# Patient Record
Sex: Female | Born: 1945 | Race: Black or African American | Hispanic: No | Smoking: Never smoker
Health system: Southern US, Community
[De-identification: ages and names within clinical notes are randomized; demographics above are authoritative.]

## PROBLEM LIST (undated history)

## (undated) DIAGNOSIS — G4733 Obstructive sleep apnea (adult) (pediatric): Secondary | ICD-10-CM

## (undated) DIAGNOSIS — T7840XA Allergy, unspecified, initial encounter: Secondary | ICD-10-CM

## (undated) DIAGNOSIS — C8290 Follicular lymphoma, unspecified, unspecified site: Secondary | ICD-10-CM

## (undated) DIAGNOSIS — I10 Essential (primary) hypertension: Secondary | ICD-10-CM

## (undated) DIAGNOSIS — G4730 Sleep apnea, unspecified: Secondary | ICD-10-CM

## (undated) DIAGNOSIS — D869 Sarcoidosis, unspecified: Secondary | ICD-10-CM

## (undated) DIAGNOSIS — H269 Unspecified cataract: Secondary | ICD-10-CM

## (undated) HISTORY — DX: Allergy, unspecified, initial encounter: T78.40XA

## (undated) HISTORY — DX: Unspecified cataract: H26.9

## (undated) HISTORY — DX: Sleep apnea, unspecified: G47.30

## (undated) HISTORY — DX: Obstructive sleep apnea (adult) (pediatric): G47.33

## (undated) HISTORY — DX: Follicular lymphoma, unspecified, unspecified site: C82.90

## (undated) HISTORY — DX: Sarcoidosis, unspecified: D86.9

## (undated) HISTORY — PX: ABDOMINAL HYSTERECTOMY: SHX81

## (undated) HISTORY — PX: LYMPH NODE BIOPSY: SHX201

## (undated) HISTORY — DX: Essential (primary) hypertension: I10

## (undated) HISTORY — PX: CARPAL TUNNEL RELEASE: SHX101

---

## 1976-06-22 HISTORY — PX: TUBAL LIGATION: SHX77

## 1981-06-22 HISTORY — PX: ABDOMINAL HYSTERECTOMY: SUR658

## 2000-11-24 ENCOUNTER — Encounter: Admission: RE | Admit: 2000-11-24 | Discharge: 2000-11-24 | Payer: Self-pay | Admitting: Family Medicine

## 2000-11-24 ENCOUNTER — Encounter: Payer: Self-pay | Admitting: Family Medicine

## 2001-02-08 ENCOUNTER — Other Ambulatory Visit: Admission: RE | Admit: 2001-02-08 | Discharge: 2001-02-08 | Payer: Self-pay | Admitting: Family Medicine

## 2001-02-23 ENCOUNTER — Other Ambulatory Visit: Admission: RE | Admit: 2001-02-23 | Discharge: 2001-02-23 | Payer: Self-pay | Admitting: *Deleted

## 2001-03-23 ENCOUNTER — Encounter (INDEPENDENT_AMBULATORY_CARE_PROVIDER_SITE_OTHER): Payer: Self-pay

## 2001-03-23 ENCOUNTER — Other Ambulatory Visit: Admission: RE | Admit: 2001-03-23 | Discharge: 2001-03-23 | Payer: Self-pay | Admitting: Gastroenterology

## 2002-01-12 ENCOUNTER — Encounter: Payer: Self-pay | Admitting: Family Medicine

## 2002-01-12 ENCOUNTER — Encounter: Admission: RE | Admit: 2002-01-12 | Discharge: 2002-01-12 | Payer: Self-pay | Admitting: Family Medicine

## 2002-08-14 ENCOUNTER — Encounter: Admission: RE | Admit: 2002-08-14 | Discharge: 2002-08-14 | Payer: Self-pay | Admitting: Family Medicine

## 2002-08-14 ENCOUNTER — Encounter: Payer: Self-pay | Admitting: Family Medicine

## 2002-12-28 ENCOUNTER — Encounter: Admission: RE | Admit: 2002-12-28 | Discharge: 2002-12-28 | Payer: Self-pay | Admitting: Family Medicine

## 2002-12-28 ENCOUNTER — Encounter: Payer: Self-pay | Admitting: Family Medicine

## 2003-06-06 ENCOUNTER — Encounter: Admission: RE | Admit: 2003-06-06 | Discharge: 2003-06-06 | Payer: Self-pay | Admitting: Family Medicine

## 2003-06-06 ENCOUNTER — Ambulatory Visit (HOSPITAL_BASED_OUTPATIENT_CLINIC_OR_DEPARTMENT_OTHER): Admission: RE | Admit: 2003-06-06 | Discharge: 2003-06-06 | Payer: Self-pay | Admitting: Family Medicine

## 2003-06-23 HISTORY — PX: OTHER SURGICAL HISTORY: SHX169

## 2003-12-20 ENCOUNTER — Encounter: Admission: RE | Admit: 2003-12-20 | Discharge: 2003-12-20 | Payer: Self-pay | Admitting: Family Medicine

## 2003-12-22 ENCOUNTER — Encounter: Admission: RE | Admit: 2003-12-22 | Discharge: 2003-12-22 | Payer: Self-pay | Admitting: Family Medicine

## 2004-01-04 ENCOUNTER — Encounter: Admission: RE | Admit: 2004-01-04 | Discharge: 2004-01-04 | Payer: Self-pay | Admitting: Family Medicine

## 2004-01-11 ENCOUNTER — Encounter: Admission: RE | Admit: 2004-01-11 | Discharge: 2004-01-11 | Payer: Self-pay | Admitting: Family Medicine

## 2004-01-22 ENCOUNTER — Ambulatory Visit (HOSPITAL_COMMUNITY): Admission: RE | Admit: 2004-01-22 | Discharge: 2004-01-22 | Payer: Self-pay | Admitting: General Surgery

## 2004-01-22 ENCOUNTER — Encounter (INDEPENDENT_AMBULATORY_CARE_PROVIDER_SITE_OTHER): Payer: Self-pay | Admitting: Specialist

## 2004-01-22 ENCOUNTER — Other Ambulatory Visit: Admission: RE | Admit: 2004-01-22 | Discharge: 2004-01-22 | Payer: Self-pay | Admitting: Hematology and Oncology

## 2004-01-31 ENCOUNTER — Ambulatory Visit (HOSPITAL_COMMUNITY): Admission: RE | Admit: 2004-01-31 | Discharge: 2004-01-31 | Payer: Self-pay | Admitting: Hematology and Oncology

## 2004-02-15 ENCOUNTER — Encounter: Admission: RE | Admit: 2004-02-15 | Discharge: 2004-02-15 | Payer: Self-pay | Admitting: Hematology and Oncology

## 2004-02-29 ENCOUNTER — Encounter (INDEPENDENT_AMBULATORY_CARE_PROVIDER_SITE_OTHER): Payer: Self-pay | Admitting: General Surgery

## 2004-02-29 ENCOUNTER — Encounter (INDEPENDENT_AMBULATORY_CARE_PROVIDER_SITE_OTHER): Payer: Self-pay | Admitting: *Deleted

## 2004-02-29 ENCOUNTER — Ambulatory Visit (HOSPITAL_COMMUNITY): Admission: RE | Admit: 2004-02-29 | Discharge: 2004-02-29 | Payer: Self-pay | Admitting: General Surgery

## 2004-02-29 ENCOUNTER — Ambulatory Visit (HOSPITAL_BASED_OUTPATIENT_CLINIC_OR_DEPARTMENT_OTHER): Admission: RE | Admit: 2004-02-29 | Discharge: 2004-02-29 | Payer: Self-pay | Admitting: General Surgery

## 2004-03-06 ENCOUNTER — Ambulatory Visit (HOSPITAL_COMMUNITY): Admission: RE | Admit: 2004-03-06 | Discharge: 2004-03-06 | Payer: Self-pay | Admitting: Hematology and Oncology

## 2004-03-07 ENCOUNTER — Other Ambulatory Visit: Admission: RE | Admit: 2004-03-07 | Discharge: 2004-03-07 | Payer: Self-pay | Admitting: Hematology and Oncology

## 2004-03-07 ENCOUNTER — Encounter (INDEPENDENT_AMBULATORY_CARE_PROVIDER_SITE_OTHER): Payer: Self-pay | Admitting: *Deleted

## 2004-03-10 ENCOUNTER — Ambulatory Visit (HOSPITAL_COMMUNITY): Admission: RE | Admit: 2004-03-10 | Discharge: 2004-03-10 | Payer: Self-pay | Admitting: Oncology

## 2004-04-25 ENCOUNTER — Ambulatory Visit: Payer: Self-pay | Admitting: Hematology and Oncology

## 2004-05-05 ENCOUNTER — Emergency Department (HOSPITAL_COMMUNITY): Admission: EM | Admit: 2004-05-05 | Discharge: 2004-05-05 | Payer: Self-pay | Admitting: Emergency Medicine

## 2004-06-10 ENCOUNTER — Ambulatory Visit: Payer: Self-pay | Admitting: Hematology and Oncology

## 2004-06-22 HISTORY — PX: PORT-A-CATH REMOVAL: SHX5289

## 2004-07-22 ENCOUNTER — Ambulatory Visit (HOSPITAL_COMMUNITY): Admission: RE | Admit: 2004-07-22 | Discharge: 2004-07-22 | Payer: Self-pay | Admitting: Hematology and Oncology

## 2004-08-08 ENCOUNTER — Ambulatory Visit: Payer: Self-pay | Admitting: Family Medicine

## 2004-08-20 ENCOUNTER — Ambulatory Visit: Payer: Self-pay | Admitting: Family Medicine

## 2004-08-20 ENCOUNTER — Ambulatory Visit: Payer: Self-pay | Admitting: Gastroenterology

## 2004-08-20 ENCOUNTER — Other Ambulatory Visit: Admission: RE | Admit: 2004-08-20 | Discharge: 2004-08-20 | Payer: Self-pay | Admitting: Family Medicine

## 2004-08-21 ENCOUNTER — Ambulatory Visit: Payer: Self-pay | Admitting: Hematology and Oncology

## 2004-09-02 ENCOUNTER — Ambulatory Visit: Payer: Self-pay | Admitting: Gastroenterology

## 2004-10-14 ENCOUNTER — Ambulatory Visit: Payer: Self-pay | Admitting: Hematology and Oncology

## 2004-10-17 ENCOUNTER — Ambulatory Visit (HOSPITAL_COMMUNITY): Admission: RE | Admit: 2004-10-17 | Discharge: 2004-10-17 | Payer: Self-pay | Admitting: Hematology and Oncology

## 2004-10-31 ENCOUNTER — Ambulatory Visit: Payer: Self-pay | Admitting: Psychology

## 2004-12-08 ENCOUNTER — Ambulatory Visit: Payer: Self-pay | Admitting: Family Medicine

## 2004-12-25 ENCOUNTER — Ambulatory Visit: Payer: Self-pay | Admitting: Hematology and Oncology

## 2005-02-16 ENCOUNTER — Encounter: Admission: RE | Admit: 2005-02-16 | Discharge: 2005-02-16 | Payer: Self-pay | Admitting: Hematology and Oncology

## 2005-02-25 ENCOUNTER — Ambulatory Visit (HOSPITAL_COMMUNITY): Admission: RE | Admit: 2005-02-25 | Discharge: 2005-02-25 | Payer: Self-pay | Admitting: Hematology and Oncology

## 2005-02-27 ENCOUNTER — Ambulatory Visit: Payer: Self-pay | Admitting: Hematology and Oncology

## 2005-03-04 ENCOUNTER — Ambulatory Visit (HOSPITAL_COMMUNITY): Admission: RE | Admit: 2005-03-04 | Discharge: 2005-03-04 | Payer: Self-pay | Admitting: Oncology

## 2005-03-13 ENCOUNTER — Encounter: Admission: RE | Admit: 2005-03-13 | Discharge: 2005-03-13 | Payer: Self-pay | Admitting: Hematology and Oncology

## 2005-04-02 ENCOUNTER — Encounter (INDEPENDENT_AMBULATORY_CARE_PROVIDER_SITE_OTHER): Payer: Self-pay | Admitting: Specialist

## 2005-04-02 ENCOUNTER — Ambulatory Visit (HOSPITAL_COMMUNITY): Admission: RE | Admit: 2005-04-02 | Discharge: 2005-04-02 | Payer: Self-pay | Admitting: Thoracic Surgery

## 2005-04-13 ENCOUNTER — Ambulatory Visit: Payer: Self-pay | Admitting: Family Medicine

## 2005-04-22 ENCOUNTER — Ambulatory Visit: Payer: Self-pay | Admitting: Hematology and Oncology

## 2005-05-07 ENCOUNTER — Ambulatory Visit: Payer: Self-pay | Admitting: Critical Care Medicine

## 2005-05-18 ENCOUNTER — Ambulatory Visit: Payer: Self-pay | Admitting: Critical Care Medicine

## 2005-06-25 ENCOUNTER — Ambulatory Visit: Payer: Self-pay | Admitting: Hematology and Oncology

## 2005-06-26 ENCOUNTER — Ambulatory Visit: Payer: Self-pay | Admitting: Critical Care Medicine

## 2005-07-13 ENCOUNTER — Ambulatory Visit (HOSPITAL_COMMUNITY): Admission: RE | Admit: 2005-07-13 | Discharge: 2005-07-13 | Payer: Self-pay | Admitting: Hematology and Oncology

## 2005-08-17 ENCOUNTER — Ambulatory Visit (HOSPITAL_COMMUNITY): Admission: RE | Admit: 2005-08-17 | Discharge: 2005-08-17 | Payer: Self-pay | Admitting: Hematology and Oncology

## 2005-10-23 ENCOUNTER — Ambulatory Visit: Payer: Self-pay | Admitting: Critical Care Medicine

## 2005-10-27 ENCOUNTER — Ambulatory Visit: Payer: Self-pay | Admitting: Family Medicine

## 2005-11-04 ENCOUNTER — Ambulatory Visit: Payer: Self-pay | Admitting: Hematology and Oncology

## 2005-11-09 ENCOUNTER — Ambulatory Visit (HOSPITAL_COMMUNITY): Admission: RE | Admit: 2005-11-09 | Discharge: 2005-11-09 | Payer: Self-pay | Admitting: Hematology and Oncology

## 2005-11-18 ENCOUNTER — Ambulatory Visit: Payer: Self-pay | Admitting: Family Medicine

## 2006-03-01 ENCOUNTER — Ambulatory Visit: Payer: Self-pay | Admitting: Hematology and Oncology

## 2006-03-03 LAB — CBC WITH DIFFERENTIAL/PLATELET
BASO%: 1.2 % (ref 0.0–2.0)
Eosinophils Absolute: 0.1 10*3/uL (ref 0.0–0.5)
MONO#: 0.4 10*3/uL (ref 0.1–0.9)
MONO%: 7.7 % (ref 0.0–13.0)
NEUT#: 2.4 10*3/uL (ref 1.5–6.5)
RBC: 4.48 10*6/uL (ref 3.70–5.32)
RDW: 14.2 % (ref 11.3–14.5)
WBC: 5.7 10*3/uL (ref 3.9–10.0)

## 2006-03-03 LAB — COMPREHENSIVE METABOLIC PANEL
AST: 17 U/L (ref 0–37)
Albumin: 4.4 g/dL (ref 3.5–5.2)
Alkaline Phosphatase: 73 U/L (ref 39–117)
BUN: 10 mg/dL (ref 6–23)
Potassium: 4.1 mEq/L (ref 3.5–5.3)
Sodium: 141 mEq/L (ref 135–145)
Total Bilirubin: 0.6 mg/dL (ref 0.3–1.2)

## 2006-03-05 ENCOUNTER — Ambulatory Visit (HOSPITAL_COMMUNITY): Admission: RE | Admit: 2006-03-05 | Discharge: 2006-03-05 | Payer: Self-pay | Admitting: Hematology and Oncology

## 2006-03-29 ENCOUNTER — Ambulatory Visit: Payer: Self-pay | Admitting: Critical Care Medicine

## 2006-04-15 ENCOUNTER — Ambulatory Visit: Payer: Self-pay | Admitting: Family Medicine

## 2006-05-04 ENCOUNTER — Encounter: Admission: RE | Admit: 2006-05-04 | Discharge: 2006-05-04 | Payer: Self-pay | Admitting: Hematology and Oncology

## 2006-08-13 ENCOUNTER — Encounter: Admission: RE | Admit: 2006-08-13 | Discharge: 2006-08-13 | Payer: Self-pay | Admitting: Rheumatology

## 2006-08-31 ENCOUNTER — Ambulatory Visit: Payer: Self-pay | Admitting: Hematology and Oncology

## 2006-09-03 LAB — CBC WITH DIFFERENTIAL/PLATELET
Basophils Absolute: 0.1 10*3/uL (ref 0.0–0.1)
Eosinophils Absolute: 0.1 10*3/uL (ref 0.0–0.5)
HCT: 36.2 % (ref 34.8–46.6)
HGB: 12 g/dL (ref 11.6–15.9)
LYMPH%: 45.5 % (ref 14.0–48.0)
MCV: 79.4 fL — ABNORMAL LOW (ref 81.0–101.0)
MONO%: 7.5 % (ref 0.0–13.0)
NEUT#: 2.9 10*3/uL (ref 1.5–6.5)
NEUT%: 43.9 % (ref 39.6–76.8)
Platelets: 408 10*3/uL — ABNORMAL HIGH (ref 145–400)
RBC: 4.56 10*6/uL (ref 3.70–5.32)

## 2006-09-03 LAB — COMPREHENSIVE METABOLIC PANEL
Alkaline Phosphatase: 75 U/L (ref 39–117)
BUN: 8 mg/dL (ref 6–23)
CO2: 26 mEq/L (ref 19–32)
Creatinine, Ser: 0.81 mg/dL (ref 0.40–1.20)
Glucose, Bld: 117 mg/dL — ABNORMAL HIGH (ref 70–99)
Total Bilirubin: 0.4 mg/dL (ref 0.3–1.2)

## 2006-09-03 LAB — LACTATE DEHYDROGENASE: LDH: 190 U/L (ref 94–250)

## 2006-09-07 ENCOUNTER — Ambulatory Visit (HOSPITAL_COMMUNITY): Admission: RE | Admit: 2006-09-07 | Discharge: 2006-09-07 | Payer: Self-pay | Admitting: Hematology and Oncology

## 2006-09-28 ENCOUNTER — Ambulatory Visit (HOSPITAL_COMMUNITY): Admission: RE | Admit: 2006-09-28 | Discharge: 2006-09-29 | Payer: Self-pay | Admitting: Orthopaedic Surgery

## 2006-11-18 ENCOUNTER — Ambulatory Visit: Payer: Self-pay | Admitting: Family Medicine

## 2006-11-19 DIAGNOSIS — I10 Essential (primary) hypertension: Secondary | ICD-10-CM | POA: Insufficient documentation

## 2006-11-19 DIAGNOSIS — G4733 Obstructive sleep apnea (adult) (pediatric): Secondary | ICD-10-CM

## 2006-11-30 ENCOUNTER — Telehealth: Payer: Self-pay | Admitting: Family Medicine

## 2007-02-25 ENCOUNTER — Ambulatory Visit: Payer: Self-pay | Admitting: Hematology and Oncology

## 2007-03-01 LAB — CBC WITH DIFFERENTIAL/PLATELET
BASO%: 0.7 % (ref 0.0–2.0)
Basophils Absolute: 0 10*3/uL (ref 0.0–0.1)
EOS%: 2 % (ref 0.0–7.0)
Eosinophils Absolute: 0.1 10*3/uL (ref 0.0–0.5)
HCT: 34.5 % — ABNORMAL LOW (ref 34.8–46.6)
HGB: 11.6 g/dL (ref 11.6–15.9)
LYMPH%: 47.4 % (ref 14.0–48.0)
MCH: 27.2 pg (ref 26.0–34.0)
MCHC: 33.8 g/dL (ref 32.0–36.0)
MCV: 80.5 fL — ABNORMAL LOW (ref 81.0–101.0)
MONO#: 0.5 10*3/uL (ref 0.1–0.9)
MONO%: 7.6 % (ref 0.0–13.0)
NEUT#: 2.5 10*3/uL (ref 1.5–6.5)
NEUT%: 42.3 % (ref 39.6–76.8)
Platelets: 353 10*3/uL (ref 145–400)
RBC: 4.28 10*6/uL (ref 3.70–5.32)
RDW: 13.8 % (ref 11.3–14.5)
WBC: 6 10*3/uL (ref 3.9–10.0)
lymph#: 2.8 10*3/uL (ref 0.9–3.3)

## 2007-03-01 LAB — COMPREHENSIVE METABOLIC PANEL
AST: 20 U/L (ref 0–37)
BUN: 11 mg/dL (ref 6–23)
Calcium: 8.9 mg/dL (ref 8.4–10.5)
Chloride: 105 mEq/L (ref 96–112)
Creatinine, Ser: 0.77 mg/dL (ref 0.40–1.20)
Total Bilirubin: 0.5 mg/dL (ref 0.3–1.2)

## 2007-03-01 LAB — LACTATE DEHYDROGENASE: LDH: 199 U/L (ref 94–250)

## 2007-03-04 ENCOUNTER — Ambulatory Visit (HOSPITAL_COMMUNITY): Admission: RE | Admit: 2007-03-04 | Discharge: 2007-03-04 | Payer: Self-pay | Admitting: Hematology and Oncology

## 2007-03-09 ENCOUNTER — Encounter: Payer: Self-pay | Admitting: Family Medicine

## 2007-05-06 ENCOUNTER — Encounter: Payer: Self-pay | Admitting: Family Medicine

## 2007-05-06 ENCOUNTER — Encounter: Admission: RE | Admit: 2007-05-06 | Discharge: 2007-05-06 | Payer: Self-pay | Admitting: Hematology and Oncology

## 2007-05-18 ENCOUNTER — Encounter (INDEPENDENT_AMBULATORY_CARE_PROVIDER_SITE_OTHER): Payer: Self-pay | Admitting: *Deleted

## 2007-06-01 DIAGNOSIS — D869 Sarcoidosis, unspecified: Secondary | ICD-10-CM

## 2007-06-01 DIAGNOSIS — C8590 Non-Hodgkin lymphoma, unspecified, unspecified site: Secondary | ICD-10-CM | POA: Insufficient documentation

## 2007-06-01 DIAGNOSIS — Z87898 Personal history of other specified conditions: Secondary | ICD-10-CM

## 2007-09-02 ENCOUNTER — Ambulatory Visit: Payer: Self-pay | Admitting: Hematology and Oncology

## 2007-09-02 ENCOUNTER — Ambulatory Visit (HOSPITAL_COMMUNITY): Admission: RE | Admit: 2007-09-02 | Discharge: 2007-09-02 | Payer: Self-pay | Admitting: Hematology and Oncology

## 2007-09-02 LAB — LACTATE DEHYDROGENASE: LDH: 182 U/L (ref 94–250)

## 2007-09-02 LAB — CBC WITH DIFFERENTIAL/PLATELET
Basophils Absolute: 0 10*3/uL (ref 0.0–0.1)
Eosinophils Absolute: 0 10*3/uL (ref 0.0–0.5)
HGB: 13.3 g/dL (ref 11.6–15.9)
MCV: 79.6 fL — ABNORMAL LOW (ref 81.0–101.0)
MONO%: 0.7 % (ref 0.0–13.0)
NEUT#: 6 10*3/uL (ref 1.5–6.5)
RDW: 14.7 % — ABNORMAL HIGH (ref 11.3–14.5)
lymph#: 1.6 10*3/uL (ref 0.9–3.3)

## 2007-09-02 LAB — COMPREHENSIVE METABOLIC PANEL
Albumin: 4.2 g/dL (ref 3.5–5.2)
BUN: 12 mg/dL (ref 6–23)
CO2: 29 mEq/L (ref 19–32)
Calcium: 9.7 mg/dL (ref 8.4–10.5)
Chloride: 103 mEq/L (ref 96–112)
Glucose, Bld: 131 mg/dL — ABNORMAL HIGH (ref 70–99)
Potassium: 4.1 mEq/L (ref 3.5–5.3)

## 2007-09-06 ENCOUNTER — Encounter: Payer: Self-pay | Admitting: Critical Care Medicine

## 2007-09-06 ENCOUNTER — Encounter: Payer: Self-pay | Admitting: Family Medicine

## 2007-09-16 ENCOUNTER — Ambulatory Visit: Payer: Self-pay | Admitting: Family Medicine

## 2007-09-16 ENCOUNTER — Other Ambulatory Visit: Admission: RE | Admit: 2007-09-16 | Discharge: 2007-09-16 | Payer: Self-pay | Admitting: Family Medicine

## 2007-09-16 ENCOUNTER — Encounter: Payer: Self-pay | Admitting: Family Medicine

## 2007-09-16 DIAGNOSIS — Z9889 Other specified postprocedural states: Secondary | ICD-10-CM

## 2007-09-16 DIAGNOSIS — Z8719 Personal history of other diseases of the digestive system: Secondary | ICD-10-CM

## 2007-09-16 DIAGNOSIS — N959 Unspecified menopausal and perimenopausal disorder: Secondary | ICD-10-CM | POA: Insufficient documentation

## 2007-09-16 LAB — CONVERTED CEMR LAB
Nitrite: NEGATIVE
Specific Gravity, Urine: 1.025
WBC Urine, dipstick: NEGATIVE

## 2007-09-21 ENCOUNTER — Encounter (INDEPENDENT_AMBULATORY_CARE_PROVIDER_SITE_OTHER): Payer: Self-pay | Admitting: *Deleted

## 2007-09-22 ENCOUNTER — Telehealth (INDEPENDENT_AMBULATORY_CARE_PROVIDER_SITE_OTHER): Payer: Self-pay | Admitting: *Deleted

## 2007-09-27 LAB — CONVERTED CEMR LAB
ALT: 26 units/L (ref 0–35)
Albumin: 4.1 g/dL (ref 3.5–5.2)
Alkaline Phosphatase: 76 units/L (ref 39–117)
BUN: 6 mg/dL (ref 6–23)
Calcium: 9.6 mg/dL (ref 8.4–10.5)
Eosinophils Relative: 1.5 % (ref 0.0–5.0)
GFR calc Af Amer: 94 mL/min
Glucose, Bld: 93 mg/dL (ref 70–99)
HCT: 37.6 % (ref 36.0–46.0)
Hemoglobin: 12.4 g/dL (ref 12.0–15.0)
Monocytes Absolute: 0.5 10*3/uL (ref 0.1–1.0)
Monocytes Relative: 6.3 % (ref 3.0–12.0)
Neutro Abs: 3.2 10*3/uL (ref 1.4–7.7)
Potassium: 3.8 meq/L (ref 3.5–5.1)
Total CHOL/HDL Ratio: 3.5
Total Protein: 7.5 g/dL (ref 6.0–8.3)
WBC: 7.4 10*3/uL (ref 4.5–10.5)

## 2007-09-28 ENCOUNTER — Encounter (INDEPENDENT_AMBULATORY_CARE_PROVIDER_SITE_OTHER): Payer: Self-pay | Admitting: *Deleted

## 2007-10-04 ENCOUNTER — Encounter: Payer: Self-pay | Admitting: Family Medicine

## 2007-10-04 ENCOUNTER — Encounter: Admission: RE | Admit: 2007-10-04 | Discharge: 2007-10-04 | Payer: Self-pay | Admitting: Family Medicine

## 2007-10-04 ENCOUNTER — Telehealth (INDEPENDENT_AMBULATORY_CARE_PROVIDER_SITE_OTHER): Payer: Self-pay | Admitting: *Deleted

## 2007-10-05 ENCOUNTER — Encounter (INDEPENDENT_AMBULATORY_CARE_PROVIDER_SITE_OTHER): Payer: Self-pay | Admitting: *Deleted

## 2007-10-14 ENCOUNTER — Ambulatory Visit: Payer: Self-pay | Admitting: Gastroenterology

## 2007-10-17 ENCOUNTER — Telehealth: Payer: Self-pay | Admitting: Gastroenterology

## 2008-03-05 ENCOUNTER — Ambulatory Visit: Payer: Self-pay | Admitting: Hematology and Oncology

## 2008-03-07 ENCOUNTER — Ambulatory Visit (HOSPITAL_COMMUNITY): Admission: RE | Admit: 2008-03-07 | Discharge: 2008-03-07 | Payer: Self-pay | Admitting: Hematology and Oncology

## 2008-03-07 LAB — COMPREHENSIVE METABOLIC PANEL
ALT: 34 U/L (ref 0–35)
AST: 31 U/L (ref 0–37)
Albumin: 4.2 g/dL (ref 3.5–5.2)
BUN: 10 mg/dL (ref 6–23)
CO2: 28 mEq/L (ref 19–32)
Calcium: 9.8 mg/dL (ref 8.4–10.5)
Chloride: 103 mEq/L (ref 96–112)
Potassium: 4 mEq/L (ref 3.5–5.3)

## 2008-03-07 LAB — CBC WITH DIFFERENTIAL/PLATELET
BASO%: 0.1 % (ref 0.0–2.0)
Basophils Absolute: 0 10*3/uL (ref 0.0–0.1)
EOS%: 0.1 % (ref 0.0–7.0)
HCT: 39.8 % (ref 34.8–46.6)
HGB: 13 g/dL (ref 11.6–15.9)
MCH: 26.8 pg (ref 26.0–34.0)
MONO#: 0.1 10*3/uL (ref 0.1–0.9)
NEUT%: 80.6 % — ABNORMAL HIGH (ref 39.6–76.8)
RDW: 15.1 % — ABNORMAL HIGH (ref 11.3–14.5)
WBC: 8.8 10*3/uL (ref 3.9–10.0)
lymph#: 1.6 10*3/uL (ref 0.9–3.3)

## 2008-03-14 ENCOUNTER — Encounter: Payer: Self-pay | Admitting: Critical Care Medicine

## 2008-04-06 ENCOUNTER — Ambulatory Visit: Payer: Self-pay | Admitting: Family Medicine

## 2008-04-06 DIAGNOSIS — J069 Acute upper respiratory infection, unspecified: Secondary | ICD-10-CM | POA: Insufficient documentation

## 2008-05-11 ENCOUNTER — Telehealth (INDEPENDENT_AMBULATORY_CARE_PROVIDER_SITE_OTHER): Payer: Self-pay | Admitting: *Deleted

## 2008-06-19 ENCOUNTER — Encounter: Admission: RE | Admit: 2008-06-19 | Discharge: 2008-06-19 | Payer: Self-pay | Admitting: Family Medicine

## 2008-06-20 ENCOUNTER — Encounter (INDEPENDENT_AMBULATORY_CARE_PROVIDER_SITE_OTHER): Payer: Self-pay | Admitting: *Deleted

## 2008-08-01 ENCOUNTER — Telehealth (INDEPENDENT_AMBULATORY_CARE_PROVIDER_SITE_OTHER): Payer: Self-pay | Admitting: *Deleted

## 2008-09-04 ENCOUNTER — Ambulatory Visit: Payer: Self-pay | Admitting: Hematology and Oncology

## 2008-09-06 ENCOUNTER — Ambulatory Visit (HOSPITAL_COMMUNITY): Admission: RE | Admit: 2008-09-06 | Discharge: 2008-09-06 | Payer: Self-pay | Admitting: Hematology and Oncology

## 2008-09-06 LAB — COMPREHENSIVE METABOLIC PANEL
ALT: 41 U/L — ABNORMAL HIGH (ref 0–35)
BUN: 8 mg/dL (ref 6–23)
CO2: 27 mEq/L (ref 19–32)
Calcium: 9.8 mg/dL (ref 8.4–10.5)
Chloride: 102 mEq/L (ref 96–112)
Creatinine, Ser: 0.76 mg/dL (ref 0.40–1.20)
Glucose, Bld: 178 mg/dL — ABNORMAL HIGH (ref 70–99)
Total Bilirubin: 0.7 mg/dL (ref 0.3–1.2)

## 2008-09-06 LAB — CBC WITH DIFFERENTIAL/PLATELET
Basophils Absolute: 0 10*3/uL (ref 0.0–0.1)
Eosinophils Absolute: 0 10*3/uL (ref 0.0–0.5)
HCT: 40.9 % (ref 34.8–46.6)
HGB: 13.3 g/dL (ref 11.6–15.9)
LYMPH%: 20.5 % (ref 14.0–49.7)
MCHC: 32.4 g/dL (ref 31.5–36.0)
MONO#: 0 10*3/uL — ABNORMAL LOW (ref 0.1–0.9)
NEUT#: 6.2 10*3/uL (ref 1.5–6.5)
NEUT%: 79.1 % — ABNORMAL HIGH (ref 38.4–76.8)
Platelets: 346 10*3/uL (ref 145–400)
WBC: 7.9 10*3/uL (ref 3.9–10.3)
lymph#: 1.6 10*3/uL (ref 0.9–3.3)

## 2008-09-06 LAB — LACTATE DEHYDROGENASE: LDH: 192 U/L (ref 94–250)

## 2008-09-13 ENCOUNTER — Encounter: Payer: Self-pay | Admitting: Critical Care Medicine

## 2008-11-21 ENCOUNTER — Ambulatory Visit: Payer: Self-pay | Admitting: Family Medicine

## 2008-11-21 DIAGNOSIS — L259 Unspecified contact dermatitis, unspecified cause: Secondary | ICD-10-CM

## 2009-02-18 ENCOUNTER — Telehealth (INDEPENDENT_AMBULATORY_CARE_PROVIDER_SITE_OTHER): Payer: Self-pay | Admitting: *Deleted

## 2009-03-26 ENCOUNTER — Ambulatory Visit: Payer: Self-pay | Admitting: Family Medicine

## 2009-03-26 ENCOUNTER — Encounter: Payer: Self-pay | Admitting: Family Medicine

## 2009-03-26 ENCOUNTER — Other Ambulatory Visit: Admission: RE | Admit: 2009-03-26 | Discharge: 2009-03-26 | Payer: Self-pay | Admitting: Family Medicine

## 2009-03-26 DIAGNOSIS — R5383 Other fatigue: Secondary | ICD-10-CM

## 2009-03-26 DIAGNOSIS — R5381 Other malaise: Secondary | ICD-10-CM | POA: Insufficient documentation

## 2009-04-01 ENCOUNTER — Telehealth (INDEPENDENT_AMBULATORY_CARE_PROVIDER_SITE_OTHER): Payer: Self-pay | Admitting: *Deleted

## 2009-04-01 LAB — CONVERTED CEMR LAB
ALT: 41 units/L — ABNORMAL HIGH (ref 0–35)
AST: 35 units/L (ref 0–37)
Albumin: 4 g/dL (ref 3.5–5.2)
Alkaline Phosphatase: 74 units/L (ref 39–117)
Basophils Relative: 3.9 % — ABNORMAL HIGH (ref 0.0–3.0)
Eosinophils Relative: 2.6 % (ref 0.0–5.0)
Folate: 10.1 ng/mL
GFR calc non Af Amer: 81.7 mL/min (ref >60)
Glucose, Bld: 93 mg/dL (ref 70–99)
HCT: 36.7 % (ref 36.0–46.0)
Hemoglobin: 12.3 g/dL (ref 12.0–15.0)
LDL Cholesterol: 105 mg/dL — ABNORMAL HIGH (ref 0–99)
Lymphocytes Relative: 49.8 % — ABNORMAL HIGH (ref 12.0–46.0)
Lymphs Abs: 2.4 10*3/uL (ref 0.7–4.0)
Monocytes Relative: 6.5 % (ref 3.0–12.0)
Neutro Abs: 1.8 10*3/uL (ref 1.4–7.7)
Potassium: 4.1 meq/L (ref 3.5–5.1)
RBC: 4.59 M/uL (ref 3.87–5.11)
Sodium: 140 meq/L (ref 135–145)
TSH: 1.24 microintl units/mL (ref 0.35–5.50)
Total CHOL/HDL Ratio: 3
VLDL: 17.2 mg/dL (ref 0.0–40.0)
WBC: 4.8 10*3/uL (ref 4.5–10.5)

## 2009-04-05 ENCOUNTER — Telehealth: Payer: Self-pay | Admitting: Family Medicine

## 2009-04-12 ENCOUNTER — Telehealth (INDEPENDENT_AMBULATORY_CARE_PROVIDER_SITE_OTHER): Payer: Self-pay | Admitting: *Deleted

## 2009-04-12 ENCOUNTER — Ambulatory Visit: Payer: Self-pay | Admitting: Family Medicine

## 2009-04-19 ENCOUNTER — Ambulatory Visit: Payer: Self-pay | Admitting: Family Medicine

## 2009-04-19 DIAGNOSIS — E538 Deficiency of other specified B group vitamins: Secondary | ICD-10-CM

## 2009-04-26 ENCOUNTER — Ambulatory Visit: Payer: Self-pay | Admitting: Family Medicine

## 2009-05-03 ENCOUNTER — Ambulatory Visit: Payer: Self-pay | Admitting: Family Medicine

## 2009-05-27 ENCOUNTER — Telehealth (INDEPENDENT_AMBULATORY_CARE_PROVIDER_SITE_OTHER): Payer: Self-pay | Admitting: *Deleted

## 2009-05-28 ENCOUNTER — Ambulatory Visit: Payer: Self-pay | Admitting: Hematology and Oncology

## 2009-05-30 ENCOUNTER — Ambulatory Visit (HOSPITAL_COMMUNITY): Admission: RE | Admit: 2009-05-30 | Discharge: 2009-05-30 | Payer: Self-pay | Admitting: Hematology and Oncology

## 2009-05-30 LAB — COMPREHENSIVE METABOLIC PANEL
ALT: 32 U/L (ref 0–35)
AST: 29 U/L (ref 0–37)
Alkaline Phosphatase: 82 U/L (ref 39–117)
BUN: 13 mg/dL (ref 6–23)
Calcium: 9.7 mg/dL (ref 8.4–10.5)
Creatinine, Ser: 0.91 mg/dL (ref 0.40–1.20)
Total Bilirubin: 0.4 mg/dL (ref 0.3–1.2)

## 2009-05-30 LAB — CBC WITH DIFFERENTIAL/PLATELET
BASO%: 0.9 % (ref 0.0–2.0)
Basophils Absolute: 0.1 10*3/uL (ref 0.0–0.1)
EOS%: 0.1 % (ref 0.0–7.0)
HCT: 41.3 % (ref 34.8–46.6)
HGB: 12.9 g/dL (ref 11.6–15.9)
LYMPH%: 19.5 % (ref 14.0–49.7)
MCH: 26.2 pg (ref 25.1–34.0)
MCHC: 31.3 g/dL — ABNORMAL LOW (ref 31.5–36.0)
MCV: 83.5 fL (ref 79.5–101.0)
MONO%: 1.4 % (ref 0.0–14.0)
NEUT%: 78.1 % — ABNORMAL HIGH (ref 38.4–76.8)

## 2009-05-30 LAB — LACTATE DEHYDROGENASE: LDH: 189 U/L (ref 94–250)

## 2009-06-04 ENCOUNTER — Ambulatory Visit: Payer: Self-pay | Admitting: Family Medicine

## 2009-06-05 ENCOUNTER — Telehealth (INDEPENDENT_AMBULATORY_CARE_PROVIDER_SITE_OTHER): Payer: Self-pay | Admitting: *Deleted

## 2009-06-05 LAB — CONVERTED CEMR LAB: Vitamin B-12: 522 pg/mL

## 2009-06-06 ENCOUNTER — Encounter: Payer: Self-pay | Admitting: Critical Care Medicine

## 2009-06-19 ENCOUNTER — Telehealth (INDEPENDENT_AMBULATORY_CARE_PROVIDER_SITE_OTHER): Payer: Self-pay | Admitting: *Deleted

## 2009-06-27 ENCOUNTER — Telehealth (INDEPENDENT_AMBULATORY_CARE_PROVIDER_SITE_OTHER): Payer: Self-pay | Admitting: *Deleted

## 2009-07-09 ENCOUNTER — Ambulatory Visit: Payer: Self-pay | Admitting: Family Medicine

## 2009-08-12 ENCOUNTER — Encounter: Admission: RE | Admit: 2009-08-12 | Discharge: 2009-08-12 | Payer: Self-pay | Admitting: Family Medicine

## 2009-08-19 ENCOUNTER — Ambulatory Visit: Payer: Self-pay | Admitting: Family Medicine

## 2009-09-06 ENCOUNTER — Ambulatory Visit: Payer: Self-pay | Admitting: Family Medicine

## 2009-10-07 ENCOUNTER — Ambulatory Visit: Payer: Self-pay | Admitting: Family Medicine

## 2009-11-06 ENCOUNTER — Ambulatory Visit: Payer: Self-pay | Admitting: Family Medicine

## 2009-11-06 LAB — CONVERTED CEMR LAB
ALT: 36 units/L — ABNORMAL HIGH (ref 0–35)
AST: 31 units/L (ref 0–37)
Albumin: 3.9 g/dL (ref 3.5–5.2)
BUN: 11 mg/dL (ref 6–23)
Chloride: 108 meq/L (ref 96–112)
Cholesterol: 170 mg/dL (ref 0–200)
Creatinine, Ser: 0.7 mg/dL (ref 0.4–1.2)
Folate: 10.6 ng/mL
GFR calc non Af Amer: 112.67 mL/min (ref >60)
Glucose, Bld: 100 mg/dL — ABNORMAL HIGH (ref 70–99)
LDL Cholesterol: 98 mg/dL (ref 0–99)
Potassium: 3.9 meq/L (ref 3.5–5.1)
Total Bilirubin: 0.3 mg/dL (ref 0.3–1.2)
Triglycerides: 80 mg/dL (ref 0.0–149.0)
VLDL: 16 mg/dL (ref 0.0–40.0)

## 2010-02-10 ENCOUNTER — Ambulatory Visit: Payer: Self-pay | Admitting: Family Medicine

## 2010-02-10 DIAGNOSIS — M25559 Pain in unspecified hip: Secondary | ICD-10-CM

## 2010-02-10 DIAGNOSIS — M545 Low back pain: Secondary | ICD-10-CM

## 2010-02-11 ENCOUNTER — Telehealth (INDEPENDENT_AMBULATORY_CARE_PROVIDER_SITE_OTHER): Payer: Self-pay | Admitting: *Deleted

## 2010-02-13 ENCOUNTER — Telehealth: Payer: Self-pay | Admitting: Family Medicine

## 2010-03-03 ENCOUNTER — Ambulatory Visit: Payer: Self-pay | Admitting: Hematology and Oncology

## 2010-03-05 LAB — COMPREHENSIVE METABOLIC PANEL
ALT: 43 U/L — ABNORMAL HIGH (ref 0–35)
Alkaline Phosphatase: 64 U/L (ref 39–117)
Sodium: 138 mEq/L (ref 135–145)
Total Bilirubin: 0.7 mg/dL (ref 0.3–1.2)
Total Protein: 7.7 g/dL (ref 6.0–8.3)

## 2010-03-05 LAB — LACTATE DEHYDROGENASE: LDH: 189 U/L (ref 94–250)

## 2010-03-05 LAB — CBC WITH DIFFERENTIAL/PLATELET
EOS%: 3.9 % (ref 0.0–7.0)
MCH: 26 pg (ref 25.1–34.0)
MCHC: 32.1 g/dL (ref 31.5–36.0)
MCV: 81.2 fL (ref 79.5–101.0)
MONO%: 7.3 % (ref 0.0–14.0)
RBC: 4.7 10*6/uL (ref 3.70–5.45)
RDW: 15.1 % — ABNORMAL HIGH (ref 11.2–14.5)

## 2010-03-11 ENCOUNTER — Encounter: Payer: Self-pay | Admitting: Critical Care Medicine

## 2010-06-30 ENCOUNTER — Telehealth (INDEPENDENT_AMBULATORY_CARE_PROVIDER_SITE_OTHER): Payer: Self-pay | Admitting: *Deleted

## 2010-07-12 ENCOUNTER — Other Ambulatory Visit: Payer: Self-pay | Admitting: Hematology and Oncology

## 2010-07-12 DIAGNOSIS — C8590 Non-Hodgkin lymphoma, unspecified, unspecified site: Secondary | ICD-10-CM

## 2010-07-22 NOTE — Progress Notes (Signed)
Summary: DOES SHE NEED FASTING LABS IN MARCH OR APRIL?  Phone Note Call from Patient Call back at [facility] Phone [phone]   Caller: Patient Summary of Call: PATIENT CALLED TO SET UP MONTHLY APPOINTMENTS FOR HER B-12 SHOTS---SHE SAID DR LOWNE MENTIONED THAT SHE MAY NEED TO COME IN EVERY SIX MONTHS TO DO LAB WORK SINCE SHE HAS BLOOD PRESSURE ISSUES  SHE WAS HERE FOR CPX ON 03/26/2009--DO I NEED TO ADD LAB ORDERS TO HER MARCH OR APRIL APPOINTMENT FOR HER B-12 SHOT?  SINCE SHE WORKS IN [place], I GAVE HER EARLY MORNING APPOINTMENTS FOR HER SHOT SO SHE IS ALREADY SET UP IF SHE NEEDS FASTING LABS Initial call taken by: Jerolyn Shin,  June 27, 2009 2:27 PM  Follow-up for Phone Call        dr lowne pls advise on any additional labs needed..............Marland KitchenFelecia Deloach CMA  June 27, 2009 2:43 PM   due for vitamin d 3 month from 06-05-09 268.9  Additional Follow-up for Phone Call Additional follow up Details #1::        ok to do everything in april  Additional Follow-up by: Loreen Freud DO,  June 27, 2009 3:27 PM    Additional Follow-up for Phone Call Additional follow up Details #2::    left message to call  office..........................Marland KitchenFelecia Deloach CMA  June 27, 2009 3:41 PM   Additional Follow-up for Phone Call Additional follow up Details #3:: Details for Additional Follow-up Action Taken: pt aware.....................Marland KitchenFelecia Deloach CMA  June 27, 2009 4:26 PM

## 2010-07-22 NOTE — Assessment & Plan Note (Signed)
Summary: 6 month roa//lch   Vital Signs:  Patient profile:   65 year old female Height:      63 inches Weight:      206 pounds BMI:     36.62 Pulse rate:   86 / minute Pulse rhythm:   regular BP sitting:   118 / 78  (left arm) Cuff size:   large  Vitals Entered By: Army Fossa CMA (Nov 06, 2009 8:18 AM) CC: Pt here for 6 month follow up and labs    History of Present Illness:  Hypertension follow-up      This is a 65 year old woman who presents for Hypertension follow-up.  The patient denies lightheadedness, urinary frequency, headaches, edema, impotence, rash, and fatigue.  The patient denies the following associated symptoms: chest pain, chest pressure, exercise intolerance, dyspnea, palpitations, syncope, leg edema, and pedal edema.  Compliance with medications (by patient report) has been near 100%.  The patient reports that dietary compliance has been fair.  The patient reports no exercise.  Adjunctive measures currently used by the patient include salt restriction.    Current Medications (verified): 1)  Amlodipine Besylate 5 Mg Tabs (Amlodipine Besylate) .... Take One Tablet Daily 2)  Labetalol Hcl 100 Mg Tabs (Labetalol Hcl) .Marland Kitchen.. 1 By Mouth Two Times A Day 3)  Caltrate 600 1500 Mg Tabs (Calcium Carbonate) .Marland Kitchen.. 1 By Mouth Two Times A Day 4)  Vitamin D3 2000 Unit Caps (Cholecalciferol) .Marland Kitchen.. 1 By Mouth Daily.  Allergies (verified): 1)  ! * Ivp Dye  Past History:  Past medical, surgical, family and social histories (including risk factors) reviewed for relevance to current acute and chronic problems.  Past Medical History: Reviewed history from 10/14/2007 and no changes required. Hypertension Follicular Lymphoma OSA Sarcoidosis Sleep apnea  Past Surgical History: Reviewed history from 03/26/2009 and no changes required. Carpal tunnel release Hysterectomy Tubal ligation PORTA CATH PLACED AND REMOVED LYMPH NODE BX--SUMMER 05x2 , 06 Arthroscopic R knee---  09/2006  Family History: Reviewed history from 09/16/2007 and no changes required. Family History MI --m Family History of CAD Female 1st degree relative AT 75-- F Family History Lung cancer--- fATHER Family History Diabetes 1st degree relative  Social History: Reviewed history from 09/16/2007 and no changes required. Alcohol use-no Occupation:[employer] Divorced Never Smoked Drug use-no Regular exercise-no  Review of Systems      See HPI  Physical Exam  General:  Well-developed,well-nourished,in no acute distress; alert,appropriate and cooperative throughout examination Lungs:  Normal respiratory effort, chest expands symmetrically. Lungs are clear to auscultation, no crackles or wheezes. Heart:  normal rate and no murmur.   Extremities:  No clubbing, cyanosis, edema, or deformity noted with normal full range of motion of all joints.   Psych:  Oriented X3 and normally interactive.     Impression & Recommendations:  Problem # 1:  HYPERTENSION (ICD-401.9)  Her updated medication list for this problem includes:    Amlodipine Besylate 5 Mg Tabs (Amlodipine besylate) .Marland Kitchen... Take one tablet daily    Labetalol Hcl 100 Mg Tabs (Labetalol hcl) .Marland Kitchen... 1 by mouth two times a day  Orders: Venipuncture (16109) TLB-B12 + Folate Pnl (60454_09811-B14/NWG) TLB-Lipid Panel (80061-LIPID) TLB-BMP (Basic Metabolic Panel-BMET) (80048-METABOL) TLB-Hepatic/Liver Function Pnl (80076-HEPATIC)  BP today: 118/78 Prior BP: 120/82 (03/26/2009)  Labs Reviewed: K+: 4.1 (03/26/2009) Creat: : 0.9 (03/26/2009)   Chol: 173 (03/26/2009)   HDL: 50.90 (03/26/2009)   LDL: 105 (03/26/2009)   TG: 86.0 (03/26/2009)  Problem # 2:  B12 DEFICIENCY (  ICD-266.2)  Orders: Venipuncture (04540) TLB-B12 + Folate Pnl (98119_14782-N56/OZH) TLB-Lipid Panel (80061-LIPID) TLB-BMP (Basic Metabolic Panel-BMET) (80048-METABOL) TLB-Hepatic/Liver Function Pnl (80076-HEPATIC)  Complete Medication List: 1)  Amlodipine  Besylate 5 Mg Tabs (Amlodipine besylate) .... Take one tablet daily 2)  Labetalol Hcl 100 Mg Tabs (Labetalol hcl) .Marland Kitchen.. 1 by mouth two times a day 3)  Caltrate 600 1500 Mg Tabs (Calcium carbonate) .Marland Kitchen.. 1 by mouth two times a day 4)  Vitamin D3 2000 Unit Caps (Cholecalciferol) .Marland Kitchen.. 1 by mouth daily.

## 2010-07-22 NOTE — Assessment & Plan Note (Signed)
Summary: B-12 SHOT----WORKS IN W/S///[facility]   Nurse Visit   Allergies: 1)  ! * Ivp Dye  Medication Administration  Injection # 1:    Medication: Vit B12 1000 mcg    Diagnosis: B12 DEFICIENCY (ICD-266.2)    Route: IM    Site: L deltoid    Exp Date: 03/2011    Lot #: [id number]    Mfr: American Regent    Patient tolerated injection without complications    Given by: Floydene Flock (September 06, 2009 10:16 AM)  Orders Added: 1)  Admin of Therapeutic Inj  intramuscular or subcutaneous [96372] 2)  Vit B12 1000 mcg [J3420]   Medication Administration  Injection # 1:    Medication: Vit B12 1000 mcg    Diagnosis: B12 DEFICIENCY (ICD-266.2)    Route: IM    Site: L deltoid    Exp Date: 03/2011    Lot #: [id number]    Mfr: American Regent    Patient tolerated injection without complications    Given by: Floydene Flock (September 06, 2009 10:16 AM)  Orders Added: 1)  Admin of Therapeutic Inj  intramuscular or subcutaneous [96372] 2)  Vit B12 1000 mcg [J3420]

## 2010-07-22 NOTE — Assessment & Plan Note (Signed)
Summary: B-12//PH   Nurse Visit   Allergies: 1)  ! * Ivp Dye  Medication Administration  Injection # 1:    Medication: Vit B12 1000 mcg    Diagnosis: B12 DEFICIENCY (ICD-266.2)    Route: IM    Site: R deltoid    Exp Date: 07/2011    Lot #: [id number]    Mfr: American Regent    Patient tolerated injection without complications    Given by: Army Fossa CMA (October 07, 2009 8:13 AM)  Orders Added: 1)  Admin of Therapeutic Inj  intramuscular or subcutaneous [96372] 2)  Vit B12 1000 mcg [J3420] Prescriptions: LABETALOL HCL 100 MG TABS (LABETALOL HCL) 1 by mouth two times a day  #60 x 0   Entered by:   Army Fossa CMA   Authorized by:   Loreen Freud DO   Signed by:   Army Fossa CMA on 10/07/2009   Method used:   [facility]  [address] [id number]       [address]       [place], [place]  [address]       Ph: [phone] or [phone]       Fax: [phone]   RxID:   [id number]

## 2010-07-22 NOTE — Progress Notes (Signed)
Summary: poss reaction to Shingles Vaccine  Phone Note Call from Patient   Caller: Patient Details for Reason: poss infection at injection site Summary of Call: rcvd mssg from pt stated she rcv'd shingles vacicne and the site is red, swollen and pusey. c/b [phone]      Almeta Monas CMA [facility])  February 13, 2010 3:25 PM   Lmtc on VM for a return call.     Almeta Monas CMA [name])  February 13, 2010 3:26 PM  spk with pt and she says she has a quarter sized are that is sore, and pusey and swollen with redness. pt wants to come by in the morning to have looked at on her way to work. No fever, this was her first shingles vaccine.     Almeta Monas CMA [name])  February 13, 2010 3:53 PM    Follow-up for Phone Call        ok to have one of cma' s look at it Follow-up by: Loreen Freud DO,  February 13, 2010 4:03 PM  Additional Follow-up for Phone Call Additional follow up Details #1::        Pt made aware       Almeta Monas CMA [name])  February 13, 2010 4:38 PM

## 2010-07-22 NOTE — Assessment & Plan Note (Signed)
Summary: pain in right hip and lower back//lch   Vital Signs:  Patient profile:   65 year old female Weight:      206 pounds Temp:     98.1 degrees F oral Pulse rate:   80 / minute BP sitting:   122 / 84  (right arm)  Vitals Entered By: Almeta Monas CMA [name]) (February 10, 2010 1:59 PM) CC: R hip pain,low back pain   History of Present Illness:  Injury      This is a 65 year old woman who presents with An injury.  The symptoms began 4 weeks ago.  Pt here with 1 month hx R hip pain radiatingto back and r thigh.  No known injury.  It came on gradually and has progressively gotten worse.  The patient reports injury to the right hip, but denies injury to the head, face, neck, left arm, right arm, left elbow, right elbow, left forearm, right forearm, chest, back, abdomen, left hip, left thigh, right thigh, left knee, right knee, left leg, right leg, left ankle, right ankle, left foot, and right foot.  The patient denies swelling, redness, tenderness, increased warmth deformity, blood loss, numbness, weakness, loss of sensation, coolness of extremity, and loss of consciousness.  Screening for risk of abuse was negative.  She tried aleve with no relief.   Current Medications (verified): 1)  Amlodipine Besylate 5 Mg Tabs (Amlodipine Besylate) .... Take One Tablet Daily 2)  Labetalol Hcl 100 Mg Tabs (Labetalol Hcl) .Marland Kitchen.. 1 By Mouth Two Times A Day 3)  Caltrate 600 1500 Mg Tabs (Calcium Carbonate) .Marland Kitchen.. 1 By Mouth Two Times A Day 4)  Vitamin D3 2000 Unit Caps (Cholecalciferol) .Marland Kitchen.. 1 By Mouth Daily. 5)  Flexeril 10 Mg Tabs (Cyclobenzaprine Hcl) .Marland Kitchen.. 1 By Mouth Three Times A Day As Needed 6)  Ultram 50 Mg Tabs (Tramadol Hcl) .Marland Kitchen.. 1 By Mouth Every 6 Hours As Needed  Allergies (verified): 1)  ! * Ivp Dye  Past History:  Past medical, surgical, family and social histories (including risk factors) reviewed for relevance to current acute and chronic problems.  Past Medical History: Reviewed  history from 10/14/2007 and no changes required. Hypertension Follicular Lymphoma OSA Sarcoidosis Sleep apnea  Past Surgical History: Reviewed history from 03/26/2009 and no changes required. Carpal tunnel release Hysterectomy Tubal ligation PORTA CATH PLACED AND REMOVED LYMPH NODE BX--SUMMER 05x2 , 06 Arthroscopic R knee--- 09/2006  Family History: Reviewed history from 09/16/2007 and no changes required. Family History MI --m Family History of CAD Female 1st degree relative AT 16-- F Family History Lung cancer--- fATHER Family History Diabetes 1st degree relative  Social History: Reviewed history from 09/16/2007 and no changes required. Alcohol use-no Occupation:[employer] Divorced Never Smoked Drug use-no Regular exercise-no  Review of Systems      See HPI  Physical Exam  General:  Well-developed,well-nourished,in no acute distress; alert,appropriate and cooperative throughout examination Msk:  normal ROM.   no joint tenderness with palpation of R hip --only with movement and weight bearing Extremities:  No clubbing, cyanosis, edema, or deformity noted with normal full range of motion of all joints.   Neurologic:  alert & oriented X3, strength normal in all extremities, and DTRs symmetrical and normal.   Skin:  Intact without suspicious lesions or rashes Psych:  Oriented X3 and normally interactive.     Impression & Recommendations:  Problem # 1:  HIP PAIN, RIGHT (ICD-719.45)  Her updated medication list for this problem includes:  Flexeril 10 Mg Tabs (Cyclobenzaprine hcl) .Marland Kitchen... 1 by mouth three times a day as needed    Ultram 50 Mg Tabs (Tramadol hcl) .Marland Kitchen... 1 by mouth every 6 hours as needed  Orders: T-Hip Comp Right Min 2 views (73510TC) T-Lumbar Spine 2 Views (72100TC)  Discussed use of medications, application of heat or cold, and exercises.   Problem # 2:  BACK PAIN, LUMBAR (ICD-724.2)  Her updated medication list for this problem includes:     Flexeril 10 Mg Tabs (Cyclobenzaprine hcl) .Marland Kitchen... 1 by mouth three times a day as needed    Ultram 50 Mg Tabs (Tramadol hcl) .Marland Kitchen... 1 by mouth every 6 hours as needed  Orders: T-Hip Comp Right Min 2 views (73510TC) T-Lumbar Spine 2 Views (72100TC)  Discussed use of moist heat or ice, modified activities, medications, and stretching/strengthening exercises. Back care instructions given. To be seen in 2 weeks if no improvement; sooner if worsening of symptoms.   Complete Medication List: 1)  Amlodipine Besylate 5 Mg Tabs (Amlodipine besylate) .... Take one tablet daily 2)  Labetalol Hcl 100 Mg Tabs (Labetalol hcl) .Marland Kitchen.. 1 by mouth two times a day 3)  Caltrate 600 1500 Mg Tabs (Calcium carbonate) .Marland Kitchen.. 1 by mouth two times a day 4)  Vitamin D3 2000 Unit Caps (Cholecalciferol) .Marland Kitchen.. 1 by mouth daily. 5)  Flexeril 10 Mg Tabs (Cyclobenzaprine hcl) .Marland Kitchen.. 1 by mouth three times a day as needed 6)  Ultram 50 Mg Tabs (Tramadol hcl) .Marland Kitchen.. 1 by mouth every 6 hours as needed Prescriptions: ULTRAM 50 MG TABS (TRAMADOL HCL) 1 by mouth every 6 hours as needed  #30 x 0   Entered and Authorized by:   Loreen Freud DO   Signed by:   Loreen Freud DO on 02/10/2010   Method used:   [facility]  [address] [id number]       [address]       [place], [place]  [address]       Ph: [phone] or [phone]       Fax: [phone]   RxID:   [id number] FLEXERIL 10 MG TABS (CYCLOBENZAPRINE HCL) 1 by mouth three times a day as needed  #30 x 0   Entered and Authorized by:   Loreen Freud DO   Signed by:   Loreen Freud DO on 02/10/2010   Method used:   [facility]        [facility]  [address] [id number]       [address]       [place], [place]  [address]       Ph: [phone] or [phone]       Fax: [phone]   RxID:   [id number]   Appended Document: Orders Update     Clinical Lists Changes  Orders: Added new Service  order of Zoster (Shingles) Vaccine Live [phone]) - Signed Added new Service order of Admin 1st Vaccine (70350) - Signed Observations: Added new observation of ZOSTAVAX VIS: 04/03/05 given February 10, 2010. (02/10/2010 15:20) Added new observation of ZOSTAVAX LOT: [id number] (02/10/2010 15:20) Added new observation of ZOSTAVAX EXP: 02/06/2011 (02/10/2010 15:20) Added new observation of ZOSTAVAX BY: Almeta Monas CMA (AAMA) (02/10/2010 15:20) Added new observation of ZOSTAVAX RTE: [place] (02/10/2010 15:20) Added new observation of ZOSTAVAXDOSE: 0.5 ml (02/10/2010 15:20) Added new observation of ZOSTAVAX MFR: Merck (02/10/2010 15:20)  Added new observation of ZOSTAVAXSITE: right deltoid (02/10/2010 15:20) Added new observation of ZOSTAVAX: Zostavax (02/10/2010 15:20)       Immunizations Administered:  Zostavax # 1:    Vaccine Type: Zostavax    Site: right deltoid    Mfr: Merck    Dose: 0.5 ml    Route: [place]    Given by: Almeta Monas CMA (AAMA)    Exp. Date: 02/06/2011    Lot #: [id number]    VIS given: 04/03/05 given February 10, 2010.

## 2010-07-22 NOTE — Assessment & Plan Note (Signed)
Summary: B-12 SHOT---WORKS IN W/S///SPH   Nurse Visit   Allergies: 1)  ! * Ivp Dye  Medication Administration  Injection # 1:    Medication: Vit B12 1000 mcg    Diagnosis: B12 DEFICIENCY (ICD-266.2)    Route: IM    Site: L deltoid    Exp Date: 04/2011    Lot #: [id number]    Mfr: American Regent    Patient tolerated injection without complications    Given by: Floydene Flock (August 19, 2009 8:46 AM)  Orders Added: 1)  Admin of Therapeutic Inj  intramuscular or subcutaneous [96372] 2)  Vit B12 1000 mcg [J3420]   Medication Administration  Injection # 1:    Medication: Vit B12 1000 mcg    Diagnosis: B12 DEFICIENCY (ICD-266.2)    Route: IM    Site: L deltoid    Exp Date: 04/2011    Lot #: [id number]    Mfr: American Regent    Patient tolerated injection without complications    Given by: Floydene Flock (August 19, 2009 8:46 AM)  Orders Added: 1)  Admin of Therapeutic Inj  intramuscular or subcutaneous [96372] 2)  Vit B12 1000 mcg [J3420]

## 2010-07-22 NOTE — Letter (Signed)
Summary: [facility]  [facility]   Imported By: Lanelle Bal 03/31/2010 14:19:27  _____________________________________________________________________  External Attachment:    Type:   Image     Comment:   External Document

## 2010-07-22 NOTE — Progress Notes (Signed)
Summary: x-ray   Phone Note Outgoing Call   Call placed by: [facility] CMA,  February 11, 2010 12:35 PM Details for Reason: HIP - COMPLETE 2+ VIEW -some arthritic changes only LUMBAR SPINE - 2-3 VIEW--normal if symptoms do not improve----consider PT vs MRI Summary of Call: left message to call office............Marland KitchenFelecia Deloach CMA  February 11, 2010 12:37 PM   lmtcb.Harold Barban  February 12, 2010 11:44 AM  Patient retuned call, lmtcb.Marland KitchenMarland KitchenHarold Barban  February 12, 2010 1:30 PM  Follow-up for Phone Call        DISCUSS WITH PATIENT...........Marland KitchenFelecia Deloach CMA  February 12, 2010 3:38 PM

## 2010-07-22 NOTE — Letter (Signed)
Summary: [facility]  [facility]   Imported By: Sherian Rein 06/28/2009 09:14:15  _____________________________________________________________________  External Attachment:    Type:   Image     Comment:   External Document

## 2010-07-22 NOTE — Assessment & Plan Note (Signed)
Summary: B-12 INJ----WORKS IN W/S///SPH   Nurse Visit   Allergies: 1)  ! * Ivp Dye  Medication Administration  Injection # 1:    Medication: Vit B12 1000 mcg    Diagnosis: B12 DEFICIENCY (ICD-266.2)    Route: IM    Site: L deltoid    Exp Date: 03/23/2011    Lot #: [id number]    Mfr: American Regent    Patient tolerated injection without complications    Given by: Jeremy Johann CMA (July 09, 2009 8:50 AM)  Orders Added: 1)  Admin of Therapeutic Inj  intramuscular or subcutaneous [96372] 2)  Vit B12 1000 mcg [J3420]

## 2010-07-24 NOTE — Progress Notes (Signed)
Summary: refill  Phone Note Refill Request   Refills Requested: Medication #1:  LABETALOL HCL 100 MG TABS 1 by mouth two times a day   Supply Requested: 3 months [facility]...Marland KitchenMarland KitchenFelecia Deloach CMA  June 30, 2010 11:21 AM      Prescriptions: LABETALOL HCL 100 MG TABS (LABETALOL HCL) 1 by mouth two times a day  #180 x 0   Entered by:   Jeremy Johann CMA   Authorized by:   Loreen Freud DO   Signed by:   Jeremy Johann CMA on 06/30/2010   Method used:   [facility] ...       [facility]  [address] [phone][facility] ([facility]ail)       [address]       [place], [place]  [id number]       Ph: [phone] or [phone]       Fax: [phone]   RxID:   [id number]

## 2010-07-31 ENCOUNTER — Other Ambulatory Visit: Payer: Self-pay | Admitting: Family Medicine

## 2010-07-31 DIAGNOSIS — Z1239 Encounter for other screening for malignant neoplasm of breast: Secondary | ICD-10-CM

## 2010-08-14 ENCOUNTER — Ambulatory Visit
Admission: RE | Admit: 2010-08-14 | Discharge: 2010-08-14 | Disposition: A | Discharge status: Home / Self Care | Payer: BC Managed Care – PPO | Source: Ambulatory Visit | Attending: Family Medicine | Admitting: Family Medicine

## 2010-08-14 DIAGNOSIS — Z1239 Encounter for other screening for malignant neoplasm of breast: Secondary | ICD-10-CM

## 2010-09-24 ENCOUNTER — Other Ambulatory Visit: Payer: Self-pay | Admitting: Family Medicine

## 2010-09-25 ENCOUNTER — Telehealth: Payer: Self-pay | Admitting: Family Medicine

## 2010-09-25 ENCOUNTER — Other Ambulatory Visit: Payer: Self-pay | Admitting: Family Medicine

## 2010-09-25 MED ORDER — AMLODIPINE BESYLATE 5 MG PO TABS: 5.0000 mg | ORAL_TABLET | Freq: Every day | ORAL | Status: DC

## 2010-09-25 NOTE — Telephone Encounter (Signed)
Patient needs refill amlodipine - patient wants 90 day supply [facility]  [address]

## 2010-11-07 NOTE — Op Note (Signed)
Candice Bennett, Candice Bennett NO.:  [id number]   MEDICAL RECORD NO.:  [id number]          PATIENT TYPE:  OIB   LOCATION:  5033                         FACILITY:  [facility]   PHYSICIAN:  Lubertha Basque. Dalldorf, M.D.DATE OF BIRTH:  12-04-1945   DATE OF PROCEDURE:  09/28/2006  DATE OF DISCHARGE:                               OPERATIVE REPORT   PREOP DIAGNOSIS:  1. Right knee torn medial meniscus.  2. Right knee degenerative joint disease.   POSTOPERATIVE DIAGNOSES:  1. Right knee torn medial meniscus.  2. Right knee degenerative joint disease.   PROCEDURE:  1. Right knee partial meniscectomy.  2. Right knee abrasion chondroplasty.   ANESTHESIA:  General.   ATTENDING SURGEON:  Lubertha Basque. Jerl Santos, M.D.   ASSISTANT:  Lindwood Qua, P.A.   INDICATIONS FOR PROCEDURE:  The patient is a 65 year old woman with long  history of right knee pain and swelling.  This has persisted despite  oral anti-inflammatories and injections.  She has undergone an MRI scan  which shows a medial meniscus tear, and some early degeneration.  She  has pain which some inability to walk and rest and she is offered an  arthroscopy.  Informed operative consent was attained after discussion  of the possible complications of reaction to anesthesia and infection.   SUMMARY OF FINDINGS AND PROCEDURE:  Under general anesthesia an  arthroscopy of the right knee was performed.  The suprapatellar pouch  was benign while the patellofemoral joint exhibited some grade 3  chondromalacia mostly on the medial aspect.  A thorough chondroplasty  was done.  The medial compartment exhibited some chondromalacia of the  medial femoral condyle and about a quarter-size area with flaps of  articular cartilage.  A thorough chondroplasty was done with abrasion to  bleeding bone centrally at a small grade 4 area.  There was a small  medial meniscus tear addressed with a 5% partial meniscectomy using the  baskets and shaver.   ACL appeared intact.  Lateral compartment exhibited  no evidence of meniscal or articular cartilage injury.   DESCRIPTION OF PROCEDURE:  The patient was taken to the operating suite  where general anesthetic was applied without difficulty.  She was  positioned supine and then prepped and draped in a normal sterile  fashion.  After the administration of IV Kefzol an arthroscopy of the  right knee was performed through a total of two portals.  The findings  were as noted above; and the procedure consisted of the 5% partial  meniscectomy, followed by chondroplasty in two compartments with  abrasion to bleeding bone in one small area on the medial femoral  condyle.  The knee was thoroughly irrigated at the end of the case  followed by placement of Marcaine with epinephrine and morphine.  We did  inject 80 mg of Depo as well.  Adaptic was applied to the portals  followed by the dry gauze and a loose Ace wrap.  Estimated blood loss  and intraoperative fluids can be obtained from the anesthesia records.   DISPOSITION:  The patient was extubated in the operating room and taken  to recovery room in stable addition.  She was to be admitted at least  for overnight observation due to her sleep apnea; and will likely be  discharged home in the morning.      Lubertha Basque Jerl Santos, M.D.  Electronically Signed     PGD/MEDQ  D:  09/28/2006  T:  09/28/2006  Job:  [id number]

## 2010-11-07 NOTE — Op Note (Signed)
NAMEJAEDYN, MARRUFO                       ACCOUNT NO.:  [id number]   MEDICAL RECORD NO.:  [id number]                   PATIENT TYPE:  SPE   LOCATION:  DFTL                                 FACILITY:  [facility]   PHYSICIAN:  Sharlet Salina T. Hoxworth, M.D.          DATE OF BIRTH:  [birth year]   DATE OF PROCEDURE:  01/22/2004  DATE OF DISCHARGE:                                 OPERATIVE REPORT   PREOPERATIVE DIAGNOSIS:  Cervical lymphadenopathy.   POSTOPERATIVE DIAGNOSES:  Cervical lymphadenopathy.   OPERATION:  Left cervical lymph node biopsy.   SURGEON:  Lorne Skeens. Hoxworth, M.D.   ANESTHESIA:  General.   INDICATIONS FOR PROCEDURE:  The patient is a 65 year old black female who on  an MRI of the brain for unrelated problem, was noted to have cervical  adenopathy.  A follow-up CT scan has described extensive adenopathy up 2.0  cm in diameter in the anterior and posterior left neck and supraclavicular  area as well.  On careful examination in the office, I was able to feel a  small mass up near the angle of the jaw, but really not extensive  adenopathy; however, with this finding on CT scan, I have discussed with the  patient that we felt it is prudent to proceed with a cervical lymph node  biopsy.  The risks of bleeding and infection were discussed and understood.  The patient is brought to the operating room for this procedure.   DESCRIPTION OF PROCEDURE:  The patient was brought to the operating room and  placed in the supine position on the operating room table.  Laryngeal mask  general anesthesia was induced.  The left neck was widely sterilely prepped  and draped.  The area where I felt there was a likely palpable abnormality,  was fairly high in the left neck and an approximately 2.0 to 3.0 cm incision  was made here in a skin crease transversely and dissection was carried down  through the subcutaneous and platysma with cautery.  The dissection was  carried down onto the deep  cervical fascia which was incised, and I  dissected directly down onto the area I was able to palpate; however, on  inspection this appeared to be a minor salivary gland rather than lymph  node.  A tiny superficial biopsy of this was taken to confirm this, which  indeed showed salivary gland.  I did not feel any adenopathy in that area  locally.  I therefore decided to explore the neck along the carotid sheath  where adenopathy was described.  Through the same incision I was able to  dissect down to the sternocleidomastoid muscle and then identify the carotid  sheath which was opened over about a 3.0 cm to 4.0 cm length, down more  inferiorly into the neck.  The jugular vein, vagus nerve and carotid artery  were identified and carefully protected over that distance.  Dissection  within the sheath did reveal  one somewhat prominent node about 1.5 cm in the  greatest dimension which was completely sharply excised and sent in saline  for a lymph node workup.  I carefully palpated through this incision all  along the carotid sheath submandibular area and back along the posterior  neck and really did not feel any other palpable adenopathy whatsoever.  At  this point I felt that we had obtained the best sampling possible.  I did  then infiltrate the subcutaneous and soft tissue with Marcaine.  The  platysma was closed with interrupted #4-0 Monocryl.  The skin  was closed with subcuticular #4-0 Monocryl and Steri-Strips.  The sponge and  needle counts were correct.  A dry dressing was applied.  The patient was taken to the recovery room in good condition.                                               Lorne Skeens. Hoxworth, M.D.    Tory Emerald  D:  01/22/2004  T:  01/22/2004  Job:  [id number]

## 2010-11-07 NOTE — Discharge Summary (Signed)
NAMEWILLMA, OBANDO NO.:  [id number]   MEDICAL RECORD NO.:  [id number]          PATIENT TYPE:  OIB   LOCATION:  5033                         FACILITY:  [facility]   PHYSICIAN:  Lubertha Basque. Dalldorf, M.D.DATE OF BIRTH:  1945-10-23   DATE OF ADMISSION:  09/28/2006  DATE OF DISCHARGE:  09/29/2006                               DISCHARGE SUMMARY   ADMITTING DIAGNOSES:  1. Right knee torn medial meniscus.  2. Hypertension.   DISCHARGE DIAGNOSES:  1. Right knee torn medial meniscus  2. Hypertension.   OPERATION:  Right knee arthroscopy.   BRIEF HISTORY:  Ms. Trower is a 65 year old black female patient known to  our practice who has had discomfort in her right knee, and we treated  her conservatively with corticosteroid injection anti-inflammatory  medication, and she was continued discomfort.  An MRI scan has shown the  suggestion of a torn medial meniscus, and we discussed treatment  options, that being a knee arthroscopy.  Admission was required due to  history of sleep apnea.   PERTINENT LABORATORY AND X-RAY FINDINGS:  WBCs 7.1, hemoglobin 12.1,  platelets 341.  Other indices normal.  EKG - normal sinus rhythm.  Chest  x-ray - no active disease.   COURSE IN HOSPITAL:  She was admitted postoperatively, placed on a  variety of p.o. and IM analgesics for pain, IV Ancef 1 gram q.8h. x3  doses, and due to the fact that she had a history of sleep apnea, she  was kept in for monitoring overnight with pulse oximetry and also the  use of a CPAP machine.  The first day postoperatively, her vital signs  were stable.  Her lungs were clear.  She slept well that night.  The  wound was benign.  No sign of infection or irritation.  Dressing was  changed, and she was discharged home.   CONDITION ON DISCHARGE:  Improved.   FOLLOW UP:  She is given a prescription for Tylox #40, 1 or 2 q.4-6h.  p.r.n. pain.  She will continue on her labetalol 100 mg one p.o. b.i.d.,  ice and  elevation to her knee.  May walk and weight bear as tolerated.  May change her dressing daily.  A low-sodium diet.  Return to our office  in 7 days for postoperative follow-up.      Lindwood Qua, P.A.      Lubertha Basque Jerl Santos, M.D.  Electronically Signed    MC/MEDQ  D:  09/29/2006  T:  09/29/2006  Job:  [id number]

## 2010-11-07 NOTE — Op Note (Signed)
Candice Bennett, Candice Bennett                       ACCOUNT NO.:  [id number]   MEDICAL RECORD NO.:  [id number]                   PATIENT TYPE:  AMB   LOCATION:  DSC                                  FACILITY:  [facility]   PHYSICIAN:  Sharlet Salina T. Hoxworth, M.D.          DATE OF BIRTH:  12/16/1945   DATE OF PROCEDURE:  02/29/2004  DATE OF DISCHARGE:                                 OPERATIVE REPORT   PREOPERATIVE DIAGNOSIS:  Lymphadenopathy.   POSTOPERATIVE DIAGNOSIS:  Lymphadenopathy.   OPERATION PERFORMED:  Left posterior cervical lymph node biopsy.   SURGEON:  Lorne Skeens. Hoxworth, M.D.   ANESTHESIA:  LMA general.   INDICATIONS FOR PROCEDURE:  Candice Bennett is a 65 year old female with  findings of cervical and abdominal adenopathy on imaging studies including  positive PET scan.  She has approximately a 1.5 to 2 cm firm palpable node  in the inferior left posterior neck.  Excisional biopsy has been recommended  and accepted for diagnosis.  The risks of bleeding and infection were  discussed and understood.  She is brought to the operating room for this  procedure.   DESCRIPTION OF PROCEDURE:  The patient was brought to the operating room and  placed in supine position on the operating table and laryngeal mask general  anesthesia was induced.  The left neck was sterilely prepped and draped.  Transverse incision in the skin crease was made and dissection carried down  through the subcu and platysma.  Dissection was carried directly down on the  node which was elevated with a traction suture and it was sharply dissected  along its capsule and excised completely.  It was friable and firm.  It was  sent for frozen section to confirm pathologic material and although a  diagnosis could not be established, it was felt to be abnormal pathologic  tissue with atypia.  Hemostasis was assured.  Subcutaneous and platysma were  closed with interrupted 3-0 Vicryl and skin with running subcuticular  4-0  Monocryl and Steri-Strips.  Sponge, needle and instrument counts were  correct.  Dry sterile dressings were applied.  The patient was then  transferred to the recovery room in good condition.                                               Lorne Skeens. Hoxworth, M.D.    Tory Emerald  D:  02/29/2004  T:  02/29/2004  Job:  [id number]

## 2010-11-07 NOTE — Op Note (Signed)
Candice Bennett, Candice Bennett             ACCOUNT NO.:  [id number]   MEDICAL RECORD NO.:  [id number]          PATIENT TYPE:  AMB   LOCATION:  SDS                          FACILITY:  [facility]   PHYSICIAN:  Ines Bloomer, M.D. DATE OF BIRTH:  02-22-1946   DATE OF PROCEDURE:  04/02/2005  DATE OF DISCHARGE:                                 OPERATIVE REPORT   PREOPERATIVE DIAGNOSIS:  Non-Hodgkin's lymphoma questionable with  mediastinal adenopathy.   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   OPERATION PERFORMED:  Mediastinoscopy.   SURGEON:  Ines Bloomer, M.D.   INDICATIONS FOR PROCEDURE:  This 65 year old patient had a previous  treatment with CHOP therapy for non-Hodgkin's lymphoma and now has some  mediastinal adenopathy.  He was brought here to rule out a recurrence.   DESCRIPTION OF PROCEDURE:  After general anesthesia, the anterior neck was  prepped and draped in the usual sterile manner.  A transverse incision was  made.  This was carried down with electrocautery through subcutaneous  tissue.  The pretracheal fascia was entered and biopsies of the 2R and 4R  nodes were done.  Strap muscles were closed with 2-0 Vicry, subcutaneous  tissue with 3-0 Vicryl and Dermabond for the skin.  The patient was then  transferred to the recovery room in stable condition.           ______________________________  Ines Bloomer, M.D.     DPB/MEDQ  D:  04/02/2005  T:  04/02/2005  Job:  [id number]   cc:   Vicente Serene I. Odogwu, M.D.  Fax: [phone]

## 2010-11-07 NOTE — Assessment & Plan Note (Signed)
[facility]                               PULMONARY OFFICE NOTE   NAME:POOLEFrancee, Setzer                    MRN:          [id number]  DATE:03/29/2006                            DOB:          10/28/1945    Mrs. Sachse is a 65 year old African American female with history of  mediastinal adenopathy with positive mediastinoscopy for sarcoidosis. She  has severe obstructive sleep apnea, followed by Dr. Shelle Iron for same.  She  has had no active respiratory complaints and no chest congestion is noted on  exam.   PHYSICAL EXAMINATION:  VITAL SIGNS:  Temperature 99, blood pressure 110/74,  pulse 83, saturation 99% on room air.  CHEST:  Clear without evidence of wheeze or rhonchi.  CARDIAC:  Regular rate and rhythm without S3.  Normal S1 and S2.  ABDOMEN:  Soft, nontender.  EXTREMITIES:  No edema or clubbing.   CT scan from September 2007 was reviewed and showed no recurrent mediastinal  adenopathy.   IMPRESSION:  Stable sarcoidosis with no evidence of active disease.   PLAN:  Maintain off systemic steroids at this time. Will turn the patient  back to this clinic in followup as needed.       Charlcie Cradle Delford Field, MD, FCCP      PEW/MedQ  DD:  03/29/2006  DT:  03/31/2006  Job #:  [id number]

## 2010-11-10 ENCOUNTER — Other Ambulatory Visit: Payer: Self-pay

## 2010-11-10 MED ORDER — AMLODIPINE BESYLATE 5 MG PO TABS: 5.0000 mg | ORAL_TABLET | Freq: Every day | ORAL | Status: DC

## 2010-11-10 NOTE — Telephone Encounter (Signed)
Rx Refaxed--Not rcv'd by Pharmacy      KP

## 2010-11-18 ENCOUNTER — Ambulatory Visit: Payer: BC Managed Care – PPO | Admitting: Gastroenterology

## 2010-11-18 ENCOUNTER — Encounter: Payer: Self-pay | Admitting: Gastroenterology

## 2010-11-25 ENCOUNTER — Other Ambulatory Visit: Payer: Self-pay | Admitting: Family Medicine

## 2010-11-25 MED ORDER — LABETALOL HCL 100 MG PO TABS: 100.0000 mg | ORAL_TABLET | Freq: Two times a day (BID) | ORAL | Status: DC

## 2010-11-25 NOTE — Telephone Encounter (Signed)
Rx faxed.    KP 

## 2010-12-30 ENCOUNTER — Ambulatory Visit (INDEPENDENT_AMBULATORY_CARE_PROVIDER_SITE_OTHER): Payer: BC Managed Care – PPO | Admitting: Gastroenterology

## 2010-12-30 ENCOUNTER — Encounter: Payer: Self-pay | Admitting: Gastroenterology

## 2010-12-30 VITALS — BP 106/70 | HR 80 | Ht 62.0 in | Wt 204.2 lb

## 2010-12-30 DIAGNOSIS — R159 Full incontinence of feces: Secondary | ICD-10-CM

## 2010-12-30 NOTE — Patient Instructions (Addendum)
We will contact you about scheduling a colonoscopy after we receive the note from Dr Dalene Carrow and speak with Dr Lindell Spar have been given a [employer] handout You may take a Doculax suppository  As needed to help facilitate passing your stools

## 2010-12-30 NOTE — Progress Notes (Signed)
12/30/2010 Candice Bennett [id number] 23-Sep-1945   HISTORY OF PRESENT ILLNESS: Candice Bennett is a 65 year old black female has been followed by Dr. Arlyce Dice for history of adenomatous colon polyps and diverticulosis. She was last seen at the time of her surveillance colonoscopy March 2006. Of note the colonoscopy was normal except for left-sided diverticulosis. 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 Patient tells me that  Candice Bennett wanted her to have a colonoscopy prior to her annual follow up in September 2012. Patient had not made a follow up with [place] but decided to now because of development of fecal seepage. Generally speaking, the patient has not been constipated. She averages a bowel movement every 2-3 days. Lately seeing liquid stool in underwear. She sometimes gets sensation of incomplete evacuation. She denies rectal pain or abdominal pain.   Past Medical History  Diagnosis Date  . Unspecified essential hypertension   . Sleep apnea   . Follicular lymphoma   . OSA (obstructive sleep apnea)   . Sarcoidosis    Past Surgical History  Procedure Date  . Carpal tunnel release   . Tubal ligation   . Lymph node biopsy   . Abdominal hysterectomy 1984    reports that she has never smoked. She has never used smokeless tobacco. She reports that she does not drink alcohol or use illicit drugs. family history includes Diabetes in her brother and Lung cancer in her father. Allergies  Allergen Reactions  . Iohexol       Outpatient Encounter Prescriptions as of 12/30/2010  Medication Sig Dispense Refill  . amLODipine (NORVASC) 5 MG tablet Take 1 tablet (5 mg total) by mouth daily.  90 tablet  1  . labetalol (NORMODYNE) 100 MG tablet Take 1 tablet (100 mg total) by mouth 2 (two) times daily.  180 tablet  0  . DISCONTD: amLODipine (NORVASC) 5 MG tablet TAKE 1 TABLET EVERY DAY  30 tablet  0     REVIEW OF  SYSTEMS  : Reports sleeping problems. All other systems reviewed and negative except where noted in the History of Present Illness.   PHYSICAL EXAM: General: Well developed female in no acute distress Head: Normocephalic and atraumatic Eyes:  sclerae anicteric,conjunctive pink. Ears: Normal auditory acuity Mouth: No deformity or lesions Neck: Supple, no masses.  Lungs: Clear throughout to auscultation Heart: Regular rate and rhythm; no murmurs heard Abdomen: Soft, non distended, nontender. No masses or hepatomegaly noted. Normal Bowel sounds Rectal: Solid stool in vault, brown. Adequate sphincter tone. Musculoskeletal: Symmetrical with no gross deformities  Skin: No lesions on visible extremities Extremities: No edema or deformities noted Neurological: Alert oriented x 4, grossly nonfocal Cervical Nodes:  No significant cervical adenopathy Psychological:  Alert and cooperative. Normal mood and affect  ASSESSMENT AND PLAN;

## 2010-12-31 DIAGNOSIS — R159 Full incontinence of feces: Secondary | ICD-10-CM | POA: Insufficient documentation

## 2010-12-31 NOTE — Assessment & Plan Note (Addendum)
Etiologies include local rectal disease such as hemorrhoids, abnormal sphincter function, and structural abnormalites of the colon. Overflow diarrhea  Plan 1) colonoscopy

## 2011-01-02 ENCOUNTER — Telehealth: Payer: Self-pay | Admitting: *Deleted

## 2011-01-02 NOTE — Telephone Encounter (Signed)
Pt stated she would rather wait and schedule colonoscopy when her recall is due which is 07/2011, explained to her she can call back in Dec 2012 to schedule for Feb. Told her to contact the office if she has any problems or questions   Candice Bennett

## 2011-01-05 NOTE — Telephone Encounter (Signed)
Paula aware.

## 2011-02-12 ENCOUNTER — Telehealth: Payer: Self-pay | Admitting: *Deleted

## 2011-02-12 NOTE — Telephone Encounter (Signed)
Patient aware letter ready for pickup--said she would swing by and pick it up     KP

## 2011-02-12 NOTE — Telephone Encounter (Signed)
Pt states that she is currently taking a swim class and they require a note stating that it is ok to participate since she has hypertension. Please advise

## 2011-02-12 NOTE — Telephone Encounter (Signed)
Ok to give note that its ok to swim

## 2011-02-18 ENCOUNTER — Other Ambulatory Visit: Payer: Self-pay | Admitting: Family Medicine

## 2011-02-18 MED ORDER — AMLODIPINE BESYLATE 5 MG PO TABS: 5.0000 mg | ORAL_TABLET | Freq: Every day | ORAL | Status: DC

## 2011-02-18 NOTE — Telephone Encounter (Signed)
appt scheduled    KP

## 2011-03-10 ENCOUNTER — Encounter (HOSPITAL_BASED_OUTPATIENT_CLINIC_OR_DEPARTMENT_OTHER): Payer: BC Managed Care – PPO | Admitting: Hematology and Oncology

## 2011-03-10 ENCOUNTER — Other Ambulatory Visit: Payer: Self-pay | Admitting: Hematology and Oncology

## 2011-03-10 ENCOUNTER — Ambulatory Visit (HOSPITAL_COMMUNITY)
Admission: RE | Admit: 2011-03-10 | Discharge: 2011-03-10 | Disposition: A | Discharge status: Home / Self Care | Payer: BC Managed Care – PPO | Source: Ambulatory Visit | Attending: Hematology and Oncology | Admitting: Hematology and Oncology

## 2011-03-10 ENCOUNTER — Other Ambulatory Visit (HOSPITAL_COMMUNITY): Payer: Self-pay

## 2011-03-10 DIAGNOSIS — C8589 Other specified types of non-Hodgkin lymphoma, extranodal and solid organ sites: Secondary | ICD-10-CM | POA: Insufficient documentation

## 2011-03-10 DIAGNOSIS — C8299 Follicular lymphoma, unspecified, extranodal and solid organ sites: Secondary | ICD-10-CM

## 2011-03-10 DIAGNOSIS — C8590 Non-Hodgkin lymphoma, unspecified, unspecified site: Secondary | ICD-10-CM

## 2011-03-10 DIAGNOSIS — K7689 Other specified diseases of liver: Secondary | ICD-10-CM | POA: Insufficient documentation

## 2011-03-10 LAB — CMP (CANCER CENTER ONLY)
Albumin: 4 g/dL (ref 3.3–5.5)
BUN, Bld: 12 mg/dL (ref 7–22)
CO2: 27 mEq/L (ref 18–33)
Calcium: 9.5 mg/dL (ref 8.0–10.3)
Chloride: 103 mEq/L (ref 98–108)
Glucose, Bld: 128 mg/dL — ABNORMAL HIGH (ref 73–118)
Potassium: 4.3 mEq/L (ref 3.3–4.7)

## 2011-03-10 LAB — CBC WITH DIFFERENTIAL/PLATELET
Basophils Absolute: 0 10*3/uL (ref 0.0–0.1)
Eosinophils Absolute: 0 10*3/uL (ref 0.0–0.5)
HGB: 12.7 g/dL (ref 11.6–15.9)
NEUT#: 5.6 10*3/uL (ref 1.5–6.5)
RDW: 14.6 % — ABNORMAL HIGH (ref 11.2–14.5)
lymph#: 1.9 10*3/uL (ref 0.9–3.3)

## 2011-03-10 MED ORDER — IOHEXOL 300 MG/ML  SOLN
100.0000 mL | Freq: Once | INTRAMUSCULAR | Status: AC | PRN
  Administered 2011-03-10: 100 mL via INTRAVENOUS

## 2011-03-13 ENCOUNTER — Encounter (HOSPITAL_BASED_OUTPATIENT_CLINIC_OR_DEPARTMENT_OTHER): Payer: BC Managed Care – PPO | Admitting: Hematology and Oncology

## 2011-03-13 DIAGNOSIS — C8299 Follicular lymphoma, unspecified, extranodal and solid organ sites: Secondary | ICD-10-CM

## 2011-03-17 LAB — PROTEIN ELECTROPHORESIS, SERUM, WITH REFLEX
Beta 2: 6.2 % (ref 3.2–6.5)
Gamma Globulin: 17.4 % (ref 11.1–18.8)

## 2011-04-02 ENCOUNTER — Other Ambulatory Visit: Payer: Self-pay | Admitting: Family Medicine

## 2011-04-07 ENCOUNTER — Other Ambulatory Visit (HOSPITAL_COMMUNITY)
Admission: RE | Admit: 2011-04-07 | Discharge: 2011-04-07 | Disposition: A | Discharge status: Home / Self Care | Payer: BC Managed Care – PPO | Source: Ambulatory Visit | Attending: Family Medicine | Admitting: Family Medicine

## 2011-04-07 ENCOUNTER — Ambulatory Visit (INDEPENDENT_AMBULATORY_CARE_PROVIDER_SITE_OTHER): Payer: BC Managed Care – PPO | Admitting: Family Medicine

## 2011-04-07 ENCOUNTER — Encounter: Payer: Self-pay | Admitting: Family Medicine

## 2011-04-07 VITALS — BP 116/81 | HR 68 | Temp 97.9°F | Ht 63.0 in | Wt 200.4 lb

## 2011-04-07 DIAGNOSIS — Z01419 Encounter for gynecological examination (general) (routine) without abnormal findings: Secondary | ICD-10-CM | POA: Insufficient documentation

## 2011-04-07 DIAGNOSIS — G4730 Sleep apnea, unspecified: Secondary | ICD-10-CM

## 2011-04-07 DIAGNOSIS — I10 Essential (primary) hypertension: Secondary | ICD-10-CM

## 2011-04-07 DIAGNOSIS — R319 Hematuria, unspecified: Secondary | ICD-10-CM

## 2011-04-07 DIAGNOSIS — M810 Age-related osteoporosis without current pathological fracture: Secondary | ICD-10-CM

## 2011-04-07 DIAGNOSIS — Z23 Encounter for immunization: Secondary | ICD-10-CM

## 2011-04-07 DIAGNOSIS — Z0000 Encounter for general adult medical examination without abnormal findings: Secondary | ICD-10-CM

## 2011-04-07 LAB — POCT URINALYSIS DIPSTICK
Bilirubin, UA: NEGATIVE
Leukocytes, UA: NEGATIVE
Nitrite, UA: NEGATIVE
Protein, UA: NEGATIVE
Urobilinogen, UA: 0.2
pH, UA: 6.5

## 2011-04-07 LAB — CBC WITH DIFFERENTIAL/PLATELET
Basophils Relative: 2 % (ref 0.0–3.0)
Eosinophils Absolute: 0.1 10*3/uL (ref 0.0–0.7)
Eosinophils Relative: 2.6 % (ref 0.0–5.0)
Lymphocytes Relative: 48.4 % — ABNORMAL HIGH (ref 12.0–46.0)
Neutrophils Relative %: 40.5 % — ABNORMAL LOW (ref 43.0–77.0)
Platelets: 328 10*3/uL (ref 150.0–400.0)
RBC: 4.55 Mil/uL (ref 3.87–5.11)
WBC: 5.6 10*3/uL (ref 4.5–10.5)

## 2011-04-07 LAB — HEPATIC FUNCTION PANEL
Alkaline Phosphatase: 67 U/L (ref 39–117)
Bilirubin, Direct: 0 mg/dL (ref 0.0–0.3)
Total Bilirubin: 0.8 mg/dL (ref 0.3–1.2)
Total Protein: 7.8 g/dL (ref 6.0–8.3)

## 2011-04-07 LAB — LIPID PANEL
HDL: 64.1 mg/dL (ref >39.00)
LDL Cholesterol: 109 mg/dL — ABNORMAL HIGH (ref 0–99)
Total CHOL/HDL Ratio: 3

## 2011-04-07 LAB — BASIC METABOLIC PANEL
Calcium: 9.1 mg/dL (ref 8.4–10.5)
Creatinine, Ser: 0.7 mg/dL (ref 0.4–1.2)

## 2011-04-07 MED ORDER — AMLODIPINE BESYLATE 5 MG PO TABS: 5.0000 mg | ORAL_TABLET | Freq: Every day | ORAL | Status: DC

## 2011-04-07 MED ORDER — LABETALOL HCL 100 MG PO TABS: ORAL_TABLET | ORAL | Status: DC

## 2011-04-07 NOTE — Patient Instructions (Signed)

## 2011-04-07 NOTE — Progress Notes (Signed)
Subjective:     Candice Bennett is a 65 y.o. female and is here for a comprehensive physical exam. The patient reports no problems.  History   Social History  . Marital Status: Divorced    Spouse Name: N/A    Number of Children: 2  . Years of Education: N/A   Occupational History  . [employer]     Retired  .  [employer]   Social History Main Topics  . Smoking status: Never Smoker   . Smokeless tobacco: Never Used  . Alcohol Use: No  . Drug Use: No  . Sexually Active: Not on file   Other Topics Concern  . Not on file   Social History Narrative   No regularly exercise   Health Maintenance  Topic Date Due  . Pap Smear  08/15/1965  . Tetanus/tdap  08/10/2010  . Influenza Vaccine  03/22/2012  . Mammogram  08/14/2012  . Colonoscopy  09/03/2014  . Zostavax  Completed    The following portions of the patient's history were reviewed and updated as appropriate: allergies, current medications, past family history, past medical history, past social history, past surgical history and problem list.  Review of Systems Review of Systems  Constitutional: Negative for activity change, appetite change and fatigue.  HENT: Negative for hearing loss, congestion, tinnitus and ear discharge.  dentist q56m Eyes: Negative for visual disturbance (see optho q3y -- vision corrected to 20/20 with glasses).  Respiratory: Negative for cough, chest tightness and shortness of breath.   Cardiovascular: Negative for chest pain, palpitations and leg swelling.  Gastrointestinal: Negative for abdominal pain, diarrhea, constipation and abdominal distention.  Genitourinary: Negative for urgency, frequency, decreased urine volume and difficulty urinating.  Musculoskeletal: Negative for back pain, arthralgias and gait problem.  Skin: Negative for color change, pallor and rash.  Neurological: Negative for dizziness, light-headedness, numbness and headaches.  Hematological: Negative for adenopathy. Does  not bruise/bleed easily.  Psychiatric/Behavioral: Negative for suicidal ideas, confusion, sleep disturbance, self-injury, dysphoric mood, decreased concentration and agitation.       Objective:    BP 116/81  Pulse 68  Temp(Src) 97.9 F (36.6 C) (Oral)  Ht 5\' 3"  (1.6 m)  Wt 200 lb 6.4 oz (90.901 kg)  BMI 35.50 kg/m2  SpO2 97%  General Appearance:    Alert, cooperative, no distress, appears stated age  Head:    Normocephalic, without obvious abnormality, atraumatic  Eyes:    PERRL, conjunctiva/corneas clear, EOM's intact, fundi    benign, both eyes  Ears:    Normal TM's and external ear canals, both ears  Nose:   Nares normal, septum midline, mucosa normal, no drainage    or sinus tenderness  Throat:   Lips, mucosa, and tongue normal; teeth and gums normal  Neck:   Supple, symmetrical, trachea midline, no adenopathy;    thyroid:  no enlargement/tenderness/nodules; no carotid   bruit or JVD  Back:     Symmetric, no curvature, ROM normal, no CVA tenderness  Lungs:     Clear to auscultation bilaterally, respirations unlabored  Chest Wall:    No tenderness or deformity   Heart:    Regular rate and rhythm, S1 and S2 normal, no murmur, rub   or gallop  Breast Exam:    No tenderness, masses, or nipple abnormality  Abdomen:     Soft, non-tender, bowel sounds active all four quadrants,    no masses, no organomegaly  Genitalia:    Normal female without lesion, discharge  or tenderness,  Vaginal smear done,   S/p TAH  Rectal:    Normal tone,, no masses or tenderness;   guaiac negative stool  Extremities:   Extremities normal, atraumatic, no cyanosis or edema  Pulses:   2+ and symmetric all extremities  Skin:   Skin color, texture, turgor normal, no rashes or lesions  Lymph nodes:   Cervical, supraclavicular, and axillary nodes normal  Neurologic:   CNII-XII intact, normal strength, sensation and reflexes    throughout      Assessment:    Healthy female exam.     non hodgkins  lymphoma--per oncology htn--stable con't meds Plan:    ghm utd Check fasting labs See After Visit Summary for Counseling Recommendations

## 2011-04-09 LAB — URINE CULTURE: Organism ID, Bacteria: NO GROWTH

## 2011-04-21 ENCOUNTER — Ambulatory Visit
Admission: RE | Admit: 2011-04-21 | Discharge: 2011-04-21 | Disposition: A | Discharge status: Home / Self Care | Payer: BC Managed Care – PPO | Source: Ambulatory Visit | Attending: Family Medicine | Admitting: Family Medicine

## 2011-04-21 DIAGNOSIS — M8580 Other specified disorders of bone density and structure, unspecified site: Secondary | ICD-10-CM

## 2011-05-04 ENCOUNTER — Institutional Professional Consult (permissible substitution): Payer: BC Managed Care – PPO | Admitting: Pulmonary Disease

## 2011-05-22 ENCOUNTER — Institutional Professional Consult (permissible substitution): Payer: BC Managed Care – PPO | Admitting: Pulmonary Disease

## 2011-05-22 ENCOUNTER — Other Ambulatory Visit: Payer: Self-pay | Admitting: Family Medicine

## 2011-05-28 ENCOUNTER — Institutional Professional Consult (permissible substitution): Payer: BC Managed Care – PPO | Admitting: Pulmonary Disease

## 2011-06-15 ENCOUNTER — Encounter: Payer: Self-pay | Admitting: Family Medicine

## 2011-06-26 ENCOUNTER — Encounter: Payer: Self-pay | Admitting: Pulmonary Disease

## 2011-06-26 ENCOUNTER — Ambulatory Visit (INDEPENDENT_AMBULATORY_CARE_PROVIDER_SITE_OTHER): Payer: BC Managed Care – PPO | Admitting: Pulmonary Disease

## 2011-06-26 VITALS — BP 128/82 | HR 66 | Temp 98.2°F | Ht 63.0 in | Wt 200.8 lb

## 2011-06-26 DIAGNOSIS — G4733 Obstructive sleep apnea (adult) (pediatric): Secondary | ICD-10-CM

## 2011-06-26 NOTE — Assessment & Plan Note (Signed)
The patient has a history of moderate obstructive sleep apnea, and was unable to tolerate CPAP on a consistent basis.  She now comes in today with worsening symptoms, and wished to review her different treatment options at this time.  She really does not have enough anatomical abnormalities to justify upper airway surgery.  That would leave trying CPAP again, or possibly a dental appliance.  She has removable bridge work, and is unclear whether dental appliance as an option for her.  I have given her patient education handouts on a dental appliance, and also the websites for the different CPAP companies for her to look at some of the newer mask types.  She is to think about her options over the weekend, and let me know next week which direction she would like to get it.  I have stressed to her the importance of weight loss as well.

## 2011-06-26 NOTE — Progress Notes (Signed)
  Subjective:    Patient ID: Candice Bennett, female    DOB: 1946-02-03, 66 y.o.   MRN: [id number]  HPI The patient is a 66 year old female who comes in today for management of the chart of sleep apnea.  She was diagnosed in 2004 with moderate sleep apnea, with an AHI of 19 events per hour.  She was started on CPAP, but had great difficulty with tolerance.  She had issues with having anything on her face while trying to sleep.  She quit using the CPAP after one year, and I have not seen her back since.  Since that time, the patient has continued to have some degree of inappropriate daytime sleepiness, but recently has been getting worse.  She will easily fall asleep in a movie theater, or at plays.  She has also had recently issues with sleepiness while driving.  She does not feel rested in the mornings upon arising, but admits that her total sleep time often is not adequate.  She is unsure if she continues to snore or have an abnormal breathing pattern during sleep.  The patient's weight is up 6 pounds since her sleep study in 2004, and her Epworth score today is 13.  Sleep Questionnaire: What time do you typically go to bed?( Between what hours) 12:00 - 2:00 How long does it take you to fall asleep? usually a few mins How many times during the night do you wake up? 2 What time do you get out of bed to start your day? 0730 Do you drive or operate heavy machinery in your occupation? No How much has your weight changed (up or down) over the past two years? (In pounds) 0 oz (0 kg) Have you ever had a sleep study before? Yes If yes, location of study? [facility] If yes, date of study? 2004 Do you currently use CPAP? No Do you wear oxygen at any time? No    Review of Systems  Constitutional: Negative for fever and unexpected weight change.  HENT: Positive for dental problem. Negative for ear pain, nosebleeds, congestion, sore throat, rhinorrhea, sneezing, trouble swallowing, postnasal drip and sinus pressure.     Eyes: Negative for redness and itching.  Respiratory: Negative for cough, chest tightness, shortness of breath and wheezing.   Cardiovascular: Negative for palpitations and leg swelling.  Gastrointestinal: Negative for nausea and vomiting.  Genitourinary: Negative for dysuria.  Musculoskeletal: Positive for joint swelling.  Skin: Negative for rash.  Neurological: Negative for headaches.  Hematological: Does not bruise/bleed easily.  Psychiatric/Behavioral: Negative for dysphoric mood. The patient is not nervous/anxious.        Objective:   Physical Exam Constitutional:  Well developed, no acute distress  HENT:  Nares patent without discharge  Oropharynx without exudate, palate ok but uvula elongated.  Eyes:  Perrla, eomi, no scleral icterus  Neck:  No JVD, no TMG  Cardiovascular:  Normal rate, regular rhythm, no rubs or gallops.  No murmurs        Intact distal pulses  Pulmonary :  Normal breath sounds, no stridor or respiratory distress   No rales, rhonchi, or wheezing  Abdominal:  Soft, nondistended, bowel sounds present.  No tenderness noted.   Musculoskeletal:  No lower extremity edema noted.  Lymph Nodes:  No cervical lymphadenopathy noted  Skin:  No cyanosis noted  Neurologic: appears sleepy, appropriate, moves all 4 extremities without obvious deficit.         Assessment & Plan:

## 2011-06-26 NOTE — Patient Instructions (Signed)
Work on [employer] reduction Consider dental appliance, review handouts, and get back with me next week about cpap trial vs dental appliance.

## 2011-07-05 ENCOUNTER — Other Ambulatory Visit: Payer: Self-pay | Admitting: Family Medicine

## 2011-07-15 ENCOUNTER — Telehealth: Payer: Self-pay | Admitting: Pulmonary Disease

## 2011-07-15 NOTE — Telephone Encounter (Signed)
[id number].Candice Bennett, CMA  

## 2011-07-15 NOTE — Telephone Encounter (Signed)
Noted.  I will await her call. 

## 2011-07-15 NOTE — Telephone Encounter (Signed)
lmomtcb x 2  

## 2011-07-15 NOTE — Telephone Encounter (Signed)
Pt returning call Candice Bennett  °

## 2011-07-15 NOTE — Telephone Encounter (Signed)
Spoke with pt. She states calling to let [facility] know that she has had to put her sleep issues on hold for a while b/c she is having a lot of issues with her knee and is having to get "shots" in her knee and is in a lot of pain. She states that once all the knee problems get better she will focus on OSA and what sort of tx she wants to go with. She states did not want [facility] to think that she had forgotten about this. I thanked her for the call to inform us of this and advised will let [facility] know. Just an FYI.

## 2011-07-20 ENCOUNTER — Other Ambulatory Visit: Payer: Self-pay | Admitting: Family Medicine

## 2011-07-20 DIAGNOSIS — I10 Essential (primary) hypertension: Secondary | ICD-10-CM

## 2011-07-20 MED ORDER — AMLODIPINE BESYLATE 5 MG PO TABS: 5.0000 mg | ORAL_TABLET | Freq: Every day | ORAL | Status: DC

## 2011-07-20 NOTE — Telephone Encounter (Signed)
Faxed.   KP 

## 2011-08-03 ENCOUNTER — Encounter: Payer: Self-pay | Admitting: Gastroenterology

## 2011-08-03 ENCOUNTER — Other Ambulatory Visit: Payer: Self-pay | Admitting: Family Medicine

## 2011-08-03 DIAGNOSIS — Z1231 Encounter for screening mammogram for malignant neoplasm of breast: Secondary | ICD-10-CM

## 2011-08-05 ENCOUNTER — Encounter: Payer: Self-pay | Admitting: Gastroenterology

## 2011-08-07 ENCOUNTER — Encounter: Payer: Self-pay | Admitting: Gastroenterology

## 2011-08-07 ENCOUNTER — Ambulatory Visit (AMBULATORY_SURGERY_CENTER): Payer: BC Managed Care – PPO | Admitting: *Deleted

## 2011-08-07 VITALS — Ht 63.0 in | Wt 202.4 lb

## 2011-08-07 DIAGNOSIS — Z1211 Encounter for screening for malignant neoplasm of colon: Secondary | ICD-10-CM

## 2011-08-07 MED ORDER — PEG-KCL-NACL-NASULF-NA ASC-C 100 G PO SOLR: ORAL | Status: DC

## 2011-08-18 ENCOUNTER — Ambulatory Visit
Admission: RE | Admit: 2011-08-18 | Discharge: 2011-08-18 | Disposition: A | Discharge status: Home / Self Care | Payer: BC Managed Care – PPO | Source: Ambulatory Visit | Attending: Family Medicine | Admitting: Family Medicine

## 2011-08-18 DIAGNOSIS — Z1231 Encounter for screening mammogram for malignant neoplasm of breast: Secondary | ICD-10-CM

## 2011-08-20 ENCOUNTER — Ambulatory Visit (AMBULATORY_SURGERY_CENTER): Payer: BC Managed Care – PPO | Admitting: Gastroenterology

## 2011-08-20 ENCOUNTER — Encounter: Payer: Self-pay | Admitting: Gastroenterology

## 2011-08-20 VITALS — BP 134/89 | HR 84 | Temp 98.3°F | Resp 20 | Ht 63.0 in | Wt 202.0 lb

## 2011-08-20 DIAGNOSIS — Z1211 Encounter for screening for malignant neoplasm of colon: Secondary | ICD-10-CM

## 2011-08-20 DIAGNOSIS — K5730 Diverticulosis of large intestine without perforation or abscess without bleeding: Secondary | ICD-10-CM

## 2011-08-20 MED ORDER — SODIUM CHLORIDE 0.9 % IV SOLN: 500.0000 mL | INTRAVENOUS | Status: DC

## 2011-08-20 NOTE — Patient Instructions (Signed)
Discharge instructions given with verbal understanding. Handouts on diverticulosis and a high fiber diet given. Resume previous medications.YOU HAD AN ENDOSCOPIC PROCEDURE TODAY AT THE [facility]: Refer to the procedure report that was given to you for any specific questions about what was found during the examination.  If the procedure report does not answer your questions, please call your gastroenterologist to clarify.  If you requested that your care partner not be given the details of your procedure findings, then the procedure report has been included in a sealed envelope for you to review at your convenience later.  YOU SHOULD EXPECT: Some feelings of bloating in the abdomen. Passage of more gas than usual.  Walking can help get rid of the air that was put into your GI tract during the procedure and reduce the bloating. If you had a lower endoscopy (such as a colonoscopy or flexible sigmoidoscopy) you may notice spotting of blood in your stool or on the toilet paper. If you underwent a bowel prep for your procedure, then you may not have a normal bowel movement for a few days.  DIET: Your first meal following the procedure should be a light meal and then it is ok to progress to your normal diet.  A half-sandwich or bowl of soup is an example of a good first meal.  Heavy or fried foods are harder to digest and may make you feel nauseous or bloated.  Likewise meals heavy in dairy and vegetables can cause extra gas to form and this can also increase the bloating.  Drink plenty of fluids but you should avoid alcoholic beverages for 24 hours.  ACTIVITY: Your care partner should take you home directly after the procedure.  You should plan to take it easy, moving slowly for the rest of the day.  You can resume normal activity the day after the procedure however you should NOT DRIVE or use heavy machinery for 24 hours (because of the sedation medicines used during the test).    SYMPTOMS TO  REPORT IMMEDIATELY: A gastroenterologist can be reached at any hour.  During normal business hours, 8:30 AM to 5:00 PM Monday through Friday, call [phone].  After hours and on weekends, please call the GI answering service at [phone] who will take a message and have the physician on call contact you.   Following lower endoscopy (colonoscopy or flexible sigmoidoscopy):  Excessive amounts of blood in the stool  Significant tenderness or worsening of abdominal pains  Swelling of the abdomen that is new, acute  Fever of 100F or higher  FOLLOW UP: If any biopsies were taken you will be contacted by phone or by letter within the next 1-3 weeks.  Call your gastroenterologist if you have not heard about the biopsies in 3 weeks.  Our staff will call the home number listed on your records the next business day following your procedure to check on you and address any questions or concerns that you may have at that time regarding the information given to you following your procedure. This is a courtesy call and so if there is no answer at the home number and we have not heard from you through the emergency physician on call, we will assume that you have returned to your regular daily activities without incident.  SIGNATURES/CONFIDENTIALITY: You and/or your care partner have signed paperwork which will be entered into your electronic medical record.  These signatures attest to the fact that that the information above on your   After Visit Summary has been reviewed and is understood.  Full responsibility of the confidentiality of this discharge information lies with you and/or your care-partner.   

## 2011-08-20 NOTE — Progress Notes (Signed)
Patient did not experience any of the following events: a burn prior to discharge; a fall within the facility; wrong site/side/patient/procedure/implant event; or a hospital transfer or hospital admission upon discharge from the facility. (G8907) Patient did not have preoperative order for IV antibiotic SSI prophylaxis. (G8918)  

## 2011-08-20 NOTE — Op Note (Signed)
[facility] [address] [place], [place]  [address]  COLONOSCOPY PROCEDURE REPORT  PATIENT:  Candice Bennett, Candice Bennett  MR#:  [id number] BIRTHDATE:  02-03-1946, 66 yrs. old  GENDER:  female ENDOSCOPIST:  Barbette Hair. Arlyce Dice, MD REF. BY: PROCEDURE DATE:  08/20/2011 PROCEDURE:  Diagnostic Colonoscopy ASA CLASS:  Class II INDICATIONS:  fecal incontinence h/o lymphoma MEDICATIONS:   MAC sedation, administered by CRNA propofol 150mg IV  DESCRIPTION OF PROCEDURE:   After the risks benefits and alternatives of the procedure were thoroughly explained, informed consent was obtained.  Digital rectal exam was performed and revealed no abnormalities.   The LB 180AL [id number] endoscope was introduced through the anus and advanced to the cecum, which was identified by both the appendix and ileocecal valve, without limitations.  The quality of the prep was excellent, using MoviPrep.  The instrument was then slowly withdrawn as the colon was fully examined. <<PROCEDUREIMAGES>>  FINDINGS:  Scattered diverticula were found in the sigmoid colon. This was otherwise a normal examination of the colon (see image1, image2, and image3).   Retroflexed views in the rectum revealed no abnormalities.    The time to cecum =  1) 3.25  minutes. The scope was then withdrawn in  1) 7.0  minutes from the cecum and the procedure completed. COMPLICATIONS:  None ENDOSCOPIC IMPRESSION: 1) Diverticula, scattered in the sigmoid colon 2) Otherwise normal examination RECOMMENDATIONS: 1) High fiber diet. 2) Call office for follow-up appointment in 4 weeks REPEAT EXAM:  In 10 year(s) for colonoscopy  ______________________________ Barbette Hair. Arlyce Dice, MD  CC:  Arlan Organ, MD  n. Rosalie DoctorBarbette Hair. Ilan Kahrs at 08/20/2011 02:14 PM  Janalee Dane, [phone]

## 2011-08-21 ENCOUNTER — Telehealth: Payer: Self-pay

## 2011-08-21 NOTE — Telephone Encounter (Signed)
  Follow up Call-  Call back number 08/20/2011  Post procedure Call Back phone  # [phone]  Permission to leave phone message Yes     Patient questions:  Do you have a fever, pain , or abdominal swelling? no Pain Score  0 *  Have you tolerated food without any problems? yes  Have you been able to return to your normal activities? yes  Do you have any questions about your discharge instructions: Diet   no Medications  no Follow up visit  no  Do you have questions or concerns about your Care? no  Actions: * If pain score is 4 or above: No action needed, pain <4.

## 2011-09-16 ENCOUNTER — Ambulatory Visit: Payer: BC Managed Care – PPO | Admitting: Gastroenterology

## 2011-09-21 ENCOUNTER — Ambulatory Visit (INDEPENDENT_AMBULATORY_CARE_PROVIDER_SITE_OTHER): Payer: Self-pay | Admitting: Gastroenterology

## 2011-09-21 ENCOUNTER — Encounter: Payer: Self-pay | Admitting: Gastroenterology

## 2011-09-21 VITALS — BP 106/78 | HR 60 | Ht 63.0 in | Wt 201.6 lb

## 2011-09-21 DIAGNOSIS — R159 Full incontinence of feces: Secondary | ICD-10-CM

## 2011-09-21 NOTE — Progress Notes (Signed)
History of Present Illness:  Candice Bennett has returned for followup of stool incontinence. Colonoscopy in February, 2013 demonstrated diverticulosis only. With fiber supplementation stool incontinence is significantly improved. She occasionally has some soilage of her underclothes. It is definitely improved.    Review of Systems: Pertinent positive and negative review of systems were noted in the above HPI section. All other review of systems were otherwise negative.    Current Medications, Allergies, Past Medical History, Past Surgical History, Family History and Social History were reviewed in [employer] electronic medical record  Vital signs were reviewed in today's medical record. Physical Exam: General: Well developed , well nourished, no acute distress

## 2011-09-21 NOTE — Assessment & Plan Note (Signed)
Improved with fiber supplementation. She still has occasional episodes of slight incontinence following a bowel movement. I explained to her that injection therapy with Solendra for fecal incontinence will soon be available.

## 2011-09-21 NOTE — Patient Instructions (Signed)
Follow up as needed

## 2011-11-04 ENCOUNTER — Other Ambulatory Visit: Payer: Self-pay | Admitting: Family Medicine

## 2011-11-20 ENCOUNTER — Telehealth: Payer: Self-pay | Admitting: *Deleted

## 2011-11-20 MED ORDER — MOMETASONE FUROATE 0.1 % EX CREA: TOPICAL_CREAM | CUTANEOUS | Status: DC

## 2011-11-20 NOTE — Telephone Encounter (Signed)
Ok for mometasone (elocon) 0.1% cream to be used sparingly once daily, 15g

## 2011-11-20 NOTE — Telephone Encounter (Signed)
Pt called per noted a rash that she has had in the past that usually flares up once a year and wanted to see if MD Lowne could prescribe the same lotion/cream/ointment she did in the past, pt notes she uses [facility] on [address], advised MD Laury Axon is out of the office til Monday, pt understood, noted pt was given El con Cream 1% (Metacomon) a year ago for rash by MD Laury Axon, noted in centricity, please advise

## 2011-11-20 NOTE — Telephone Encounter (Signed)
rx sent to pharmacy by e-script Pt aware 

## 2012-01-02 ENCOUNTER — Other Ambulatory Visit: Payer: Self-pay | Admitting: Family Medicine

## 2012-03-07 ENCOUNTER — Other Ambulatory Visit: Payer: Self-pay | Admitting: Hematology and Oncology

## 2012-03-07 ENCOUNTER — Telehealth: Payer: Self-pay | Admitting: Hematology and Oncology

## 2012-03-07 DIAGNOSIS — C8590 Non-Hodgkin lymphoma, unspecified, unspecified site: Secondary | ICD-10-CM

## 2012-03-07 NOTE — Telephone Encounter (Signed)
S/w pt today re appts for 9/18 and 9/20 [facility]). Per pt at last discussion she and LO talked about playing next annual f/u by ear. Per pt she does not feel she needs any future lb/fu/ct scans. Message sent to LO - pt aware.

## 2012-03-08 ENCOUNTER — Encounter: Payer: Self-pay | Admitting: Hematology and Oncology

## 2012-03-08 NOTE — Progress Notes (Signed)
03/08/2012 I spoke with Candice Bennett RE: chest, abdomen and pelvis, neck ct scans scheduled for Wednesday, 03/09/2012.  The patient has no insurance, she said she had spoken to someone much earlier canceling any appointments associated with these scans.  The patient is currently uninsured and will obtain medicare at the age of 31.  She further stated that she was a year out from her last exams and felt fine.  She will call if anything changes with her physical condition.  I notified the Melissa, Lead Scheduler via phone and central scheduling @ [facility] to cancel these appointments.

## 2012-03-09 ENCOUNTER — Other Ambulatory Visit: Payer: Self-pay | Admitting: Lab

## 2012-03-09 ENCOUNTER — Other Ambulatory Visit (HOSPITAL_COMMUNITY): Payer: Self-pay

## 2012-03-11 ENCOUNTER — Ambulatory Visit: Payer: BC Managed Care – PPO | Admitting: Hematology and Oncology

## 2012-05-04 ENCOUNTER — Other Ambulatory Visit: Payer: Self-pay | Admitting: Family Medicine

## 2012-05-04 NOTE — Telephone Encounter (Signed)
Rx sent.    MW 

## 2012-08-09 ENCOUNTER — Other Ambulatory Visit: Payer: Self-pay | Admitting: Family Medicine

## 2012-10-03 ENCOUNTER — Other Ambulatory Visit: Payer: Self-pay | Admitting: Family Medicine

## 2012-11-01 ENCOUNTER — Other Ambulatory Visit: Payer: Self-pay | Admitting: Family Medicine

## 2012-11-01 NOTE — Telephone Encounter (Signed)
Rx sent to the pharmacy by e-script.//AB/CMA 

## 2012-11-03 ENCOUNTER — Telehealth: Payer: Self-pay | Admitting: General Practice

## 2012-11-03 NOTE — Telephone Encounter (Signed)
Pt called wanting to know if we had any discount cards for Labetalol. Pt was calle dback and advised we do not have any.

## 2012-11-16 ENCOUNTER — Other Ambulatory Visit: Payer: Self-pay

## 2012-11-16 DIAGNOSIS — Z1231 Encounter for screening mammogram for malignant neoplasm of breast: Secondary | ICD-10-CM

## 2012-12-07 ENCOUNTER — Other Ambulatory Visit: Payer: Self-pay | Admitting: Family Medicine

## 2012-12-09 ENCOUNTER — Ambulatory Visit (INDEPENDENT_AMBULATORY_CARE_PROVIDER_SITE_OTHER): Payer: Medicare Other | Admitting: Family Medicine

## 2012-12-09 ENCOUNTER — Other Ambulatory Visit (HOSPITAL_COMMUNITY)
Admission: RE | Admit: 2012-12-09 | Discharge: 2012-12-09 | Disposition: A | Discharge status: Home / Self Care | Payer: Medicare Other | Source: Ambulatory Visit | Attending: Family Medicine | Admitting: Family Medicine

## 2012-12-09 ENCOUNTER — Encounter: Payer: Self-pay | Admitting: Family Medicine

## 2012-12-09 VITALS — BP 105/68 | HR 81 | Temp 98.3°F | Ht 62.5 in | Wt 199.2 lb

## 2012-12-09 DIAGNOSIS — Z23 Encounter for immunization: Secondary | ICD-10-CM | POA: Diagnosis not present

## 2012-12-09 DIAGNOSIS — Z0000 Encounter for general adult medical examination without abnormal findings: Secondary | ICD-10-CM | POA: Diagnosis not present

## 2012-12-09 DIAGNOSIS — I10 Essential (primary) hypertension: Secondary | ICD-10-CM | POA: Diagnosis not present

## 2012-12-09 DIAGNOSIS — Z87898 Personal history of other specified conditions: Secondary | ICD-10-CM

## 2012-12-09 DIAGNOSIS — Z0100 Encounter for examination of eyes and vision without abnormal findings: Secondary | ICD-10-CM | POA: Diagnosis not present

## 2012-12-09 DIAGNOSIS — Z124 Encounter for screening for malignant neoplasm of cervix: Secondary | ICD-10-CM | POA: Diagnosis not present

## 2012-12-09 LAB — POCT URINALYSIS DIPSTICK
Ketones, UA: NEGATIVE
Protein, UA: NEGATIVE
Spec Grav, UA: 1.02
Urobilinogen, UA: 0.2
pH, UA: 6

## 2012-12-09 LAB — LIPID PANEL
Cholesterol: 191 mg/dL (ref 0–200)
HDL: 65 mg/dL (ref >39)
LDL Cholesterol: 108 mg/dL — ABNORMAL HIGH (ref 0–99)
Triglycerides: 90 mg/dL (ref <150)
VLDL: 18 mg/dL (ref 0–40)

## 2012-12-09 LAB — CBC WITH DIFFERENTIAL/PLATELET
Basophils Absolute: 0.1 10*3/uL (ref 0.0–0.1)
Basophils Relative: 1 % (ref 0–1)
Lymphocytes Relative: 54 % — ABNORMAL HIGH (ref 12–46)
MCHC: 31.6 g/dL (ref 30.0–36.0)
Neutro Abs: 2.4 10*3/uL (ref 1.7–7.7)
Neutrophils Relative %: 38 % — ABNORMAL LOW (ref 43–77)
Platelets: 327 10*3/uL (ref 150–400)
RDW: 15.2 % (ref 11.5–15.5)
WBC: 6.4 10*3/uL (ref 4.0–10.5)

## 2012-12-09 LAB — HEPATIC FUNCTION PANEL
ALT: 13 U/L (ref 0–35)
Bilirubin, Direct: 0.1 mg/dL (ref 0.0–0.3)
Indirect Bilirubin: 0.4 mg/dL (ref 0.0–0.9)
Total Bilirubin: 0.5 mg/dL (ref 0.3–1.2)

## 2012-12-09 LAB — BASIC METABOLIC PANEL
CO2: 28 mEq/L (ref 19–32)
Calcium: 9.5 mg/dL (ref 8.4–10.5)
Creat: 0.87 mg/dL (ref 0.50–1.10)
Glucose, Bld: 86 mg/dL (ref 70–99)
Sodium: 138 mEq/L (ref 135–145)

## 2012-12-09 NOTE — Assessment & Plan Note (Signed)
Stable con't meds 

## 2012-12-09 NOTE — Patient Instructions (Addendum)
Preventive Care for Adults, Female A healthy lifestyle and preventive care can promote health and wellness. Preventive health guidelines for women include the following key practices.  A routine yearly physical is a good way to check with your caregiver about your health and preventive screening. It is a chance to share any concerns and updates on your health, and to receive a thorough exam.  Visit your dentist for a routine exam and preventive care every 6 months. Brush your teeth twice a day and floss once a day. Good oral hygiene prevents tooth decay and gum disease.  The frequency of eye exams is based on your age, health, family medical history, use of contact lenses, and other factors. Follow your caregiver's recommendations for frequency of eye exams.  Eat a healthy diet. Foods like vegetables, fruits, whole grains, low-fat dairy products, and lean protein foods contain the nutrients you need without too many calories. Decrease your intake of foods high in solid fats, added sugars, and salt. Eat the right amount of calories for you.Get information about a proper diet from your caregiver, if necessary.  Regular physical exercise is one of the most important things you can do for your health. Most adults should get at least 150 minutes of moderate-intensity exercise (any activity that increases your heart rate and causes you to sweat) each week. In addition, most adults need muscle-strengthening exercises on 2 or more days a week.  Maintain a healthy weight. The body mass index (BMI) is a screening tool to identify possible weight problems. It provides an estimate of body fat based on height and weight. Your caregiver can help determine your BMI, and can help you achieve or maintain a healthy weight.For adults 20 years and older:  A BMI below 18.5 is considered underweight.  A BMI of 18.5 to 24.9 is normal.  A BMI of 25 to 29.9 is considered overweight.  A BMI of 30 and above is  considered obese.  Maintain normal blood lipids and cholesterol levels by exercising and minimizing your intake of saturated fat. Eat a balanced diet with plenty of fruit and vegetables. Blood tests for lipids and cholesterol should begin at age 20 and be repeated every 5 years. If your lipid or cholesterol levels are high, you are over 50, or you are at high risk for heart disease, you may need your cholesterol levels checked more frequently.Ongoing high lipid and cholesterol levels should be treated with medicines if diet and exercise are not effective.  If you smoke, find out from your caregiver how to quit. If you do not use tobacco, do not start.  If you are pregnant, do not drink alcohol. If you are breastfeeding, be very cautious about drinking alcohol. If you are not pregnant and choose to drink alcohol, do not exceed 1 drink per day. One drink is considered to be 12 ounces (355 mL) of beer, 5 ounces (148 mL) of wine, or 1.5 ounces (44 mL) of liquor.  Avoid use of street drugs. Do not share needles with anyone. Ask for help if you need support or instructions about stopping the use of drugs.  High blood pressure causes heart disease and increases the risk of stroke. Your blood pressure should be checked at least every 1 to 2 years. Ongoing high blood pressure should be treated with medicines if weight loss and exercise are not effective.  If you are 55 to 65 years old, ask your caregiver if you should take aspirin to prevent strokes.  Diabetes   screening involves taking a blood sample to check your fasting blood sugar level. This should be done once every 3 years, after age 45, if you are within normal weight and without risk factors for diabetes. Testing should be considered at a younger age or be carried out more frequently if you are overweight and have at least 1 risk factor for diabetes.  Breast cancer screening is essential preventive care for women. You should practice "breast  self-awareness." This means understanding the normal appearance and feel of your breasts and may include breast self-examination. Any changes detected, no matter how small, should be reported to a caregiver. Women in their 20s and 30s should have a clinical breast exam (CBE) by a caregiver as part of a regular health exam every 1 to 3 years. After age 40, women should have a CBE every year. Starting at age 40, women should consider having a mammography (breast X-ray test) every year. Women who have a family history of breast cancer should talk to their caregiver about genetic screening. Women at a high risk of breast cancer should talk to their caregivers about having magnetic resonance imaging (MRI) and a mammography every year.  The Pap test is a screening test for cervical cancer. A Pap test can show cell changes on the cervix that might become cervical cancer if left untreated. A Pap test is a procedure in which cells are obtained and examined from the lower end of the uterus (cervix).  Women should have a Pap test starting at age 21.  Between ages 21 and 29, Pap tests should be repeated every 2 years.  Beginning at age 30, you should have a Pap test every 3 years as long as the past 3 Pap tests have been normal.  Some women have medical problems that increase the chance of getting cervical cancer. Talk to your caregiver about these problems. It is especially important to talk to your caregiver if a new problem develops soon after your last Pap test. In these cases, your caregiver may recommend more frequent screening and Pap tests.  The above recommendations are the same for women who have or have not gotten the vaccine for human papillomavirus (HPV).  If you had a hysterectomy for a problem that was not cancer or a condition that could lead to cancer, then you no longer need Pap tests. Even if you no longer need a Pap test, a regular exam is a good idea to make sure no other problems are  starting.  If you are between ages 65 and 70, and you have had normal Pap tests going back 10 years, you no longer need Pap tests. Even if you no longer need a Pap test, a regular exam is a good idea to make sure no other problems are starting.  If you have had past treatment for cervical cancer or a condition that could lead to cancer, you need Pap tests and screening for cancer for at least 20 years after your treatment.  If Pap tests have been discontinued, risk factors (such as a new sexual partner) need to be reassessed to determine if screening should be resumed.  The HPV test is an additional test that may be used for cervical cancer screening. The HPV test looks for the virus that can cause the cell changes on the cervix. The cells collected during the Pap test can be tested for HPV. The HPV test could be used to screen women aged 30 years and older, and should   be used in women of any age who have unclear Pap test results. After the age of 30, women should have HPV testing at the same frequency as a Pap test.  Colorectal cancer can be detected and often prevented. Most routine colorectal cancer screening begins at the age of 50 and continues through age 75. However, your caregiver may recommend screening at an earlier age if you have risk factors for colon cancer. On a yearly basis, your caregiver may provide home test kits to check for hidden blood in the stool. Use of a small camera at the end of a tube, to directly examine the colon (sigmoidoscopy or colonoscopy), can detect the earliest forms of colorectal cancer. Talk to your caregiver about this at age 50, when routine screening begins. Direct examination of the colon should be repeated every 5 to 10 years through age 75, unless early forms of pre-cancerous polyps or small growths are found.  Hepatitis C blood testing is recommended for all people born from 1945 through 1965 and any individual with known risks for hepatitis C.  Practice  safe sex. Use condoms and avoid high-risk sexual practices to reduce the spread of sexually transmitted infections (STIs). STIs include gonorrhea, chlamydia, syphilis, trichomonas, herpes, HPV, and human immunodeficiency virus (HIV). Herpes, HIV, and HPV are viral illnesses that have no cure. They can result in disability, cancer, and death. Sexually active women aged 25 and younger should be checked for chlamydia. Older women with new or multiple partners should also be tested for chlamydia. Testing for other STIs is recommended if you are sexually active and at increased risk.  Osteoporosis is a disease in which the bones lose minerals and strength with aging. This can result in serious bone fractures. The risk of osteoporosis can be identified using a bone density scan. Women ages 65 and over and women at risk for fractures or osteoporosis should discuss screening with their caregivers. Ask your caregiver whether you should take a calcium supplement or vitamin D to reduce the rate of osteoporosis.  Menopause can be associated with physical symptoms and risks. Hormone replacement therapy is available to decrease symptoms and risks. You should talk to your caregiver about whether hormone replacement therapy is right for you.  Use sunscreen with sun protection factor (SPF) of 30 or more. Apply sunscreen liberally and repeatedly throughout the day. You should seek shade when your shadow is shorter than you. Protect yourself by wearing long sleeves, pants, a wide-brimmed hat, and sunglasses year round, whenever you are outdoors.  Once a month, do a whole body skin exam, using a mirror to look at the skin on your back. Notify your caregiver of new moles, moles that have irregular borders, moles that are larger than a pencil eraser, or moles that have changed in shape or color.  Stay current with required immunizations.  Influenza. You need a dose every fall (or winter). The composition of the flu vaccine  changes each year, so being vaccinated once is not enough.  Pneumococcal polysaccharide. You need 1 to 2 doses if you smoke cigarettes or if you have certain chronic medical conditions. You need 1 dose at age 65 (or older) if you have never been vaccinated.  Tetanus, diphtheria, pertussis (Tdap, Td). Get 1 dose of Tdap vaccine if you are younger than age 65, are over 65 and have contact with an infant, are a healthcare worker, are pregnant, or simply want to be protected from whooping cough. After that, you need a Td   booster dose every 10 years. Consult your caregiver if you have not had at least 3 tetanus and diphtheria-containing shots sometime in your life or have a deep or dirty wound.  HPV. You need this vaccine if you are a woman age 26 or younger. The vaccine is given in 3 doses over 6 months.  Measles, mumps, rubella (MMR). You need at least 1 dose of MMR if you were born in 1957 or later. You may also need a second dose.  Meningococcal. If you are age 19 to 21 and a first-year college student living in a residence hall, or have one of several medical conditions, you need to get vaccinated against meningococcal disease. You may also need additional booster doses.  Zoster (shingles). If you are age 60 or older, you should get this vaccine.  Varicella (chickenpox). If you have never had chickenpox or you were vaccinated but received only 1 dose, talk to your caregiver to find out if you need this vaccine.  Hepatitis A. You need this vaccine if you have a specific risk factor for hepatitis A virus infection or you simply wish to be protected from this disease. The vaccine is usually given as 2 doses, 6 to 18 months apart.  Hepatitis B. You need this vaccine if you have a specific risk factor for hepatitis B virus infection or you simply wish to be protected from this disease. The vaccine is given in 3 doses, usually over 6 months. Preventive Services / Frequency Ages 19 to 39  Blood  pressure check.** / Every 1 to 2 years.  Lipid and cholesterol check.** / Every 5 years beginning at age 20.  Clinical breast exam.** / Every 3 years for women in their 20s and 30s.  Pap test.** / Every 2 years from ages 21 through 29. Every 3 years starting at age 30 through age 65 or 70 with a history of 3 consecutive normal Pap tests.  HPV screening.** / Every 3 years from ages 30 through ages 65 to 70 with a history of 3 consecutive normal Pap tests.  Hepatitis C blood test.** / For any individual with known risks for hepatitis C.  Skin self-exam. / Monthly.  Influenza immunization.** / Every year.  Pneumococcal polysaccharide immunization.** / 1 to 2 doses if you smoke cigarettes or if you have certain chronic medical conditions.  Tetanus, diphtheria, pertussis (Tdap, Td) immunization. / A one-time dose of Tdap vaccine. After that, you need a Td booster dose every 10 years.  HPV immunization. / 3 doses over 6 months, if you are 26 and younger.  Measles, mumps, rubella (MMR) immunization. / You need at least 1 dose of MMR if you were born in 1957 or later. You may also need a second dose.  Meningococcal immunization. / 1 dose if you are age 19 to 21 and a first-year college student living in a residence hall, or have one of several medical conditions, you need to get vaccinated against meningococcal disease. You may also need additional booster doses.  Varicella immunization.** / Consult your caregiver.  Hepatitis A immunization.** / Consult your caregiver. 2 doses, 6 to 18 months apart.  Hepatitis B immunization.** / Consult your caregiver. 3 doses usually over 6 months. Ages 40 to 64  Blood pressure check.** / Every 1 to 2 years.  Lipid and cholesterol check.** / Every 5 years beginning at age 20.  Clinical breast exam.** / Every year after age 40.  Mammogram.** / Every year beginning at age 40   and continuing for as long as you are in good health. Consult with your  caregiver.  Pap test.** / Every 3 years starting at age 30 through age 65 or 70 with a history of 3 consecutive normal Pap tests.  HPV screening.** / Every 3 years from ages 30 through ages 65 to 70 with a history of 3 consecutive normal Pap tests.  Fecal occult blood test (FOBT) of stool. / Every year beginning at age 50 and continuing until age 75. You may not need to do this test if you get a colonoscopy every 10 years.  Flexible sigmoidoscopy or colonoscopy.** / Every 5 years for a flexible sigmoidoscopy or every 10 years for a colonoscopy beginning at age 50 and continuing until age 75.  Hepatitis C blood test.** / For all people born from 1945 through 1965 and any individual with known risks for hepatitis C.  Skin self-exam. / Monthly.  Influenza immunization.** / Every year.  Pneumococcal polysaccharide immunization.** / 1 to 2 doses if you smoke cigarettes or if you have certain chronic medical conditions.  Tetanus, diphtheria, pertussis (Tdap, Td) immunization.** / A one-time dose of Tdap vaccine. After that, you need a Td booster dose every 10 years.  Measles, mumps, rubella (MMR) immunization. / You need at least 1 dose of MMR if you were born in 1957 or later. You may also need a second dose.  Varicella immunization.** / Consult your caregiver.  Meningococcal immunization.** / Consult your caregiver.  Hepatitis A immunization.** / Consult your caregiver. 2 doses, 6 to 18 months apart.  Hepatitis B immunization.** / Consult your caregiver. 3 doses, usually over 6 months. Ages 65 and over  Blood pressure check.** / Every 1 to 2 years.  Lipid and cholesterol check.** / Every 5 years beginning at age 20.  Clinical breast exam.** / Every year after age 40.  Mammogram.** / Every year beginning at age 40 and continuing for as long as you are in good health. Consult with your caregiver.  Pap test.** / Every 3 years starting at age 30 through age 65 or 70 with a 3  consecutive normal Pap tests. Testing can be stopped between 65 and 70 with 3 consecutive normal Pap tests and no abnormal Pap or HPV tests in the past 10 years.  HPV screening.** / Every 3 years from ages 30 through ages 65 or 70 with a history of 3 consecutive normal Pap tests. Testing can be stopped between 65 and 70 with 3 consecutive normal Pap tests and no abnormal Pap or HPV tests in the past 10 years.  Fecal occult blood test (FOBT) of stool. / Every year beginning at age 50 and continuing until age 75. You may not need to do this test if you get a colonoscopy every 10 years.  Flexible sigmoidoscopy or colonoscopy.** / Every 5 years for a flexible sigmoidoscopy or every 10 years for a colonoscopy beginning at age 50 and continuing until age 75.  Hepatitis C blood test.** / For all people born from 1945 through 1965 and any individual with known risks for hepatitis C.  Osteoporosis screening.** / A one-time screening for women ages 65 and over and women at risk for fractures or osteoporosis.  Skin self-exam. / Monthly.  Influenza immunization.** / Every year.  Pneumococcal polysaccharide immunization.** / 1 dose at age 65 (or older) if you have never been vaccinated.  Tetanus, diphtheria, pertussis (Tdap, Td) immunization. / A one-time dose of Tdap vaccine if you are over   65 and have contact with an infant, are a healthcare worker, or simply want to be protected from whooping cough. After that, you need a Td booster dose every 10 years.  Varicella immunization.** / Consult your caregiver.  Meningococcal immunization.** / Consult your caregiver.  Hepatitis A immunization.** / Consult your caregiver. 2 doses, 6 to 18 months apart.  Hepatitis B immunization.** / Check with your caregiver. 3 doses, usually over 6 months. ** Family history and personal history of risk and conditions may change your caregiver's recommendations. Document Released: 08/04/2001 Document Revised: 08/31/2011  Document Reviewed: 11/03/2010 [employer] Patient Information 2014 [employer].  

## 2012-12-09 NOTE — Progress Notes (Signed)
Subjective:    Candice Bennett is a 67 y.o. female who presents for a welcome to Medicare exam.   Cardiac risk factors: advanced age (older than 4 for men, 50 for women).  Activities of Daily Living  In your present state of health, do you have any difficulty performing the following activities?:  Preparing food and eating?: No Bathing yourself: No Getting dressed: No Using the toilet:No Moving around from place to place: No In the past year have you fallen or had a near fall?:No  Current exercise habits: walk 2-3 x a week   Dietary issues discussed: na   Depression Screen (Note: if answer to either of the following is "Yes", then a more complete depression screening is indicated)  Q1: Over the past two weeks, have you felt down, depressed or hopeless?no Q2: Over the past two weeks, have you felt little interest or pleasure in doing things? no   The following portions of the patient's history were reviewed and updated as appropriate:  She  has a past medical history of Unspecified essential hypertension; Follicular lymphoma; OSA (obstructive sleep apnea); and Sarcoidosis. She  does not have any pertinent problems on file. She  has past surgical history that includes Carpal tunnel release; Tubal ligation (1978); Lymph node biopsy; Abdominal hysterectomy (1983); port a cath insertion (2005); and Port-a-cath removal (2006). Her family history includes Diabetes in her brother; Heart disease in her mother; Heart disease (age of onset: 1) in her sister; Hypertension in her sister; and Lung cancer in her father.  There is no history of Colon cancer. She  reports that she has never smoked. She has never used smokeless tobacco. She reports that she does not drink alcohol or use illicit drugs. She has a current medication list which includes the following prescription(s): amlodipine, labetalol, and mometasone. Current Outpatient Prescriptions on File Prior to Visit  Medication Sig  Dispense Refill  . amLODipine (NORVASC) 5 MG tablet TAKE 1 TAB BY MOUTH DAILY--- OFFICE VISIT DUE NOW  30 tablet  0  . labetalol (NORMODYNE) 100 MG tablet TAKE 1 TABLET BY MOUTH 2 TIMES DAILY.  180 tablet  0  . mometasone (ELOCON) 0.1 % cream USED SPARINGLY ONCE DAILY  15 g  0   No current facility-administered medications on file prior to visit.   She is allergic to iohexol.. Review of Systems Review of Systems  Constitutional: Negative for activity change, appetite change and fatigue.  HENT: Negative for hearing loss, congestion, tinnitus and ear discharge.  dentist  q4h Eyes: Negative for visual disturbance (see optho q3y -- vision corrected to 20/20 with glasses).  Respiratory: Negative for cough, chest tightness and shortness of breath.   Cardiovascular: Negative for chest pain, palpitations and leg swelling.  Gastrointestinal: Negative for abdominal pain, diarrhea, constipation and abdominal distention.  Genitourinary: Negative for urgency, frequency, decreased urine volume and difficulty urinating.  Musculoskeletal: Negative for back pain, arthralgias and gait problem.  Skin: Negative for color change, pallor and rash.  Neurological: Negative for dizziness, light-headedness, numbness and headaches.  Hematological: Negative for adenopathy. Does not bruise/bleed easily.  Psychiatric/Behavioral: Negative for suicidal ideas, confusion, sleep disturbance, self-injury, dysphoric mood, decreased concentration and agitation.        Objective:     Vision by Snellen chart: right eye 20/25    Left eye 20/30     Both 20/20 with correction Blood pressure 105/68, pulse 81, temperature 98.3 F (36.8 C), temperature source Oral, height 5' 2.5" (1.588 m), weight 199  lb 3.2 oz (90.357 kg), SpO2 96.00%. Body mass index is 35.83 kg/(m^2). BP 105/68  Pulse 81  Temp(Src) 98.3 F (36.8 C) (Oral)  Ht 5' 2.5" (1.588 m)  Wt 199 lb 3.2 oz (90.357 kg)  BMI 35.83 kg/m2  SpO2 96% General  appearance: alert, cooperative, appears stated age and no distress Head: Normocephalic, without obvious abnormality, atraumatic Eyes: conjunctivae/corneas clear. PERRL, EOM's intact. Fundi benign. Ears: normal TM's and external ear canals both ears Nose: Nares normal. Septum midline. Mucosa normal. No drainage or sinus tenderness. Throat: lips, mucosa, and tongue normal; teeth and gums normal Neck: no adenopathy, no carotid bruit, no JVD, supple, symmetrical, trachea midline and thyroid not enlarged, symmetric, no tenderness/mass/nodules Back: symmetric, no curvature. ROM normal. No CVA tenderness. Lungs: clear to auscultation bilaterally Breasts: normal appearance, no masses or tenderness Heart: regular rate and rhythm, S1, S2 normal, no murmur, click, rub or gallop Abdomen: soft, non-tender; bowel sounds normal; no masses,  no organomegaly Pelvic: deferred--  gyn Extremities: extremities normal, atraumatic, no cyanosis or edema Pulses: 2+ and symmetric Skin: Skin color, texture, turgor normal. No rashes or lesions Lymph nodes: Cervical, supraclavicular, and axillary nodes normal. Neurologic: Alert and oriented X 3, normal strength and tone. Normal symmetric reflexes. Normal coordination and gait Psych---no anxiety, no depression       Assessment:    cpe     Plan:     During the course of the visit the patient was educated and counseled about appropriate screening and preventive services including:   Screening electrocardiogram  Screening mammography  Bone densitometry screening  Colorectal cancer screening  Diabetes screening  Glaucoma screening  Advanced directives: has an advanced directive - a copy HAS NOT been provided.  Patient Instructions (the written plan) was given to the patient.

## 2012-12-09 NOTE — Assessment & Plan Note (Signed)
Per onc  

## 2013-01-03 ENCOUNTER — Other Ambulatory Visit: Payer: Self-pay | Admitting: Family Medicine

## 2013-01-23 ENCOUNTER — Ambulatory Visit
Admission: RE | Admit: 2013-01-23 | Discharge: 2013-01-23 | Disposition: A | Discharge status: Home / Self Care | Payer: Medicare Other | Source: Ambulatory Visit

## 2013-01-23 DIAGNOSIS — Z1231 Encounter for screening mammogram for malignant neoplasm of breast: Secondary | ICD-10-CM

## 2013-03-16 ENCOUNTER — Other Ambulatory Visit: Payer: Self-pay | Admitting: Family Medicine

## 2013-09-18 ENCOUNTER — Other Ambulatory Visit: Payer: Self-pay | Admitting: Family Medicine

## 2013-10-14 ENCOUNTER — Other Ambulatory Visit: Payer: Self-pay | Admitting: Family Medicine

## 2013-11-09 DIAGNOSIS — H2510 Age-related nuclear cataract, unspecified eye: Secondary | ICD-10-CM | POA: Diagnosis not present

## 2013-11-16 ENCOUNTER — Other Ambulatory Visit: Payer: Self-pay | Admitting: Family Medicine

## 2013-11-29 ENCOUNTER — Telehealth: Payer: Self-pay | Admitting: Family Medicine

## 2013-11-29 NOTE — Telephone Encounter (Signed)
Caller name: Claressa Relation to pt: patient Call back number: [phone] Pharmacy:[id number] - [place], [place].    Reason for call: patient called to request a refill for labetalol (NORMODYNE) 100 MG tablet until her apt on 01/30/2014. Please advise

## 2013-11-30 MED ORDER — LABETALOL HCL 100 MG PO TABS: ORAL_TABLET | ORAL | Status: DC

## 2013-12-02 ENCOUNTER — Other Ambulatory Visit: Payer: Self-pay | Admitting: Family Medicine

## 2013-12-19 ENCOUNTER — Encounter: Payer: Medicare Other | Admitting: Family Medicine

## 2013-12-27 ENCOUNTER — Other Ambulatory Visit: Payer: Self-pay | Admitting: Family Medicine

## 2014-01-24 ENCOUNTER — Other Ambulatory Visit: Payer: Self-pay | Admitting: Family Medicine

## 2014-01-30 ENCOUNTER — Ambulatory Visit (INDEPENDENT_AMBULATORY_CARE_PROVIDER_SITE_OTHER): Payer: Medicare Other | Admitting: Family Medicine

## 2014-01-30 ENCOUNTER — Encounter: Payer: Self-pay | Admitting: Family Medicine

## 2014-01-30 VITALS — BP 110/68 | HR 67 | Temp 98.1°F | Ht 62.5 in | Wt 200.2 lb

## 2014-01-30 DIAGNOSIS — M5430 Sciatica, unspecified side: Secondary | ICD-10-CM

## 2014-01-30 DIAGNOSIS — H9190 Unspecified hearing loss, unspecified ear: Secondary | ICD-10-CM | POA: Diagnosis not present

## 2014-01-30 DIAGNOSIS — Z23 Encounter for immunization: Secondary | ICD-10-CM | POA: Diagnosis not present

## 2014-01-30 DIAGNOSIS — E669 Obesity, unspecified: Secondary | ICD-10-CM | POA: Insufficient documentation

## 2014-01-30 DIAGNOSIS — I10 Essential (primary) hypertension: Secondary | ICD-10-CM | POA: Diagnosis not present

## 2014-01-30 DIAGNOSIS — M79644 Pain in right finger(s): Secondary | ICD-10-CM | POA: Insufficient documentation

## 2014-01-30 DIAGNOSIS — Z0000 Encounter for general adult medical examination without abnormal findings: Secondary | ICD-10-CM | POA: Diagnosis not present

## 2014-01-30 DIAGNOSIS — E2839 Other primary ovarian failure: Secondary | ICD-10-CM

## 2014-01-30 DIAGNOSIS — M5440 Lumbago with sciatica, unspecified side: Secondary | ICD-10-CM

## 2014-01-30 DIAGNOSIS — H9191 Unspecified hearing loss, right ear: Secondary | ICD-10-CM

## 2014-01-30 DIAGNOSIS — M79609 Pain in unspecified limb: Secondary | ICD-10-CM

## 2014-01-30 LAB — HEPATIC FUNCTION PANEL
ALBUMIN: 4.2 g/dL (ref 3.5–5.2)
ALK PHOS: 65 U/L (ref 39–117)
ALT: 19 U/L (ref 0–35)
AST: 18 U/L (ref 0–37)
Bilirubin, Direct: 0 mg/dL (ref 0.0–0.3)
TOTAL PROTEIN: 8 g/dL (ref 6.0–8.3)
Total Bilirubin: 1 mg/dL (ref 0.2–1.2)

## 2014-01-30 LAB — POCT URINALYSIS DIPSTICK
BILIRUBIN UA: NEGATIVE
Blood, UA: NEGATIVE
GLUCOSE UA: NEGATIVE
Ketones, UA: NEGATIVE
LEUKOCYTES UA: NEGATIVE
NITRITE UA: NEGATIVE
Protein, UA: NEGATIVE
Spec Grav, UA: 1.02
Urobilinogen, UA: 0.2
pH, UA: 6

## 2014-01-30 LAB — BASIC METABOLIC PANEL
BUN: 10 mg/dL (ref 6–23)
CO2: 26 meq/L (ref 19–32)
Calcium: 9.4 mg/dL (ref 8.4–10.5)
Chloride: 103 mEq/L (ref 96–112)
Creatinine, Ser: 0.8 mg/dL (ref 0.4–1.2)
GFR: 88.33 mL/min (ref >60.00)
Glucose, Bld: 95 mg/dL (ref 70–99)
POTASSIUM: 3.9 meq/L (ref 3.5–5.1)
SODIUM: 138 meq/L (ref 135–145)

## 2014-01-30 LAB — CBC WITH DIFFERENTIAL/PLATELET
BASOS PCT: 0.3 % (ref 0.0–3.0)
Basophils Absolute: 0 10*3/uL (ref 0.0–0.1)
EOS ABS: 0.1 10*3/uL (ref 0.0–0.7)
Eosinophils Relative: 2.1 % (ref 0.0–5.0)
HEMATOCRIT: 38.8 % (ref 36.0–46.0)
HEMOGLOBIN: 12.1 g/dL (ref 12.0–15.0)
Lymphocytes Relative: 46.1 % — ABNORMAL HIGH (ref 12.0–46.0)
Lymphs Abs: 2.8 10*3/uL (ref 0.7–4.0)
MCHC: 31.2 g/dL (ref 30.0–36.0)
MCV: 81.9 fl (ref 78.0–100.0)
MONOS PCT: 5.2 % (ref 3.0–12.0)
Monocytes Absolute: 0.3 10*3/uL (ref 0.1–1.0)
NEUTROS ABS: 2.8 10*3/uL (ref 1.4–7.7)
Neutrophils Relative %: 46.3 % (ref 43.0–77.0)
Platelets: 349 10*3/uL (ref 150.0–400.0)
RBC: 4.74 Mil/uL (ref 3.87–5.11)
RDW: 14.6 % (ref 11.5–15.5)
WBC: 6.1 10*3/uL (ref 4.0–10.5)

## 2014-01-30 LAB — LIPID PANEL
Cholesterol: 194 mg/dL (ref 0–200)
HDL: 62.7 mg/dL (ref >39.00)
LDL Cholesterol: 113 mg/dL — ABNORMAL HIGH (ref 0–99)
NONHDL: 131.3
Total CHOL/HDL Ratio: 3
Triglycerides: 90 mg/dL (ref 0.0–149.0)
VLDL: 18 mg/dL (ref 0.0–40.0)

## 2014-01-30 MED ORDER — LABETALOL HCL 100 MG PO TABS: ORAL_TABLET | ORAL | Status: DC

## 2014-01-30 MED ORDER — AMLODIPINE BESYLATE 5 MG PO TABS: ORAL_TABLET | ORAL | Status: DC

## 2014-01-30 NOTE — Progress Notes (Signed)
Subjective:    Candice Bennett is a 68 y.o. female who presents for Medicare Initial preventive examination.  Preventive Screening-Counseling & Management  Tobacco History  Smoking status  . Never Smoker   Smokeless tobacco  . Never Used     Problems Prior to Visit 1. none  Current Problems (verified) Patient Active Problem List   Diagnosis Date Noted  . Stool incontinence 12/31/2010  . HIP PAIN, RIGHT 02/10/2010  . BACK PAIN, LUMBAR 02/10/2010  . B12 DEFICIENCY 04/19/2009  . FATIGUE 03/26/2009  . CONTACT DERMATITIS&OTHER ECZEMA DUE UNSPEC CAUSE 11/21/2008  . URI 04/06/2008  . POSTMENOPAUSAL SYNDROME 09/16/2007  . RECTAL BLEEDING, HX OF 09/16/2007  . ARTHROSCOPY, RIGHT KNEE, HX OF 09/16/2007  . SARCOIDOSIS 06/01/2007  . NON-HODGKIN'S LYMPHOMA, HX OF 06/01/2007  . OBSTRUCTIVE SLEEP APNEA 11/19/2006  . HYPERTENSION 11/19/2006    Medications Prior to Visit Current Outpatient Prescriptions on File Prior to Visit  Medication Sig Dispense Refill  . amLODipine (NORVASC) 5 MG tablet TAKE 1 TAB BY MOUTH DAILY--OFFICE VISIT DUE NOW  30 tablet  0  . labetalol (NORMODYNE) 100 MG tablet 1 tab by mouth twice daily--Office visit due now  180 tablet  0  . mometasone (ELOCON) 0.1 % cream USED SPARINGLY ONCE DAILY  15 g  0   No current facility-administered medications on file prior to visit.    Current Medications (verified) Current Outpatient Prescriptions  Medication Sig Dispense Refill  . amLODipine (NORVASC) 5 MG tablet TAKE 1 TAB BY MOUTH DAILY--OFFICE VISIT DUE NOW  30 tablet  0  . labetalol (NORMODYNE) 100 MG tablet 1 tab by mouth twice daily--Office visit due now  180 tablet  0  . mometasone (ELOCON) 0.1 % cream USED SPARINGLY ONCE DAILY  15 g  0   No current facility-administered medications for this visit.     Allergies (verified) Iohexol   PAST HISTORY  Family History Family History  Problem Relation Age of Onset  . Lung cancer Father   . Diabetes  Brother   . Heart disease Mother   . Heart disease Sister 64    chf  . Hypertension Sister   . Colon cancer Neg Hx     Social History History  Substance Use Topics  . Smoking status: Never Smoker   . Smokeless tobacco: Never Used  . Alcohol Use: No     Are there smokers in your home (other than you)? No  Risk Factors Current exercise habits: The patient does not participate in regular exercise at present.  Dietary issues discussed: na   Cardiac risk factors: advanced age (older than 61 for men, 42 for women), hypertension, obesity (BMI >= 30 kg/m2) and sedentary lifestyle.  Depression Screen (Note: if answer to either of the following is "Yes", a more complete depression screening is indicated)   Over the past 2 weeks, have you felt down, depressed or hopeless? No  Over the past 2 weeks, have you felt little interest or pleasure in doing things? No  Have you lost interest or pleasure in daily life? No  Do you often feel hopeless? No  Do you cry easily over simple problems? No  Activities of Daily Living In your present state of health, do you have any difficulty performing the following activities?:  Driving? No Managing money?  No Feeding yourself? No Getting from bed to chair? No Climbing a flight of stairs? No Preparing food and eating?: No Bathing or showering? No Getting dressed: No Getting to the  toilet? No Using the toilet:No Moving around from place to place: No In the past year have you fallen or had a near fall?:No   Are you sexually active?  No  Do you have more than one partner?  No  Hearing Difficulties: yes Do you often ask people to speak up or repeat themselves?yes Do you experience ringing or noises in your ears? No Do you have difficulty understanding soft or whispered voices? yes   Do you feel that you have a problem with memory? No  Do you often misplace items? No  Do you feel safe at home?  Yes  Cognitive Testing  Alert? Yes  Normal  Appearance?Yes  Oriented to person? Yes  Place? Yes   Time? Yes  Recall of three objects?  Yes  Can perform simple calculations? Yes  Displays appropriate judgment?Yes  Can read the correct time from a watch face?Yes   Advanced Directives have been discussed with the patient? Yes  List the Names of Other Physician/Practitioners you currently use: 1.  [facility]- [facility] 2.  Dentist-  Sammie Bench any recent Medical Services you may have received from other than [facility] providers in the past year (date may be approximate).  Immunization History  Administered Date(s) Administered  . Influenza Whole 03/26/2009  . Pneumococcal Polysaccharide-23 12/09/2012  . Td 08/10/2000  . Tdap 04/07/2011  . Zoster 02/10/2010    Screening Tests Health Maintenance  Topic Date Due  . Influenza Vaccine  04/01/2014 (Originally 01/20/2014)  . Pap Smear  01/04/2015  . Mammogram  01/24/2015  . Tetanus/tdap  04/06/2021  . Colonoscopy  08/19/2021  . Pneumococcal Polysaccharide Vaccine Age 34 And Over  Completed  . Zostavax  Completed    All answers were reviewed with the patient and necessary referrals were made:  Garnet Koyanagi, DO   01/30/2014   History reviewed:  She  has a past medical history of Unspecified essential hypertension; Follicular lymphoma; OSA (obstructive sleep apnea); and Sarcoidosis. She  does not have any pertinent problems on file. She  has past surgical history that includes Carpal tunnel release; Tubal ligation (1978); Lymph node biopsy; Abdominal hysterectomy (1983); port a cath insertion (2005); and Port-a-cath removal (2006). Her family history includes Diabetes in her brother; Heart disease in her mother; Heart disease (age of onset: 8) in her sister; Hypertension in her sister; Lung cancer in her father. There is no history of Colon cancer. She  reports that she has never smoked. She has never used smokeless tobacco. She reports that she does not drink alcohol or use illicit  drugs. She has a current medication list which includes the following prescription(s): amlodipine, labetalol, and mometasone. Current Outpatient Prescriptions on File Prior to Visit  Medication Sig Dispense Refill  . mometasone (ELOCON) 0.1 % cream USED SPARINGLY ONCE DAILY  15 g  0   No current facility-administered medications on file prior to visit.   She is allergic to iohexol.  Review of Systems  Review of Systems  Constitutional: Negative for activity change, appetite change and fatigue.  HENT: Negative for hearing loss, congestion, tinnitus and ear discharge.   Eyes: Negative for visual disturbance (see optho q1y -- vision corrected to 20/20 with glasses).  Respiratory: Negative for cough, chest tightness and shortness of breath.   Cardiovascular: Negative for chest pain, palpitations and leg swelling.  Gastrointestinal: Negative for abdominal pain, diarrhea, constipation and abdominal distention.  Genitourinary: Negative for urgency, frequency, decreased urine volume and difficulty urinating.  Musculoskeletal: Negative  for back pain, arthralgias and gait problem.  Skin: Negative for color change, pallor and rash.  Neurological: Negative for dizziness, light-headedness, numbness and headaches.  Hematological: Negative for adenopathy. Does not bruise/bleed easily.  Psychiatric/Behavioral: Negative for suicidal ideas, confusion, sleep disturbance, self-injury, dysphoric mood, decreased concentration and agitation.  Pt is able to read and write and can do all ADLs No risk for falling No abuse/ violence in home      Objective:     Vision by Snellen chart:opth Body mass index is 36.01 kg/(m^2). BP 110/68  Pulse 67  Temp(Src) 98.1 F (36.7 C) (Oral)  Ht 5' 2.5" (1.588 m)  Wt 200 lb 3.2 oz (90.81 kg)  BMI 36.01 kg/m2  SpO2 99%  BP 110/68  Pulse 67  Temp(Src) 98.1 F (36.7 C) (Oral)  Ht 5' 2.5" (1.588 m)  Wt 200 lb 3.2 oz (90.81 kg)  BMI 36.01 kg/m2  SpO2  99% General appearance: alert, cooperative, appears stated age and no distress Head: Normocephalic, without obvious abnormality, atraumatic Eyes: conjunctivae/corneas clear. PERRL, EOM's intact. Fundi benign. Ears: normal TM's and external ear canals both ears Nose: Nares normal. Septum midline. Mucosa normal. No drainage or sinus tenderness. Throat: lips, mucosa, and tongue normal; teeth and gums normal Neck: no adenopathy, no carotid bruit, no JVD, supple, symmetrical, trachea midline and thyroid not enlarged, symmetric, no tenderness/mass/nodules Back: symmetric, no curvature. ROM normal. No CVA tenderness. Lungs: clear to auscultation bilaterally Breasts: normal appearance, no masses or tenderness Heart: regular rate and rhythm, S1, S2 normal, no murmur, click, rub or gallop Abdomen: soft, non-tender; bowel sounds normal; no masses,  no organomegaly Pelvic: deferred Extremities: extremities normal, atraumatic, no cyanosis or edema-----R thumb --- pain in PIP ---and along ext tendon Pulses: 2+ and symmetric Skin: Skin color, texture, turgor normal. No rashes or lesions Lymph nodes: Cervical, supraclavicular, and axillary nodes normal. Neurologic: Alert and oriented X 3, normal strength and tone. Normal symmetric reflexes. Normal coordination and gait Psych-- no depression , no anxiety      Assessment:     cpe       Plan:     During the course of the visit the patient was educated and counseled about appropriate screening and preventive services including:    Pneumococcal vaccine   Influenza vaccine  Screening mammography  Screening Pap smear and pelvic exam   Bone densitometry screening  Colorectal cancer screening  Diabetes screening  Glaucoma screening  Advanced directives: has an advanced directive - a copy HAS NOT been provided.  Diet review for nutrition referral? Yes ____  Not Indicated _x___   Patient Instructions (the written plan) was given to the  patient.  Medicare Attestation I have personally reviewed: The patient's medical and social history Their use of alcohol, tobacco or illicit drugs Their current medications and supplements The patient's functional ability including ADLs,fall risks, home safety risks, cognitive, and hearing and visual impairment Diet and physical activities Evidence for depression or mood disorders  The patient's weight, height, BMI, and visual acuity have been recorded in the chart.  I have made referrals, counseling, and provided education to the patient based on review of the above and I have provided the patient with a written personalized care plan for preventive services.    1. Estrogen deficiency  - DG Bone Density; Future  2. Hearing loss, right  - Ambulatory referral to Audiology  3. Essential hypertension Stable, con't meds - Basic metabolic panel - CBC with Differential - Hepatic function panel -  Lipid panel - POCT urinalysis dipstick - amLODipine (NORVASC) 5 MG tablet; TAKE 1 TAB BY MOUTH DAY  Dispense: 90 tablet; Refill: 3 - labetalol (NORMODYNE) 100 MG tablet; 1 tab by mouth twice daily  Dispense: 180 tablet; Refill: 3  4. Medicare annual wellness visit, initial    5. Pain of right thumb con't brace and call if it does not improve and we will refer her to hand surgeon  6. Low back pain with sciatica, sciatica laterality unspecified, unspecified back pain laterality Doesn't seem to bother her now  7. Need for pneumococcal vaccination   - Pneumococcal conjugate vaccine 13-valent  Garnet Koyanagi, DO   01/30/2014

## 2014-01-30 NOTE — Assessment & Plan Note (Signed)
Started acting up again but has eased

## 2014-01-30 NOTE — Progress Notes (Signed)
Pre visit review using our clinic review tool, if applicable. No additional management support is needed unless otherwise documented below in the visit note. 

## 2014-01-30 NOTE — Patient Instructions (Signed)
Preventive Care for Adults A healthy lifestyle and preventive care can promote health and wellness. Preventive health guidelines for women include the following key practices.  A routine yearly physical is a good way to check with your health care provider about your health and preventive screening. It is a chance to share any concerns and updates on your health and to receive a thorough exam.  Visit your dentist for a routine exam and preventive care every 6 months. Brush your teeth twice a day and floss once a day. Good oral hygiene prevents tooth decay and gum disease.  The frequency of eye exams is based on your age, health, family medical history, use of contact lenses, and other factors. Follow your health care provider's recommendations for frequency of eye exams.  Eat a healthy diet. Foods like vegetables, fruits, whole grains, low-fat dairy products, and lean protein foods contain the nutrients you need without too many calories. Decrease your intake of foods high in solid fats, added sugars, and salt. Eat the right amount of calories for you.Get information about a proper diet from your health care provider, if necessary.  Regular physical exercise is one of the most important things you can do for your health. Most adults should get at least 150 minutes of moderate-intensity exercise (any activity that increases your heart rate and causes you to sweat) each week. In addition, most adults need muscle-strengthening exercises on 2 or more days a week.  Maintain a healthy weight. The body mass index (BMI) is a screening tool to identify possible weight problems. It provides an estimate of body fat based on height and weight. Your health care provider can find your BMI and can help you achieve or maintain a healthy weight.For adults 20 years and older:  A BMI below 18.5 is considered underweight.  A BMI of 18.5 to 24.9 is normal.  A BMI of 25 to 29.9 is considered overweight.  A BMI of  30 and above is considered obese.  Maintain normal blood lipids and cholesterol levels by exercising and minimizing your intake of saturated fat. Eat a balanced diet with plenty of fruit and vegetables. Blood tests for lipids and cholesterol should begin at age 76 and be repeated every 5 years. If your lipid or cholesterol levels are high, you are over 50, or you are at high risk for heart disease, you may need your cholesterol levels checked more frequently.Ongoing high lipid and cholesterol levels should be treated with medicines if diet and exercise are not working.  If you smoke, find out from your health care provider how to quit. If you do not use tobacco, do not start.  Lung cancer screening is recommended for adults aged 22-80 years who are at high risk for developing lung cancer because of a history of smoking. A yearly low-dose CT scan of the lungs is recommended for people who have at least a 30-pack-year history of smoking and are a current smoker or have quit within the past 15 years. A pack year of smoking is smoking an average of 1 pack of cigarettes a day for 1 year (for example: 1 pack a day for 30 years or 2 packs a day for 15 years). Yearly screening should continue until the smoker has stopped smoking for at least 15 years. Yearly screening should be stopped for people who develop a health problem that would prevent them from having lung cancer treatment.  If you are pregnant, do not drink alcohol. If you are breastfeeding,  be very cautious about drinking alcohol. If you are not pregnant and choose to drink alcohol, do not have more than 1 drink per day. One drink is considered to be 12 ounces (355 mL) of beer, 5 ounces (148 mL) of wine, or 1.5 ounces (44 mL) of liquor.  Avoid use of street drugs. Do not share needles with anyone. Ask for help if you need support or instructions about stopping the use of drugs.  High blood pressure causes heart disease and increases the risk of  stroke. Your blood pressure should be checked at least every 1 to 2 years. Ongoing high blood pressure should be treated with medicines if weight loss and exercise do not work.  If you are 75-52 years old, ask your health care provider if you should take aspirin to prevent strokes.  Diabetes screening involves taking a blood sample to check your fasting blood sugar level. This should be done once every 3 years, after age 15, if you are within normal weight and without risk factors for diabetes. Testing should be considered at a younger age or be carried out more frequently if you are overweight and have at least 1 risk factor for diabetes.  Breast cancer screening is essential preventive care for women. You should practice "breast self-awareness." This means understanding the normal appearance and feel of your breasts and may include breast self-examination. Any changes detected, no matter how small, should be reported to a health care provider. Women in their 58s and 30s should have a clinical breast exam (CBE) by a health care provider as part of a regular health exam every 1 to 3 years. After age 16, women should have a CBE every year. Starting at age 53, women should consider having a mammogram (breast X-ray test) every year. Women who have a family history of breast cancer should talk to their health care provider about genetic screening. Women at a high risk of breast cancer should talk to their health care providers about having an MRI and a mammogram every year.  Breast cancer gene (BRCA)-related cancer risk assessment is recommended for women who have family members with BRCA-related cancers. BRCA-related cancers include breast, ovarian, tubal, and peritoneal cancers. Having family members with these cancers may be associated with an increased risk for harmful changes (mutations) in the breast cancer genes BRCA1 and BRCA2. Results of the assessment will determine the need for genetic counseling and  BRCA1 and BRCA2 testing.  Routine pelvic exams to screen for cancer are no longer recommended for nonpregnant women who are considered low risk for cancer of the pelvic organs (ovaries, uterus, and vagina) and who do not have symptoms. Ask your health care provider if a screening pelvic exam is right for you.  If you have had past treatment for cervical cancer or a condition that could lead to cancer, you need Pap tests and screening for cancer for at least 20 years after your treatment. If Pap tests have been discontinued, your risk factors (such as having a new sexual partner) need to be reassessed to determine if screening should be resumed. Some women have medical problems that increase the chance of getting cervical cancer. In these cases, your health care provider may recommend more frequent screening and Pap tests.  The HPV test is an additional test that may be used for cervical cancer screening. The HPV test looks for the virus that can cause the cell changes on the cervix. The cells collected during the Pap test can be  tested for HPV. The HPV test could be used to screen women aged 30 years and older, and should be used in women of any age who have unclear Pap test results. After the age of 30, women should have HPV testing at the same frequency as a Pap test.  Colorectal cancer can be detected and often prevented. Most routine colorectal cancer screening begins at the age of 50 years and continues through age 75 years. However, your health care provider may recommend screening at an earlier age if you have risk factors for colon cancer. On a yearly basis, your health care provider may provide home test kits to check for hidden blood in the stool. Use of a small camera at the end of a tube, to directly examine the colon (sigmoidoscopy or colonoscopy), can detect the earliest forms of colorectal cancer. Talk to your health care provider about this at age 50, when routine screening begins. Direct  exam of the colon should be repeated every 5-10 years through age 75 years, unless early forms of pre-cancerous polyps or small growths are found.  People who are at an increased risk for hepatitis B should be screened for this virus. You are considered at high risk for hepatitis B if:  You were born in a country where hepatitis B occurs often. Talk with your health care provider about which countries are considered high risk.  Your parents were born in a high-risk country and you have not received a shot to protect against hepatitis B (hepatitis B vaccine).  You have HIV or AIDS.  You use needles to inject street drugs.  You live with, or have sex with, someone who has hepatitis B.  You get hemodialysis treatment.  You take certain medicines for conditions like cancer, organ transplantation, and autoimmune conditions.  Hepatitis C blood testing is recommended for all people born from 1945 through 1965 and any individual with known risks for hepatitis C.  Practice safe sex. Use condoms and avoid high-risk sexual practices to reduce the spread of sexually transmitted infections (STIs). STIs include gonorrhea, chlamydia, syphilis, trichomonas, herpes, HPV, and human immunodeficiency virus (HIV). Herpes, HIV, and HPV are viral illnesses that have no cure. They can result in disability, cancer, and death.  You should be screened for sexually transmitted illnesses (STIs) including gonorrhea and chlamydia if:  You are sexually active and are younger than 24 years.  You are older than 24 years and your health care provider tells you that you are at risk for this type of infection.  Your sexual activity has changed since you were last screened and you are at an increased risk for chlamydia or gonorrhea. Ask your health care provider if you are at risk.  If you are at risk of being infected with HIV, it is recommended that you take a prescription medicine daily to prevent HIV infection. This is  called preexposure prophylaxis (PrEP). You are considered at risk if:  You are a heterosexual woman, are sexually active, and are at increased risk for HIV infection.  You take drugs by injection.  You are sexually active with a partner who has HIV.  Talk with your health care provider about whether you are at high risk of being infected with HIV. If you choose to begin PrEP, you should first be tested for HIV. You should then be tested every 3 months for as long as you are taking PrEP.  Osteoporosis is a disease in which the bones lose minerals and strength   with aging. This can result in serious bone fractures or breaks. The risk of osteoporosis can be identified using a bone density scan. Women ages 65 years and over and women at risk for fractures or osteoporosis should discuss screening with their health care providers. Ask your health care provider whether you should take a calcium supplement or vitamin D to reduce the rate of osteoporosis.  Menopause can be associated with physical symptoms and risks. Hormone replacement therapy is available to decrease symptoms and risks. You should talk to your health care provider about whether hormone replacement therapy is right for you.  Use sunscreen. Apply sunscreen liberally and repeatedly throughout the day. You should seek shade when your shadow is shorter than you. Protect yourself by wearing long sleeves, pants, a wide-brimmed hat, and sunglasses year round, whenever you are outdoors.  Once a month, do a whole body skin exam, using a mirror to look at the skin on your back. Tell your health care provider of new moles, moles that have irregular borders, moles that are larger than a pencil eraser, or moles that have changed in shape or color.  Stay current with required vaccines (immunizations).  Influenza vaccine. All adults should be immunized every year.  Tetanus, diphtheria, and acellular pertussis (Td, Tdap) vaccine. Pregnant women should  receive 1 dose of Tdap vaccine during each pregnancy. The dose should be obtained regardless of the length of time since the last dose. Immunization is preferred during the 27th-36th week of gestation. An adult who has not previously received Tdap or who does not know her vaccine status should receive 1 dose of Tdap. This initial dose should be followed by tetanus and diphtheria toxoids (Td) booster doses every 10 years. Adults with an unknown or incomplete history of completing a 3-dose immunization series with Td-containing vaccines should begin or complete a primary immunization series including a Tdap dose. Adults should receive a Td booster every 10 years.  Varicella vaccine. An adult without evidence of immunity to varicella should receive 2 doses or a second dose if she has previously received 1 dose. Pregnant females who do not have evidence of immunity should receive the first dose after pregnancy. This first dose should be obtained before leaving the health care facility. The second dose should be obtained 4-8 weeks after the first dose.  Human papillomavirus (HPV) vaccine. Females aged 13-26 years who have not received the vaccine previously should obtain the 3-dose series. The vaccine is not recommended for use in pregnant females. However, pregnancy testing is not needed before receiving a dose. If a female is found to be pregnant after receiving a dose, no treatment is needed. In that case, the remaining doses should be delayed until after the pregnancy. Immunization is recommended for any person with an immunocompromised condition through the age of 26 years if she did not get any or all doses earlier. During the 3-dose series, the second dose should be obtained 4-8 weeks after the first dose. The third dose should be obtained 24 weeks after the first dose and 16 weeks after the second dose.  Zoster vaccine. One dose is recommended for adults aged 60 years or older unless certain conditions are  present.  Measles, mumps, and rubella (MMR) vaccine. Adults born before 1957 generally are considered immune to measles and mumps. Adults born in 1957 or later should have 1 or more doses of MMR vaccine unless there is a contraindication to the vaccine or there is laboratory evidence of immunity to   each of the three diseases. A routine second dose of MMR vaccine should be obtained at least 28 days after the first dose for students attending postsecondary schools, health care workers, or international travelers. People who received inactivated measles vaccine or an unknown type of measles vaccine during 1963-1967 should receive 2 doses of MMR vaccine. People who received inactivated mumps vaccine or an unknown type of mumps vaccine before 1979 and are at high risk for mumps infection should consider immunization with 2 doses of MMR vaccine. For females of childbearing age, rubella immunity should be determined. If there is no evidence of immunity, females who are not pregnant should be vaccinated. If there is no evidence of immunity, females who are pregnant should delay immunization until after pregnancy. Unvaccinated health care workers born before 1957 who lack laboratory evidence of measles, mumps, or rubella immunity or laboratory confirmation of disease should consider measles and mumps immunization with 2 doses of MMR vaccine or rubella immunization with 1 dose of MMR vaccine.  Pneumococcal 13-valent conjugate (PCV13) vaccine. When indicated, a person who is uncertain of her immunization history and has no record of immunization should receive the PCV13 vaccine. An adult aged 19 years or older who has certain medical conditions and has not been previously immunized should receive 1 dose of PCV13 vaccine. This PCV13 should be followed with a dose of pneumococcal polysaccharide (PPSV23) vaccine. The PPSV23 vaccine dose should be obtained at least 8 weeks after the dose of PCV13 vaccine. An adult aged 19  years or older who has certain medical conditions and previously received 1 or more doses of PPSV23 vaccine should receive 1 dose of PCV13. The PCV13 vaccine dose should be obtained 1 or more years after the last PPSV23 vaccine dose.  Pneumococcal polysaccharide (PPSV23) vaccine. When PCV13 is also indicated, PCV13 should be obtained first. All adults aged 65 years and older should be immunized. An adult younger than age 65 years who has certain medical conditions should be immunized. Any person who resides in a nursing home or long-term care facility should be immunized. An adult smoker should be immunized. People with an immunocompromised condition and certain other conditions should receive both PCV13 and PPSV23 vaccines. People with human immunodeficiency virus (HIV) infection should be immunized as soon as possible after diagnosis. Immunization during chemotherapy or radiation therapy should be avoided. Routine use of PPSV23 vaccine is not recommended for American Indians, [place] Natives, or people younger than 65 years unless there are medical conditions that require PPSV23 vaccine. When indicated, people who have unknown immunization and have no record of immunization should receive PPSV23 vaccine. One-time revaccination 5 years after the first dose of PPSV23 is recommended for people aged 19-64 years who have chronic kidney failure, nephrotic syndrome, asplenia, or immunocompromised conditions. People who received 1-2 doses of PPSV23 before age 65 years should receive another dose of PPSV23 vaccine at age 65 years or later if at least 5 years have passed since the previous dose. Doses of PPSV23 are not needed for people immunized with PPSV23 at or after age 65 years.  Meningococcal vaccine. Adults with asplenia or persistent complement component deficiencies should receive 2 doses of quadrivalent meningococcal conjugate (MenACWY-D) vaccine. The doses should be obtained at least 2 months apart.  Microbiologists working with certain meningococcal bacteria, military recruits, people at risk during an outbreak, and people who travel to or live in countries with a high rate of meningitis should be immunized. A first-year college student up through age   21 years who is living in a residence hall should receive a dose if she did not receive a dose on or after her 16th birthday. Adults who have certain high-risk conditions should receive one or more doses of vaccine.  Hepatitis A vaccine. Adults who wish to be protected from this disease, have certain high-risk conditions, work with hepatitis A-infected animals, work in hepatitis A research labs, or travel to or work in countries with a high rate of hepatitis A should be immunized. Adults who were previously unvaccinated and who anticipate close contact with an international adoptee during the first 60 days after arrival in the [place] from a country with a high rate of hepatitis A should be immunized.  Hepatitis B vaccine. Adults who wish to be protected from this disease, have certain high-risk conditions, may be exposed to blood or other infectious body fluids, are household contacts or sex partners of hepatitis B positive people, are clients or workers in certain care facilities, or travel to or work in countries with a high rate of hepatitis B should be immunized.  Haemophilus influenzae type b (Hib) vaccine. A previously unvaccinated person with asplenia or sickle cell disease or having a scheduled splenectomy should receive 1 dose of Hib vaccine. Regardless of previous immunization, a recipient of a hematopoietic stem cell transplant should receive a 3-dose series 6-12 months after her successful transplant. Hib vaccine is not recommended for adults with HIV infection. Preventive Services / Frequency Ages 64 to 68 years  Blood pressure check.** / Every 1 to 2 years.  Lipid and cholesterol check.** / Every 5 years beginning at age  22.  Clinical breast exam.** / Every 3 years for women in their 88s and 53s.  BRCA-related cancer risk assessment.** / For women who have family members with a BRCA-related cancer (breast, ovarian, tubal, or peritoneal cancers).  Pap test.** / Every 2 years from ages 90 through 51. Every 3 years starting at age 21 through age 56 or 3 with a history of 3 consecutive normal Pap tests.  HPV screening.** / Every 3 years from ages 24 through ages 1 to 46 with a history of 3 consecutive normal Pap tests.  Hepatitis C blood test.** / For any individual with known risks for hepatitis C.  Skin self-exam. / Monthly.  Influenza vaccine. / Every year.  Tetanus, diphtheria, and acellular pertussis (Tdap, Td) vaccine.** / Consult your health care provider. Pregnant women should receive 1 dose of Tdap vaccine during each pregnancy. 1 dose of Td every 10 years.  Varicella vaccine.** / Consult your health care provider. Pregnant females who do not have evidence of immunity should receive the first dose after pregnancy.  HPV vaccine. / 3 doses over 6 months, if 72 and younger. The vaccine is not recommended for use in pregnant females. However, pregnancy testing is not needed before receiving a dose.  Measles, mumps, rubella (MMR) vaccine.** / You need at least 1 dose of MMR if you were born in 1957 or later. You may also need a 2nd dose. For females of childbearing age, rubella immunity should be determined. If there is no evidence of immunity, females who are not pregnant should be vaccinated. If there is no evidence of immunity, females who are pregnant should delay immunization until after pregnancy.  Pneumococcal 13-valent conjugate (PCV13) vaccine.** / Consult your health care provider.  Pneumococcal polysaccharide (PPSV23) vaccine.** / 1 to 2 doses if you smoke cigarettes or if you have certain conditions.  Meningococcal vaccine.** /  1 dose if you are age 19 to 21 years and a first-year college  student living in a residence hall, or have one of several medical conditions, you need to get vaccinated against meningococcal disease. You may also need additional booster doses.  Hepatitis A vaccine.** / Consult your health care provider.  Hepatitis B vaccine.** / Consult your health care provider.  Haemophilus influenzae type b (Hib) vaccine.** / Consult your health care provider. Ages 40 to 64 years  Blood pressure check.** / Every 1 to 2 years.  Lipid and cholesterol check.** / Every 5 years beginning at age 20 years.  Lung cancer screening. / Every year if you are aged 55-80 years and have a 30-pack-year history of smoking and currently smoke or have quit within the past 15 years. Yearly screening is stopped once you have quit smoking for at least 15 years or develop a health problem that would prevent you from having lung cancer treatment.  Clinical breast exam.** / Every year after age 40 years.  BRCA-related cancer risk assessment.** / For women who have family members with a BRCA-related cancer (breast, ovarian, tubal, or peritoneal cancers).  Mammogram.** / Every year beginning at age 40 years and continuing for as long as you are in good health. Consult with your health care provider.  Pap test.** / Every 3 years starting at age 30 years through age 65 or 70 years with a history of 3 consecutive normal Pap tests.  HPV screening.** / Every 3 years from ages 30 years through ages 65 to 70 years with a history of 3 consecutive normal Pap tests.  Fecal occult blood test (FOBT) of stool. / Every year beginning at age 50 years and continuing until age 75 years. You may not need to do this test if you get a colonoscopy every 10 years.  Flexible sigmoidoscopy or colonoscopy.** / Every 5 years for a flexible sigmoidoscopy or every 10 years for a colonoscopy beginning at age 50 years and continuing until age 75 years.  Hepatitis C blood test.** / For all people born from 1945 through  1965 and any individual with known risks for hepatitis C.  Skin self-exam. / Monthly.  Influenza vaccine. / Every year.  Tetanus, diphtheria, and acellular pertussis (Tdap/Td) vaccine.** / Consult your health care provider. Pregnant women should receive 1 dose of Tdap vaccine during each pregnancy. 1 dose of Td every 10 years.  Varicella vaccine.** / Consult your health care provider. Pregnant females who do not have evidence of immunity should receive the first dose after pregnancy.  Zoster vaccine.** / 1 dose for adults aged 60 years or older.  Measles, mumps, rubella (MMR) vaccine.** / You need at least 1 dose of MMR if you were born in 1957 or later. You may also need a 2nd dose. For females of childbearing age, rubella immunity should be determined. If there is no evidence of immunity, females who are not pregnant should be vaccinated. If there is no evidence of immunity, females who are pregnant should delay immunization until after pregnancy.  Pneumococcal 13-valent conjugate (PCV13) vaccine.** / Consult your health care provider.  Pneumococcal polysaccharide (PPSV23) vaccine.** / 1 to 2 doses if you smoke cigarettes or if you have certain conditions.  Meningococcal vaccine.** / Consult your health care provider.  Hepatitis A vaccine.** / Consult your health care provider.  Hepatitis B vaccine.** / Consult your health care provider.  Haemophilus influenzae type b (Hib) vaccine.** / Consult your health care provider. Ages 65   years and over  Blood pressure check.** / Every 1 to 2 years.  Lipid and cholesterol check.** / Every 5 years beginning at age 22 years.  Lung cancer screening. / Every year if you are aged 73-80 years and have a 30-pack-year history of smoking and currently smoke or have quit within the past 15 years. Yearly screening is stopped once you have quit smoking for at least 15 years or develop a health problem that would prevent you from having lung cancer  treatment.  Clinical breast exam.** / Every year after age 4 years.  BRCA-related cancer risk assessment.** / For women who have family members with a BRCA-related cancer (breast, ovarian, tubal, or peritoneal cancers).  Mammogram.** / Every year beginning at age 40 years and continuing for as long as you are in good health. Consult with your health care provider.  Pap test.** / Every 3 years starting at age 9 years through age 34 or 91 years with 3 consecutive normal Pap tests. Testing can be stopped between 65 and 70 years with 3 consecutive normal Pap tests and no abnormal Pap or HPV tests in the past 10 years.  HPV screening.** / Every 3 years from ages 57 years through ages 64 or 45 years with a history of 3 consecutive normal Pap tests. Testing can be stopped between 65 and 70 years with 3 consecutive normal Pap tests and no abnormal Pap or HPV tests in the past 10 years.  Fecal occult blood test (FOBT) of stool. / Every year beginning at age 15 years and continuing until age 17 years. You may not need to do this test if you get a colonoscopy every 10 years.  Flexible sigmoidoscopy or colonoscopy.** / Every 5 years for a flexible sigmoidoscopy or every 10 years for a colonoscopy beginning at age 86 years and continuing until age 71 years.  Hepatitis C blood test.** / For all people born from [birth year] through 1963 and any individual with known risks for hepatitis C.  Osteoporosis screening.** / A one-time screening for women ages 83 years and over and women at risk for fractures or osteoporosis.  Skin self-exam. / Monthly.  Influenza vaccine. / Every year.  Tetanus, diphtheria, and acellular pertussis (Tdap/Td) vaccine.** / 1 dose of Td every 10 years.  Varicella vaccine.** / Consult your health care provider.  Zoster vaccine.** / 1 dose for adults aged 61 years or older.  Pneumococcal 13-valent conjugate (PCV13) vaccine.** / Consult your health care provider.  Pneumococcal  polysaccharide (PPSV23) vaccine.** / 1 dose for all adults aged 28 years and older.  Meningococcal vaccine.** / Consult your health care provider.  Hepatitis A vaccine.** / Consult your health care provider.  Hepatitis B vaccine.** / Consult your health care provider.  Haemophilus influenzae type b (Hib) vaccine.** / Consult your health care provider. ** Family history and personal history of risk and conditions may change your health care provider's recommendations. Document Released: 08/04/2001 Document Revised: 10/23/2013 Document Reviewed: 11/03/2010 [facility] Patient Information 2015 [place], [place]. This information is not intended to replace advice given to you by your health care provider. Make sure you discuss any questions you have with your health care provider.

## 2014-01-30 NOTE — Assessment & Plan Note (Signed)
con't with splint Consider hand surgeon if no improvement

## 2014-01-31 ENCOUNTER — Telehealth: Payer: Self-pay | Admitting: Family Medicine

## 2014-01-31 NOTE — Telephone Encounter (Signed)
Relevant patient education assigned to patient using Emmi. ° °

## 2014-03-20 DIAGNOSIS — H93299 Other abnormal auditory perceptions, unspecified ear: Secondary | ICD-10-CM | POA: Diagnosis not present

## 2014-03-26 ENCOUNTER — Other Ambulatory Visit: Payer: Self-pay

## 2014-03-26 DIAGNOSIS — I10 Essential (primary) hypertension: Secondary | ICD-10-CM

## 2014-03-26 MED ORDER — LABETALOL HCL 100 MG PO TABS: ORAL_TABLET | ORAL | Status: DC

## 2014-03-28 ENCOUNTER — Encounter: Payer: Self-pay | Admitting: Family

## 2014-03-28 ENCOUNTER — Ambulatory Visit (INDEPENDENT_AMBULATORY_CARE_PROVIDER_SITE_OTHER): Payer: Medicare Other | Admitting: Family

## 2014-03-28 ENCOUNTER — Other Ambulatory Visit: Payer: Self-pay

## 2014-03-28 VITALS — BP 140/80 | HR 92 | Temp 98.3°F | Ht 62.5 in | Wt 204.4 lb

## 2014-03-28 DIAGNOSIS — R221 Localized swelling, mass and lump, neck: Secondary | ICD-10-CM | POA: Diagnosis not present

## 2014-03-28 DIAGNOSIS — M79622 Pain in left upper arm: Secondary | ICD-10-CM

## 2014-03-28 NOTE — Progress Notes (Signed)
Pre visit review using our clinic review tool, if applicable. No additional management support is needed unless otherwise documented below in the visit note. 

## 2014-03-28 NOTE — Assessment & Plan Note (Signed)
Given previous history of non-Hodgkin lymphoma, cannot rule out as a potential cause. Will obtain a CT of the neck soft tissue with contrast. Treat conservatively until then as if musculoskeletal. Will follow up once CT is completed. Pt in agreement with treatment plan.

## 2014-03-28 NOTE — Progress Notes (Signed)
   Subjective:    Patient ID: Candice Bennett, female    DOB: 05-12-1946, 68 y.o.   MRN: [id number]  HPI:  Candice Bennett is a 68 y.o. female who presents today for a weird feeling under her left arm and breast. Indicates that about a month ago she began having an indescribable feeling on her left arm/shoulder and axilla. Also notes a weird feeling along her left clavicle. Denies any trauma to the area or numbness or tingling. Also notes about 1 week ago she started having a thick feeling in her neck. She denies feeling and lumps or masses, or difficulty swallowing and is unsure if these things are related.  Has a previous history of Non-hodgkin lymphoma which was treated at the [facility] and notes that when her lymph nodes were enlarged they were not able to be felt.  The last time she was seen at the [facility] was about 5 years ago.  Allergies  Allergen Reactions  . Iohexol Itching   Past Medical History  Diagnosis Date  . Unspecified essential hypertension   . Follicular lymphoma   . OSA (obstructive sleep apnea)   . Sarcoidosis    Current outpatient prescriptions:amLODipine (NORVASC) 5 MG tablet, TAKE 1 TAB BY MOUTH DAY, Disp: 90 tablet, Rfl: 3;  labetalol (NORMODYNE) 100 MG tablet, 1 tab by mouth twice daily, Disp: 180 tablet, Rfl: 1;  mometasone (ELOCON) 0.1 % cream, USED SPARINGLY ONCE DAILY, Disp: 15 g, Rfl: 0  Review of Systems  See HPI    Objective:     BP 140/80  Pulse 92  Temp(Src) 98.3 F (36.8 C) (Oral)  Ht 5' 2.5" (1.588 m)  Wt 204 lb 6.4 oz (92.715 kg)  BMI 36.77 kg/m2  SpO2 97% Nursing note and vital signs reviewed.  Physical Exam  Constitutional: She is oriented to person, place, and time. She appears well-developed and well-nourished. No distress.  HENT:  Head: Normocephalic.  Neck: Neck supple. No JVD present. No tracheal deviation present. No thyromegaly present.  No palpable masses appreciated  Cardiovascular: Normal rate,  regular rhythm and normal heart sounds.   Pulmonary/Chest: Effort normal and breath sounds normal.  Musculoskeletal:  No obvious deformity, discoloration or edema of left shoulder. Minor decrease in ROM compared to the right side in flexion and abduction. Muscle strength 4+ in all directions.   Neck ROM intact in flexion/extension, lateral bending and rotation.   Lymphadenopathy:    She has no cervical adenopathy.  Neurological: She is alert and oriented to person, place, and time.  Skin: Skin is warm and dry.  Psychiatric: She has a normal mood and affect. Her behavior is normal. Judgment and thought content normal.        Assessment & Plan:

## 2014-03-28 NOTE — Patient Instructions (Signed)
Thank you for choosing [facility].  Summary/Instructions:   Your CT scan order has been placed. They should be in contact with you shortly. If you have not heard anything in a week, please let us know.   Thank you for enrolling in [facility]. Please follow the instructions below to securely access your online medical record. MyChart allows you to send messages to your doctor, view your test results, renew your prescriptions, schedule appointments, and more.  How Do I Sign Up? 1. In your Internet browser, go to [email or web address] 2. Click on the New  User? link in the Sign In box.  3. Enter your MyChart Access Code exactly as it appears below. You will not need to use this code after you have completed the sign-up process. If you do not sign up before the expiration date, you must request a new code. MyChart Access Code: [email or web address] Expires: 03/31/2014 11:15 AM  4. Enter the last four digits of your Social Security Number (xxxx) and Date of Birth (mm/dd/yyyy) as indicated and click Next. You will be taken to the next sign-up page. 5. Create a MyChart ID. This will be your MyChart login ID and cannot be changed, so think of one that is secure and easy to remember. 6. Create a MyChart password. You can change your password at any time. 7. Enter your Password Reset Question and Answer and click Next. This can be used at a later time if you forget your password.  8. Select your communication preference, and if applicable enter your e-mail address. You will receive e-mail notification when new information is available in MyChart by choosing to receive e-mail notifications and filling in your e-mail. 9. Click Sign In. You can now view your medical record.   Additional Information If you have questions, you can email [email or web address] or call [phone] to talk to our [facility] staff. Remember, MyChart is NOT to be used for urgent needs. For medical emergencies,  dial 911.

## 2014-03-29 ENCOUNTER — Ambulatory Visit: Payer: Medicare Other | Admitting: Internal Medicine

## 2014-03-29 ENCOUNTER — Encounter: Payer: Self-pay | Admitting: Gastroenterology

## 2014-03-30 ENCOUNTER — Telehealth: Payer: Self-pay | Admitting: Family

## 2014-03-30 DIAGNOSIS — R221 Localized swelling, mass and lump, neck: Secondary | ICD-10-CM

## 2014-03-30 NOTE — Telephone Encounter (Signed)
Patient would like call back in regards to CT referral by [facility]

## 2014-04-02 ENCOUNTER — Other Ambulatory Visit: Payer: Medicare Other

## 2014-04-02 ENCOUNTER — Telehealth: Payer: Self-pay | Admitting: Family

## 2014-04-02 DIAGNOSIS — R221 Localized swelling, mass and lump, neck: Secondary | ICD-10-CM

## 2014-04-02 LAB — BUN: BUN: 9 mg/dL (ref 6–23)

## 2014-04-02 LAB — BUN+CREAT: BUN/Creatinine Ratio: 11.3 Ratio

## 2014-04-02 LAB — CREATININE, SERUM: Creat: 0.8 mg/dL (ref 0.50–1.10)

## 2014-04-02 MED ORDER — PREDNISONE 50 MG PO TABS: ORAL_TABLET | ORAL | Status: DC

## 2014-04-02 NOTE — Telephone Encounter (Signed)
Labs are entered and prednisone 50 mg take 1 po @ 13 hrs, 7 hrs, and 1 hr prior to study has been sent to pharmacy.  The benadryl can be picked up otc at her  Pharmacy.  Pt has been notified

## 2014-04-02 NOTE — Telephone Encounter (Signed)
Please call patient and inform her that her labs for her kidney function were all normal.

## 2014-04-02 NOTE — Telephone Encounter (Signed)
Pt is scheduled for CT on Wed 10/14. She is coming to the lab today to get BUN and creatinine. Can you place orders please? Also, patient has an allergy to contrast and needs to be pre-medicated with prednisone and benadryl. Patient is aware of the instructions. [facility] uses [facility] on [address].

## 2014-04-03 NOTE — Telephone Encounter (Signed)
Called pt to let her know kidney function labs were normal.

## 2014-04-04 ENCOUNTER — Telehealth: Payer: Self-pay | Admitting: Family

## 2014-04-04 ENCOUNTER — Ambulatory Visit (INDEPENDENT_AMBULATORY_CARE_PROVIDER_SITE_OTHER)
Admission: RE | Admit: 2014-04-04 | Discharge: 2014-04-04 | Disposition: A | Discharge status: Home / Self Care | Payer: Medicare Other | Source: Ambulatory Visit | Attending: Family | Admitting: Family

## 2014-04-04 DIAGNOSIS — R221 Localized swelling, mass and lump, neck: Secondary | ICD-10-CM | POA: Diagnosis not present

## 2014-04-04 DIAGNOSIS — R0689 Other abnormalities of breathing: Secondary | ICD-10-CM | POA: Diagnosis not present

## 2014-04-04 MED ORDER — IOHEXOL 300 MG/ML  SOLN
75.0000 mL | Freq: Once | INTRAMUSCULAR | Status: AC | PRN
  Administered 2014-04-04: 75 mL via INTRAVENOUS

## 2014-04-04 NOTE — Telephone Encounter (Signed)
Please call the patient to inform her that her CT scan results show there is no evidence of any recurrent lymphoma. 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 If her symptoms have not improved we can refer her to sports medicine or orthopedics for further evaluation if she desires.

## 2014-04-05 ENCOUNTER — Other Ambulatory Visit: Payer: Self-pay

## 2014-04-05 DIAGNOSIS — I10 Essential (primary) hypertension: Secondary | ICD-10-CM

## 2014-04-05 MED ORDER — LABETALOL HCL 100 MG PO TABS: ORAL_TABLET | ORAL | Status: DC

## 2014-04-05 NOTE — Telephone Encounter (Signed)
Called pt to give them results of CT. No answer left a message.

## 2014-04-05 NOTE — Telephone Encounter (Signed)
Pt also asked for her CT results to be mailed. I am sending them to her.

## 2014-04-05 NOTE — Telephone Encounter (Signed)
Pt left msg on triage returning phone call, called pt back, no answer.

## 2014-04-05 NOTE — Telephone Encounter (Signed)
Returned call to pt to let her know her CT had no evidence of recurrent lymphoma. I also told her if symptoms progressed we could refer her to an orthopedic specialist or sports medicine. She seemed concerned still about if things were ok with her heart and wondered why there were no lab test done to check it. She also wanted to know why kidney labs were drawn instead. She just didn't know if they had to be drawn for CT purposes or if something triggered the thought to check her kidneys. Lastly, she wanted [place] to be aware that she is planning on making a mammogram appointment due to the "funny feeling" in shoulder and under her breast area.

## 2014-04-06 ENCOUNTER — Other Ambulatory Visit: Payer: Self-pay

## 2014-04-06 DIAGNOSIS — Z1231 Encounter for screening mammogram for malignant neoplasm of breast: Secondary | ICD-10-CM

## 2014-04-25 ENCOUNTER — Ambulatory Visit
Admission: RE | Admit: 2014-04-25 | Discharge: 2014-04-25 | Disposition: A | Discharge status: Home / Self Care | Payer: Medicare Other | Source: Ambulatory Visit

## 2014-04-25 ENCOUNTER — Ambulatory Visit
Admission: RE | Admit: 2014-04-25 | Discharge: 2014-04-25 | Disposition: A | Discharge status: Home / Self Care | Payer: Medicare Other | Source: Ambulatory Visit | Attending: Family Medicine | Admitting: Family Medicine

## 2014-04-25 DIAGNOSIS — E2839 Other primary ovarian failure: Secondary | ICD-10-CM

## 2014-04-25 DIAGNOSIS — M85852 Other specified disorders of bone density and structure, left thigh: Secondary | ICD-10-CM | POA: Diagnosis not present

## 2014-04-25 DIAGNOSIS — M8588 Other specified disorders of bone density and structure, other site: Secondary | ICD-10-CM | POA: Diagnosis not present

## 2014-04-25 DIAGNOSIS — Z1231 Encounter for screening mammogram for malignant neoplasm of breast: Secondary | ICD-10-CM

## 2014-04-25 DIAGNOSIS — Z8572 Personal history of non-Hodgkin lymphomas: Secondary | ICD-10-CM | POA: Diagnosis not present

## 2014-04-25 DIAGNOSIS — Z780 Asymptomatic menopausal state: Secondary | ICD-10-CM | POA: Diagnosis not present

## 2014-05-04 ENCOUNTER — Telehealth: Payer: Self-pay | Admitting: Family Medicine

## 2014-05-04 NOTE — Telephone Encounter (Signed)
No change-- ostepenia If she is taking ca and vita d ---- add fosamax 70 mg weekly #4 11 refills--- recheck 2 years

## 2014-05-04 NOTE — Telephone Encounter (Signed)
Caller name: Elayjah, Chaney Relation to pt: self  Call back number: [phone]   Reason for call:  Pt returning your call

## 2014-05-04 NOTE — Telephone Encounter (Signed)
Discussed with patient and she voiced understanding. She said she is not taking calcium or Vitamin D and would start, she has been made aware to take 1200-1500 Mg of Calcium 1000 units of Vitamin D.      KP

## 2014-10-09 ENCOUNTER — Telehealth: Payer: Self-pay | Admitting: Family Medicine

## 2014-10-09 DIAGNOSIS — I10 Essential (primary) hypertension: Secondary | ICD-10-CM

## 2014-10-09 MED ORDER — AMLODIPINE BESYLATE 5 MG PO TABS: ORAL_TABLET | ORAL | Status: DC

## 2014-10-09 MED ORDER — LABETALOL HCL 100 MG PO TABS
ORAL_TABLET | ORAL | Status: DC
Start: 2014-10-09 — End: 2015-01-15

## 2014-10-09 MED ORDER — MOMETASONE FUROATE 0.1 % EX CREA: TOPICAL_CREAM | CUTANEOUS | Status: DC

## 2014-10-09 NOTE — Telephone Encounter (Signed)
Caller name: Marionna Relation to pt: self Call back number: [phone] Pharmacy:  Reason for call:   Switching pharmacies. New pharmacy is [facility] on [address]. Needs refills of amlodipine and labetalol. She also uses a cream for a rash, mometasone that needs a refillp

## 2014-10-09 NOTE — Telephone Encounter (Signed)
Medication has been faxed.       KP

## 2015-01-15 ENCOUNTER — Other Ambulatory Visit: Payer: Self-pay | Admitting: Family Medicine

## 2015-01-29 ENCOUNTER — Ambulatory Visit (INDEPENDENT_AMBULATORY_CARE_PROVIDER_SITE_OTHER): Payer: Medicare Other | Admitting: Family Medicine

## 2015-01-29 ENCOUNTER — Encounter: Payer: Self-pay | Admitting: Family Medicine

## 2015-01-29 VITALS — BP 124/74 | HR 90 | Temp 98.0°F | Ht 63.0 in | Wt 195.0 lb

## 2015-01-29 DIAGNOSIS — L309 Dermatitis, unspecified: Secondary | ICD-10-CM | POA: Diagnosis not present

## 2015-01-29 DIAGNOSIS — B372 Candidiasis of skin and nail: Secondary | ICD-10-CM | POA: Diagnosis not present

## 2015-01-29 MED ORDER — NYSTATIN 100000 UNIT/GM EX POWD: CUTANEOUS | Status: DC

## 2015-01-29 MED ORDER — MOMETASONE FUROATE 0.1 % EX CREA: TOPICAL_CREAM | CUTANEOUS | Status: DC

## 2015-01-29 NOTE — Progress Notes (Signed)
Patient ID: [name], female    DOB: 02/23/1946  Age: 69 y.o. MRN: [id number]    Subjective:  Subjective HPI Candice Bennett presents with c/o rash on low abd under folds of skin.  She has been using abx ointment and acortisone cream with no relief.  Symptoms x 2 weeks.  Rash is along her old C section scar.   Review of Systems  Constitutional: Negative for diaphoresis, appetite change, fatigue and unexpected weight change.  Eyes: Negative for pain, redness and visual disturbance.  Respiratory: Negative for cough, chest tightness, shortness of breath and wheezing.   Cardiovascular: Negative for chest pain, palpitations and leg swelling.  Endocrine: Negative for cold intolerance, heat intolerance, polydipsia, polyphagia and polyuria.  Genitourinary: Negative for dysuria, frequency and difficulty urinating.  Skin: Positive for rash.  Neurological: Negative for dizziness, light-headedness, numbness and headaches.    History Past Medical History  Diagnosis Date  . Unspecified essential hypertension   . Follicular lymphoma   . OSA (obstructive sleep apnea)   . Sarcoidosis     She has past surgical history that includes Carpal tunnel release; Tubal ligation (1978); Lymph node biopsy; Abdominal hysterectomy (1983); port a cath insertion (2005); and Port-a-cath removal (2006).   Her family history includes Diabetes in her brother; Heart disease in her mother; Heart disease (age of onset: 28) in her sister; Hypertension in her sister; Lung cancer in her father. There is no history of Colon cancer.She reports that she has never smoked. She has never used smokeless tobacco. She reports that she does not drink alcohol or use illicit drugs.  Current Outpatient Prescriptions on File Prior to Visit  Medication Sig Dispense Refill  . amLODipine (NORVASC) 5 MG tablet TAKE 1 TAB BY MOUTH DAY 90 tablet 0  . labetalol (NORMODYNE) 100 MG tablet Take 1 tablet (100 mg total) by mouth 2 (two)  times daily. Office visit due now 60 tablet 0   No current facility-administered medications on file prior to visit.     Objective:  Objective Physical Exam  Constitutional: She is oriented to person, place, and time. She appears well-developed and well-nourished.  HENT:  Head: Normocephalic and atraumatic.  Eyes: Conjunctivae and EOM are normal.  Neck: Normal range of motion. Neck supple. No JVD present. Carotid bruit is not present. No thyromegaly present.  Cardiovascular: Normal rate, regular rhythm and normal heart sounds.   No murmur heard. Pulmonary/Chest: Effort normal and breath sounds normal. No respiratory distress. She has no wheezes. She has no rales. She exhibits no tenderness.  Musculoskeletal: She exhibits no edema.  Neurological: She is alert and oriented to person, place, and time.  Skin: Rash noted. There is erythema.     Psychiatric: She has a normal mood and affect. Her behavior is normal. Judgment and thought content normal.   BP 124/74 mmHg  Pulse 90  Temp(Src) 98 F (36.7 C) (Oral)  Ht 5\' 3"  (1.6 m)  Wt 195 lb (88.451 kg)  BMI 34.55 kg/m2  SpO2 98% Wt Readings from Last 3 Encounters:  01/29/15 195 lb (88.451 kg)  03/28/14 204 lb 6.4 oz (92.715 kg)  01/30/14 200 lb 3.2 oz (90.81 kg)     Lab Results  Component Value Date   WBC 6.1 01/30/2014   HGB 12.1 01/30/2014   HCT 38.8 01/30/2014   PLT 349.0 01/30/2014   GLUCOSE 95 01/30/2014   CHOL 194 01/30/2014   TRIG 90.0 01/30/2014   HDL 62.70 01/30/2014   LDLCALC 113*  01/30/2014   ALT 19 01/30/2014   AST 18 01/30/2014   NA 138 01/30/2014   K 3.9 01/30/2014   CL 103 01/30/2014   CREATININE 0.80 04/02/2014   BUN 9 04/02/2014   CO2 26 01/30/2014   TSH 0.86 04/07/2011    Dg Bone Density  04/27/2014   CLINICAL DATA:  69 year old postmenopausal black female with history of non-Hodgkin's lymphoma in 2005 post chemotherapy/prednisone, hypertension, and prior hysterectomy at age 17. Follow-up exam.   EXAM: DUAL X-RAY ABSORPTIOMETRY (DXA) FOR BONE MINERAL DENSITY  FINDINGS: AP LUMBAR SPINE  Bone Mineral Density (BMD):  0.929 g/cm2  Young Adult T-Score:  -1.1  Z-Score:  0.1  LEFT FEMUR NECK  Bone Mineral Density (BMD):  0.714 g/cm2  Young Adult T-Score: -1.2  Z-Score:  -0.3  ASSESSMENT: Patient's diagnostic category is LOW BONE MASS by WHO Criteria.  FRACTURE RISK: Increased  FRAX: Based on the [place] model, the 10 year probability of a major osteoporotic fracture is 3.5%. The 10 year probability of a hip fracture is 0.3%.  COMPARISON: Since a prior exam dated 04/21/2011 there has been no significant interval change in bone mineral density of the lumbar spine and no significant interval change in bone mineral density of the left hip.  Effective therapies are available in the form of bisphosphonates, selective estrogen receptor modulators, biologic agents, and hormone replacement therapy (for women). All patients should ensure an adequate intake of dietary calcium (1200 mg daily) and vitamin D (800 IU daily) unless contraindicated.  All treatment decisions require clinical judgment and consideration of individual patient factors, including patient preferences, co-morbidities, previous drug use, risk factors not captured in the FRAX model (e.g., frailty, falls, vitamin D deficiency, increased bone turnover, interval significant decline in bone density) and possible under- or over-estimation of fracture risk by FRAX.  The National Osteoporosis Foundation recommends that FDA-approved medical therapies be considered in postmenopausal women and men age 79 or older with a:  1. Hip or vertebral (clinical or morphometric) fracture.  2. T-score of -2.5 or lower at the spine or hip.  3. Ten-year fracture probability by FRAX of 3% or greater for hip fracture or 20% or greater for major osteoporotic fracture.  People with diagnosed cases of osteoporosis or at high risk for fracture should have regular  bone mineral density tests. For patients eligible for Medicare, routine testing is allowed once every 2 years. The testing frequency can be increased to one year for patients who have rapidly progressing disease, those who are receiving or discontinuing medical therapy to restore bone mass, or have additional risk factors.  World Pharmacologist [facility]) Criteria:  Normal: T-scores from +1.0 to -1.0  Low Bone Mass (Osteopenia): T-scores between -1.0 and -2.5  Osteoporosis: T-scores -2.5 and below  Comparison to Reference Population:  T-score is the key measure used in the diagnosis of osteoporosis and relative risk determination for fracture. It provides a value for bone mass relative to the mean bone mass of a young adult reference population expressed in terms of standard deviation (SD).  Z-score is the age-matched score showing the patient's values compared to a population matched for age, sex, and race. This is also expressed in terms of standard deviation. The patient may have values that compare favorably to the age-matched values and still be at increased risk for fracture.   Electronically Signed   By: Everlean Alstrom M.D.   On: 04/27/2014 14:07   Mm Screening Breast Tomo Bilateral  04/26/2014  CLINICAL DATA:  Screening.  EXAM: DIGITAL SCREENING BILATERAL MAMMOGRAM WITH 3D TOMO WITH CAD  COMPARISON:  Previous exam(s).  ACR Breast Density Category c: The breast tissue is heterogeneously dense, which may obscure small masses.  FINDINGS: There are no findings suspicious for malignancy. Images were processed with CAD.  IMPRESSION: No mammographic evidence of malignancy. A result letter of this screening mammogram will be mailed directly to the patient.  RECOMMENDATION: Screening mammogram in one year. (Code:SM-B-01Y)  BI-RADS CATEGORY  1: Negative.   Electronically Signed   By: Shon Hale M.D.   On: 04/26/2014 11:28     Assessment & Plan:  Plan I have discontinued Candice Bennett's predniSONE. I am also  having her start on nystatin. Additionally, I am having her maintain her amLODipine, labetalol, and mometasone.  Meds ordered this encounter  Medications  . nystatin (MYCOSTATIN/NYSTOP) 100000 UNIT/GM POWD    Sig: Apply to affected area tid    Dispense:  60 g    Refill:  2  . mometasone (ELOCON) 0.1 % cream    Sig: USED SPARINGLY ONCE DAILY    Dispense:  45 g    Refill:  1    Problem List Items Addressed This Visit    None    Visit Diagnoses    Cutaneous candidiasis    -  Primary    Relevant Medications    nystatin (MYCOSTATIN/NYSTOP) 100000 UNIT/GM POWD    Eczema        Relevant Medications    mometasone (ELOCON) 0.1 % cream       Follow-up: Return if symptoms worsen or fail to improve.  Candice Koyanagi, DO

## 2015-01-29 NOTE — Patient Instructions (Signed)

## 2015-01-29 NOTE — Progress Notes (Signed)
Pre visit review using our clinic review tool, if applicable. No additional management support is needed unless otherwise documented below in the visit note. 

## 2015-01-31 ENCOUNTER — Telehealth: Payer: Self-pay | Admitting: Family Medicine

## 2015-01-31 NOTE — Telephone Encounter (Signed)
Spoke with patient re: Preview. Patient has appointment tomorrow.

## 2015-01-31 NOTE — Telephone Encounter (Signed)
°  Relation to XH:[id number] Call back number:[phone] Pharmacy:  Reason for call: pt returning your call please call back

## 2015-02-01 ENCOUNTER — Ambulatory Visit (INDEPENDENT_AMBULATORY_CARE_PROVIDER_SITE_OTHER): Payer: Medicare Other | Admitting: Family Medicine

## 2015-02-01 ENCOUNTER — Encounter: Payer: Self-pay | Admitting: Family Medicine

## 2015-02-01 VITALS — BP 108/74 | HR 73 | Temp 98.3°F | Ht 63.0 in | Wt 194.6 lb

## 2015-02-01 DIAGNOSIS — L309 Dermatitis, unspecified: Secondary | ICD-10-CM

## 2015-02-01 DIAGNOSIS — E538 Deficiency of other specified B group vitamins: Secondary | ICD-10-CM | POA: Diagnosis not present

## 2015-02-01 DIAGNOSIS — I10 Essential (primary) hypertension: Secondary | ICD-10-CM | POA: Diagnosis not present

## 2015-02-01 DIAGNOSIS — Z0000 Encounter for general adult medical examination without abnormal findings: Secondary | ICD-10-CM | POA: Diagnosis not present

## 2015-02-01 LAB — POCT URINALYSIS DIPSTICK
Bilirubin, UA: NEGATIVE
Glucose, UA: NEGATIVE
Ketones, UA: NEGATIVE
Leukocytes, UA: NEGATIVE
Nitrite, UA: NEGATIVE
PH UA: 6
PROTEIN UA: NEGATIVE
RBC UA: NEGATIVE
Spec Grav, UA: >=1.03
UROBILINOGEN UA: 2

## 2015-02-01 LAB — COMPREHENSIVE METABOLIC PANEL
ALK PHOS: 68 U/L (ref 39–117)
ALT: 13 U/L (ref 0–35)
AST: 18 U/L (ref 0–37)
Albumin: 4.3 g/dL (ref 3.5–5.2)
BUN: 12 mg/dL (ref 6–23)
CALCIUM: 9.4 mg/dL (ref 8.4–10.5)
CO2: 29 mEq/L (ref 19–32)
Chloride: 104 mEq/L (ref 96–112)
Creatinine, Ser: 0.81 mg/dL (ref 0.40–1.20)
GFR: 90.57 mL/min (ref >60.00)
Glucose, Bld: 95 mg/dL (ref 70–99)
Potassium: 3.5 mEq/L (ref 3.5–5.1)
Sodium: 139 mEq/L (ref 135–145)
Total Bilirubin: 0.7 mg/dL (ref 0.2–1.2)
Total Protein: 7.7 g/dL (ref 6.0–8.3)

## 2015-02-01 LAB — LIPID PANEL
Cholesterol: 177 mg/dL (ref 0–200)
HDL: 56.9 mg/dL (ref >39.00)
LDL Cholesterol: 104 mg/dL — ABNORMAL HIGH (ref 0–99)
NONHDL: 120.23
Total CHOL/HDL Ratio: 3
Triglycerides: 81 mg/dL (ref 0.0–149.0)
VLDL: 16.2 mg/dL (ref 0.0–40.0)

## 2015-02-01 LAB — CBC
HCT: 37.7 % (ref 36.0–46.0)
Hemoglobin: 12 g/dL (ref 12.0–15.0)
MCHC: 31.9 g/dL (ref 30.0–36.0)
MCV: 81.2 fl (ref 78.0–100.0)
PLATELETS: 355 10*3/uL (ref 150.0–400.0)
RBC: 4.65 Mil/uL (ref 3.87–5.11)
RDW: 15.1 % (ref 11.5–15.5)
WBC: 7.4 10*3/uL (ref 4.0–10.5)

## 2015-02-01 LAB — HEPATITIS C ANTIBODY: HCV Ab: NEGATIVE

## 2015-02-01 LAB — VITAMIN B12: Vitamin B-12: 309 pg/mL (ref 211–911)

## 2015-02-01 NOTE — Patient Instructions (Addendum)
Preventive Care for Adults A healthy lifestyle and preventive care can promote health and wellness. Preventive health guidelines for women include the following key practices.  A routine yearly physical is a good way to check with your health care provider about your health and preventive screening. It is a chance to share any concerns and updates on your health and to receive a thorough exam.  Visit your dentist for a routine exam and preventive care every 6 months. Brush your teeth twice a day and floss once a day. Good oral hygiene prevents tooth decay and gum disease.  The frequency of eye exams is based on your age, health, family medical history, use of contact lenses, and other factors. Follow your health care provider's recommendations for frequency of eye exams.  Eat a healthy diet. Foods like vegetables, fruits, whole grains, low-fat dairy products, and lean protein foods contain the nutrients you need without too many calories. Decrease your intake of foods high in solid fats, added sugars, and salt. Eat the right amount of calories for you.Get information about a proper diet from your health care provider, if necessary.  Regular physical exercise is one of the most important things you can do for your health. Most adults should get at least 150 minutes of moderate-intensity exercise (any activity that increases your heart rate and causes you to sweat) each week. In addition, most adults need muscle-strengthening exercises on 2 or more days a week.  Maintain a healthy weight. The body mass index (BMI) is a screening tool to identify possible weight problems. It provides an estimate of body fat based on height and weight. Your health care provider can find your BMI and can help you achieve or maintain a healthy weight.For adults 20 years and older:  A BMI below 18.5 is considered underweight.  A BMI of 18.5 to 24.9 is normal.  A BMI of 25 to 29.9 is considered overweight.  A BMI of  30 and above is considered obese.  Maintain normal blood lipids and cholesterol levels by exercising and minimizing your intake of saturated fat. Eat a balanced diet with plenty of fruit and vegetables. Blood tests for lipids and cholesterol should begin at age 20 and be repeated every 5 years. If your lipid or cholesterol levels are high, you are over 50, or you are at high risk for heart disease, you may need your cholesterol levels checked more frequently.Ongoing high lipid and cholesterol levels should be treated with medicines if diet and exercise are not working.  If you smoke, find out from your health care provider how to quit. If you do not use tobacco, do not start.  Lung cancer screening is recommended for adults aged 55-80 years who are at high risk for developing lung cancer because of a history of smoking. A yearly low-dose CT scan of the lungs is recommended for people who have at least a 30-pack-year history of smoking and are a current smoker or have quit within the past 15 years. A pack year of smoking is smoking an average of 1 pack of cigarettes a day for 1 year (for example: 1 pack a day for 30 years or 2 packs a day for 15 years). Yearly screening should continue until the smoker has stopped smoking for at least 15 years. Yearly screening should be stopped for people who develop a health problem that would prevent them from having lung cancer treatment.  If you are pregnant, do not drink alcohol. If you are breastfeeding,   be very cautious about drinking alcohol. If you are not pregnant and choose to drink alcohol, do not have more than 1 drink per day. One drink is considered to be 12 ounces (355 mL) of beer, 5 ounces (148 mL) of wine, or 1.5 ounces (44 mL) of liquor.  Avoid use of street drugs. Do not share needles with anyone. Ask for help if you need support or instructions about stopping the use of drugs.  High blood pressure causes heart disease and increases the risk of  stroke. Your blood pressure should be checked at least every 1 to 2 years. Ongoing high blood pressure should be treated with medicines if weight loss and exercise do not work.  If you are 75-52 years old, ask your health care provider if you should take aspirin to prevent strokes.  Diabetes screening involves taking a blood sample to check your fasting blood sugar level. This should be done once every 3 years, after age 15, if you are within normal weight and without risk factors for diabetes. Testing should be considered at a younger age or be carried out more frequently if you are overweight and have at least 1 risk factor for diabetes.  Breast cancer screening is essential preventive care for women. You should practice "breast self-awareness." This means understanding the normal appearance and feel of your breasts and may include breast self-examination. Any changes detected, no matter how small, should be reported to a health care provider. Women in their 58s and 30s should have a clinical breast exam (CBE) by a health care provider as part of a regular health exam every 1 to 3 years. After age 16, women should have a CBE every year. Starting at age 53, women should consider having a mammogram (breast X-ray test) every year. Women who have a family history of breast cancer should talk to their health care provider about genetic screening. Women at a high risk of breast cancer should talk to their health care providers about having an MRI and a mammogram every year.  Breast cancer gene (BRCA)-related cancer risk assessment is recommended for women who have family members with BRCA-related cancers. BRCA-related cancers include breast, ovarian, tubal, and peritoneal cancers. Having family members with these cancers may be associated with an increased risk for harmful changes (mutations) in the breast cancer genes BRCA1 and BRCA2. Results of the assessment will determine the need for genetic counseling and  BRCA1 and BRCA2 testing.  Routine pelvic exams to screen for cancer are no longer recommended for nonpregnant women who are considered low risk for cancer of the pelvic organs (ovaries, uterus, and vagina) and who do not have symptoms. Ask your health care provider if a screening pelvic exam is right for you.  If you have had past treatment for cervical cancer or a condition that could lead to cancer, you need Pap tests and screening for cancer for at least 20 years after your treatment. If Pap tests have been discontinued, your risk factors (such as having a new sexual partner) need to be reassessed to determine if screening should be resumed. Some women have medical problems that increase the chance of getting cervical cancer. In these cases, your health care provider may recommend more frequent screening and Pap tests.  The HPV test is an additional test that may be used for cervical cancer screening. The HPV test looks for the virus that can cause the cell changes on the cervix. The cells collected during the Pap test can be  tested for HPV. The HPV test could be used to screen women aged 30 years and older, and should be used in women of any age who have unclear Pap test results. After the age of 30, women should have HPV testing at the same frequency as a Pap test.  Colorectal cancer can be detected and often prevented. Most routine colorectal cancer screening begins at the age of 50 years and continues through age 75 years. However, your health care provider may recommend screening at an earlier age if you have risk factors for colon cancer. On a yearly basis, your health care provider may provide home test kits to check for hidden blood in the stool. Use of a small camera at the end of a tube, to directly examine the colon (sigmoidoscopy or colonoscopy), can detect the earliest forms of colorectal cancer. Talk to your health care provider about this at age 50, when routine screening begins. Direct  exam of the colon should be repeated every 5-10 years through age 75 years, unless early forms of pre-cancerous polyps or small growths are found.  People who are at an increased risk for hepatitis B should be screened for this virus. You are considered at high risk for hepatitis B if:  You were born in a country where hepatitis B occurs often. Talk with your health care provider about which countries are considered high risk.  Your parents were born in a high-risk country and you have not received a shot to protect against hepatitis B (hepatitis B vaccine).  You have HIV or AIDS.  You use needles to inject street drugs.  You live with, or have sex with, someone who has hepatitis B.  You get hemodialysis treatment.  You take certain medicines for conditions like cancer, organ transplantation, and autoimmune conditions.  Hepatitis C blood testing is recommended for all people born from 1945 through 1965 and any individual with known risks for hepatitis C.  Practice safe sex. Use condoms and avoid high-risk sexual practices to reduce the spread of sexually transmitted infections (STIs). STIs include gonorrhea, chlamydia, syphilis, trichomonas, herpes, HPV, and human immunodeficiency virus (HIV). Herpes, HIV, and HPV are viral illnesses that have no cure. They can result in disability, cancer, and death.  You should be screened for sexually transmitted illnesses (STIs) including gonorrhea and chlamydia if:  You are sexually active and are younger than 24 years.  You are older than 24 years and your health care provider tells you that you are at risk for this type of infection.  Your sexual activity has changed since you were last screened and you are at an increased risk for chlamydia or gonorrhea. Ask your health care provider if you are at risk.  If you are at risk of being infected with HIV, it is recommended that you take a prescription medicine daily to prevent HIV infection. This is  called preexposure prophylaxis (PrEP). You are considered at risk if:  You are a heterosexual woman, are sexually active, and are at increased risk for HIV infection.  You take drugs by injection.  You are sexually active with a partner who has HIV.  Talk with your health care provider about whether you are at high risk of being infected with HIV. If you choose to begin PrEP, you should first be tested for HIV. You should then be tested every 3 months for as long as you are taking PrEP.  Osteoporosis is a disease in which the bones lose minerals and strength   with aging. This can result in serious bone fractures or breaks. The risk of osteoporosis can be identified using a bone density scan. Women ages 65 years and over and women at risk for fractures or osteoporosis should discuss screening with their health care providers. Ask your health care provider whether you should take a calcium supplement or vitamin D to reduce the rate of osteoporosis.  Menopause can be associated with physical symptoms and risks. Hormone replacement therapy is available to decrease symptoms and risks. You should talk to your health care provider about whether hormone replacement therapy is right for you.  Use sunscreen. Apply sunscreen liberally and repeatedly throughout the day. You should seek shade when your shadow is shorter than you. Protect yourself by wearing long sleeves, pants, a wide-brimmed hat, and sunglasses year round, whenever you are outdoors.  Once a month, do a whole body skin exam, using a mirror to look at the skin on your back. Tell your health care provider of new moles, moles that have irregular borders, moles that are larger than a pencil eraser, or moles that have changed in shape or color.  Stay current with required vaccines (immunizations).  Influenza vaccine. All adults should be immunized every year.  Tetanus, diphtheria, and acellular pertussis (Td, Tdap) vaccine. Pregnant women should  receive 1 dose of Tdap vaccine during each pregnancy. The dose should be obtained regardless of the length of time since the last dose. Immunization is preferred during the 27th-36th week of gestation. An adult who has not previously received Tdap or who does not know her vaccine status should receive 1 dose of Tdap. This initial dose should be followed by tetanus and diphtheria toxoids (Td) booster doses every 10 years. Adults with an unknown or incomplete history of completing a 3-dose immunization series with Td-containing vaccines should begin or complete a primary immunization series including a Tdap dose. Adults should receive a Td booster every 10 years.  Varicella vaccine. An adult without evidence of immunity to varicella should receive 2 doses or a second dose if she has previously received 1 dose. Pregnant females who do not have evidence of immunity should receive the first dose after pregnancy. This first dose should be obtained before leaving the health care facility. The second dose should be obtained 4-8 weeks after the first dose.  Human papillomavirus (HPV) vaccine. Females aged 13-26 years who have not received the vaccine previously should obtain the 3-dose series. The vaccine is not recommended for use in pregnant females. However, pregnancy testing is not needed before receiving a dose. If a female is found to be pregnant after receiving a dose, no treatment is needed. In that case, the remaining doses should be delayed until after the pregnancy. Immunization is recommended for any person with an immunocompromised condition through the age of 26 years if she did not get any or all doses earlier. During the 3-dose series, the second dose should be obtained 4-8 weeks after the first dose. The third dose should be obtained 24 weeks after the first dose and 16 weeks after the second dose.  Zoster vaccine. One dose is recommended for adults aged 60 years or older unless certain conditions are  present.  Measles, mumps, and rubella (MMR) vaccine. Adults born before 1957 generally are considered immune to measles and mumps. Adults born in 1957 or later should have 1 or more doses of MMR vaccine unless there is a contraindication to the vaccine or there is laboratory evidence of immunity to   each of the three diseases. A routine second dose of MMR vaccine should be obtained at least 28 days after the first dose for students attending postsecondary schools, health care workers, or international travelers. People who received inactivated measles vaccine or an unknown type of measles vaccine during 1963-1967 should receive 2 doses of MMR vaccine. People who received inactivated mumps vaccine or an unknown type of mumps vaccine before 1979 and are at high risk for mumps infection should consider immunization with 2 doses of MMR vaccine. For females of childbearing age, rubella immunity should be determined. If there is no evidence of immunity, females who are not pregnant should be vaccinated. If there is no evidence of immunity, females who are pregnant should delay immunization until after pregnancy. Unvaccinated health care workers born before 1957 who lack laboratory evidence of measles, mumps, or rubella immunity or laboratory confirmation of disease should consider measles and mumps immunization with 2 doses of MMR vaccine or rubella immunization with 1 dose of MMR vaccine.  Pneumococcal 13-valent conjugate (PCV13) vaccine. When indicated, a person who is uncertain of her immunization history and has no record of immunization should receive the PCV13 vaccine. An adult aged 19 years or older who has certain medical conditions and has not been previously immunized should receive 1 dose of PCV13 vaccine. This PCV13 should be followed with a dose of pneumococcal polysaccharide (PPSV23) vaccine. The PPSV23 vaccine dose should be obtained at least 8 weeks after the dose of PCV13 vaccine. An adult aged 19  years or older who has certain medical conditions and previously received 1 or more doses of PPSV23 vaccine should receive 1 dose of PCV13. The PCV13 vaccine dose should be obtained 1 or more years after the last PPSV23 vaccine dose.  Pneumococcal polysaccharide (PPSV23) vaccine. When PCV13 is also indicated, PCV13 should be obtained first. All adults aged 65 years and older should be immunized. An adult younger than age 65 years who has certain medical conditions should be immunized. Any person who resides in a nursing home or long-term care facility should be immunized. An adult smoker should be immunized. People with an immunocompromised condition and certain other conditions should receive both PCV13 and PPSV23 vaccines. People with human immunodeficiency virus (HIV) infection should be immunized as soon as possible after diagnosis. Immunization during chemotherapy or radiation therapy should be avoided. Routine use of PPSV23 vaccine is not recommended for American Indians, [place] Natives, or people younger than 65 years unless there are medical conditions that require PPSV23 vaccine. When indicated, people who have unknown immunization and have no record of immunization should receive PPSV23 vaccine. One-time revaccination 5 years after the first dose of PPSV23 is recommended for people aged 19-64 years who have chronic kidney failure, nephrotic syndrome, asplenia, or immunocompromised conditions. People who received 1-2 doses of PPSV23 before age 65 years should receive another dose of PPSV23 vaccine at age 65 years or later if at least 5 years have passed since the previous dose. Doses of PPSV23 are not needed for people immunized with PPSV23 at or after age 65 years.  Meningococcal vaccine. Adults with asplenia or persistent complement component deficiencies should receive 2 doses of quadrivalent meningococcal conjugate (MenACWY-D) vaccine. The doses should be obtained at least 2 months apart.  Microbiologists working with certain meningococcal bacteria, military recruits, people at risk during an outbreak, and people who travel to or live in countries with a high rate of meningitis should be immunized. A first-year college student up through age   21 years who is living in a residence hall should receive a dose if she did not receive a dose on or after her 16th birthday. Adults who have certain high-risk conditions should receive one or more doses of vaccine.  Hepatitis A vaccine. Adults who wish to be protected from this disease, have certain high-risk conditions, work with hepatitis A-infected animals, work in hepatitis A research labs, or travel to or work in countries with a high rate of hepatitis A should be immunized. Adults who were previously unvaccinated and who anticipate close contact with an international adoptee during the first 60 days after arrival in the [place] from a country with a high rate of hepatitis A should be immunized.  Hepatitis B vaccine. Adults who wish to be protected from this disease, have certain high-risk conditions, may be exposed to blood or other infectious body fluids, are household contacts or sex partners of hepatitis B positive people, are clients or workers in certain care facilities, or travel to or work in countries with a high rate of hepatitis B should be immunized.  Haemophilus influenzae type b (Hib) vaccine. A previously unvaccinated person with asplenia or sickle cell disease or having a scheduled splenectomy should receive 1 dose of Hib vaccine. Regardless of previous immunization, a recipient of a hematopoietic stem cell transplant should receive a 3-dose series 6-12 months after her successful transplant. Hib vaccine is not recommended for adults with HIV infection. Preventive Services / Frequency Ages 64 to 68 years  Blood pressure check.** / Every 1 to 2 years.  Lipid and cholesterol check.** / Every 5 years beginning at age  22.  Clinical breast exam.** / Every 3 years for women in their 88s and 53s.  BRCA-related cancer risk assessment.** / For women who have family members with a BRCA-related cancer (breast, ovarian, tubal, or peritoneal cancers).  Pap test.** / Every 2 years from ages 90 through 51. Every 3 years starting at age 21 through age 56 or 3 with a history of 3 consecutive normal Pap tests.  HPV screening.** / Every 3 years from ages 24 through ages 1 to 46 with a history of 3 consecutive normal Pap tests.  Hepatitis C blood test.** / For any individual with known risks for hepatitis C.  Skin self-exam. / Monthly.  Influenza vaccine. / Every year.  Tetanus, diphtheria, and acellular pertussis (Tdap, Td) vaccine.** / Consult your health care provider. Pregnant women should receive 1 dose of Tdap vaccine during each pregnancy. 1 dose of Td every 10 years.  Varicella vaccine.** / Consult your health care provider. Pregnant females who do not have evidence of immunity should receive the first dose after pregnancy.  HPV vaccine. / 3 doses over 6 months, if 72 and younger. The vaccine is not recommended for use in pregnant females. However, pregnancy testing is not needed before receiving a dose.  Measles, mumps, rubella (MMR) vaccine.** / You need at least 1 dose of MMR if you were born in 1957 or later. You may also need a 2nd dose. For females of childbearing age, rubella immunity should be determined. If there is no evidence of immunity, females who are not pregnant should be vaccinated. If there is no evidence of immunity, females who are pregnant should delay immunization until after pregnancy.  Pneumococcal 13-valent conjugate (PCV13) vaccine.** / Consult your health care provider.  Pneumococcal polysaccharide (PPSV23) vaccine.** / 1 to 2 doses if you smoke cigarettes or if you have certain conditions.  Meningococcal vaccine.** /  1 dose if you are age 19 to 21 years and a first-year college  student living in a residence hall, or have one of several medical conditions, you need to get vaccinated against meningococcal disease. You may also need additional booster doses.  Hepatitis A vaccine.** / Consult your health care provider.  Hepatitis B vaccine.** / Consult your health care provider.  Haemophilus influenzae type b (Hib) vaccine.** / Consult your health care provider. Ages 40 to 64 years  Blood pressure check.** / Every 1 to 2 years.  Lipid and cholesterol check.** / Every 5 years beginning at age 20 years.  Lung cancer screening. / Every year if you are aged 55-80 years and have a 30-pack-year history of smoking and currently smoke or have quit within the past 15 years. Yearly screening is stopped once you have quit smoking for at least 15 years or develop a health problem that would prevent you from having lung cancer treatment.  Clinical breast exam.** / Every year after age 40 years.  BRCA-related cancer risk assessment.** / For women who have family members with a BRCA-related cancer (breast, ovarian, tubal, or peritoneal cancers).  Mammogram.** / Every year beginning at age 40 years and continuing for as long as you are in good health. Consult with your health care provider.  Pap test.** / Every 3 years starting at age 30 years through age 65 or 70 years with a history of 3 consecutive normal Pap tests.  HPV screening.** / Every 3 years from ages 30 years through ages 65 to 70 years with a history of 3 consecutive normal Pap tests.  Fecal occult blood test (FOBT) of stool. / Every year beginning at age 50 years and continuing until age 75 years. You may not need to do this test if you get a colonoscopy every 10 years.  Flexible sigmoidoscopy or colonoscopy.** / Every 5 years for a flexible sigmoidoscopy or every 10 years for a colonoscopy beginning at age 50 years and continuing until age 75 years.  Hepatitis C blood test.** / For all people born from 1945 through  1965 and any individual with known risks for hepatitis C.  Skin self-exam. / Monthly.  Influenza vaccine. / Every year.  Tetanus, diphtheria, and acellular pertussis (Tdap/Td) vaccine.** / Consult your health care provider. Pregnant women should receive 1 dose of Tdap vaccine during each pregnancy. 1 dose of Td every 10 years.  Varicella vaccine.** / Consult your health care provider. Pregnant females who do not have evidence of immunity should receive the first dose after pregnancy.  Zoster vaccine.** / 1 dose for adults aged 60 years or older.  Measles, mumps, rubella (MMR) vaccine.** / You need at least 1 dose of MMR if you were born in 1957 or later. You may also need a 2nd dose. For females of childbearing age, rubella immunity should be determined. If there is no evidence of immunity, females who are not pregnant should be vaccinated. If there is no evidence of immunity, females who are pregnant should delay immunization until after pregnancy.  Pneumococcal 13-valent conjugate (PCV13) vaccine.** / Consult your health care provider.  Pneumococcal polysaccharide (PPSV23) vaccine.** / 1 to 2 doses if you smoke cigarettes or if you have certain conditions.  Meningococcal vaccine.** / Consult your health care provider.  Hepatitis A vaccine.** / Consult your health care provider.  Hepatitis B vaccine.** / Consult your health care provider.  Haemophilus influenzae type b (Hib) vaccine.** / Consult your health care provider. Ages 65   years and over  Blood pressure check.** / Every 1 to 2 years.  Lipid and cholesterol check.** / Every 5 years beginning at age 22 years.  Lung cancer screening. / Every year if you are aged 73-80 years and have a 30-pack-year history of smoking and currently smoke or have quit within the past 15 years. Yearly screening is stopped once you have quit smoking for at least 15 years or develop a health problem that would prevent you from having lung cancer  treatment.  Clinical breast exam.** / Every year after age 4 years.  BRCA-related cancer risk assessment.** / For women who have family members with a BRCA-related cancer (breast, ovarian, tubal, or peritoneal cancers).  Mammogram.** / Every year beginning at age 40 years and continuing for as long as you are in good health. Consult with your health care provider.  Pap test.** / Every 3 years starting at age 9 years through age 34 or 91 years with 3 consecutive normal Pap tests. Testing can be stopped between 65 and 70 years with 3 consecutive normal Pap tests and no abnormal Pap or HPV tests in the past 10 years.  HPV screening.** / Every 3 years from ages 57 years through ages 64 or 45 years with a history of 3 consecutive normal Pap tests. Testing can be stopped between 65 and 70 years with 3 consecutive normal Pap tests and no abnormal Pap or HPV tests in the past 10 years.  Fecal occult blood test (FOBT) of stool. / Every year beginning at age 15 years and continuing until age 17 years. You may not need to do this test if you get a colonoscopy every 10 years.  Flexible sigmoidoscopy or colonoscopy.** / Every 5 years for a flexible sigmoidoscopy or every 10 years for a colonoscopy beginning at age 86 years and continuing until age 71 years.  Hepatitis C blood test.** / For all people born from [birth year] through 1963 and any individual with known risks for hepatitis C.  Osteoporosis screening.** / A one-time screening for women ages 83 years and over and women at risk for fractures or osteoporosis.  Skin self-exam. / Monthly.  Influenza vaccine. / Every year.  Tetanus, diphtheria, and acellular pertussis (Tdap/Td) vaccine.** / 1 dose of Td every 10 years.  Varicella vaccine.** / Consult your health care provider.  Zoster vaccine.** / 1 dose for adults aged 61 years or older.  Pneumococcal 13-valent conjugate (PCV13) vaccine.** / Consult your health care provider.  Pneumococcal  polysaccharide (PPSV23) vaccine.** / 1 dose for all adults aged 28 years and older.  Meningococcal vaccine.** / Consult your health care provider.  Hepatitis A vaccine.** / Consult your health care provider.  Hepatitis B vaccine.** / Consult your health care provider.  Haemophilus influenzae type b (Hib) vaccine.** / Consult your health care provider. ** Family history and personal history of risk and conditions may change your health care provider's recommendations. Document Released: 08/04/2001 Document Revised: 10/23/2013 Document Reviewed: 11/03/2010 [facility] Patient Information 2015 [place], [place]. This information is not intended to replace advice given to you by your health care provider. Make sure you discuss any questions you have with your health care provider.

## 2015-02-01 NOTE — Progress Notes (Signed)
Subjective:   Candice Bennett is a 69 y.o. female who presents for Medicare Annual (Subsequent) preventive examination.  Review of Systems:   Review of Systems  Constitutional: Negative for activity change, appetite change and fatigue.  HENT: Negative for hearing loss, congestion, tinnitus and ear discharge.   Eyes: Negative for visual disturbance (see optho q1y -- vision corrected to 20/20 with glasses).  Respiratory: Negative for cough, chest tightness and shortness of breath.   Cardiovascular: Negative for chest pain, palpitations and leg swelling.  Gastrointestinal: Negative for abdominal pain, diarrhea, constipation and abdominal distention.  Genitourinary: Negative for urgency, frequency, decreased urine volume and difficulty urinating.  Musculoskeletal: Negative for back pain, arthralgias and gait problem.  Skin: Negative for color change, pallor and rash.  Neurological: Negative for dizziness, light-headedness, numbness and headaches.  Hematological: Negative for adenopathy. Does not bruise/bleed easily.  Psychiatric/Behavioral: Negative for suicidal ideas, confusion, sleep disturbance, self-injury, dysphoric mood, decreased concentration and agitation.  Pt is able to read and write and can do all ADLs No risk for falling No abuse/ violence in home           Objective:     Vitals: BP 108/74 mmHg  Pulse 73  Temp(Src) 98.3 F (36.8 C) (Oral)  Ht 5\' 3"  (1.6 m)  Wt 194 lb 9.6 oz (88.27 kg)  BMI 34.48 kg/m2  SpO2 98% BP 108/74 mmHg  Pulse 73  Temp(Src) 98.3 F (36.8 C) (Oral)  Ht 5\' 3"  (1.6 m)  Wt 194 lb 9.6 oz (88.27 kg)  BMI 34.48 kg/m2  SpO2 98% General appearance: alert, cooperative, appears stated age and no distress Head: Normocephalic, without obvious abnormality, atraumatic Eyes: conjunctivae/corneas clear. PERRL, EOM's intact. Fundi benign. Ears: normal TM's and external ear canals both ears Nose: Nares normal. Septum midline. Mucosa normal. No  drainage or sinus tenderness. Throat: lips, mucosa, and tongue normal; teeth and gums normal Neck: no adenopathy, no carotid bruit, no JVD, supple, symmetrical, trachea midline and thyroid not enlarged, symmetric, no tenderness/mass/nodules Back: symmetric, no curvature. ROM normal. No CVA tenderness. Lungs: clear to auscultation bilaterally Breasts: normal appearance, no masses or tenderness Heart: regular rate and rhythm, S1, S2 normal, no murmur, click, rub or gallop Abdomen: soft, non-tender; bowel sounds normal; no masses,  no organomegaly Pelvic: not indicated; post-menopausal, no abnormal Pap smears in past Extremities: extremities normal, atraumatic, no cyanosis or edema Pulses: 2+ and symmetric Skin: Skin color, texture, turgor normal. No rashes or lesions Lymph nodes: Cervical, supraclavicular, and axillary nodes normal. Neurologic: Alert and oriented X 3, normal strength and tone. Normal symmetric reflexes. Normal coordination and gait Psych- no depression, no anxiety  Tobacco History  Smoking status  . Never Smoker   Smokeless tobacco  . Never Used     Counseling given: Not Answered   Past Medical History  Diagnosis Date  . Unspecified essential hypertension   . Follicular lymphoma   . OSA (obstructive sleep apnea)   . Sarcoidosis    Past Surgical History  Procedure Laterality Date  . Carpal tunnel release    . Tubal ligation  1978  . Lymph node biopsy    . Abdominal hysterectomy  1983  . Port a cath insertion  2005  . Port-a-cath removal  2006   Family History  Problem Relation Age of Onset  . Lung cancer Father   . Diabetes Brother   . Heart disease Mother   . Heart disease Sister 71    chf  . Hypertension  Sister   . Colon cancer Neg Hx    History  Sexual Activity  . Sexual Activity:  . Partners: Male    Outpatient Encounter Prescriptions as of 02/01/2015  Medication Sig  . amLODipine (NORVASC) 5 MG tablet TAKE 1 TAB BY MOUTH DAY  . labetalol  (NORMODYNE) 100 MG tablet Take 1 tablet (100 mg total) by mouth 2 (two) times daily. Office visit due now  . mometasone (ELOCON) 0.1 % cream USED SPARINGLY ONCE DAILY  . nystatin (MYCOSTATIN/NYSTOP) 100000 UNIT/GM POWD Apply to affected area tid   No facility-administered encounter medications on file as of 02/01/2015.    Activities of Daily Living In your present state of health, do you have any difficulty performing the following activities: 02/01/2015  Hearing? N  Vision? N  Difficulty concentrating or making decisions? N  Walking or climbing stairs? N  Dressing or bathing? N  Doing errands, shopping? N    Patient Care Team: Rosalita Chessman, DO as PCP - General    Assessment:    CPE  Exercise Activities and Dietary recommendations-- con't current exercise    Goals    None     Fall Risk Fall Risk  02/01/2015 01/30/2014 12/09/2012  Falls in the past year? No Yes No  Number falls in past yr: - 2 or more -  Risk for fall due to : - (No Data) -  Risk for fall due to (comments): - Clothing and shoes have been the caused -   Depression Screen PHQ 2/9 Scores 02/01/2015 01/30/2014 12/09/2012  PHQ - 2 Score 0 0 0     Cognitive Testing mmse 30/30  Immunization History  Administered Date(s) Administered  . Influenza Whole 03/26/2009  . Pneumococcal Conjugate-13 01/30/2014  . Pneumococcal Polysaccharide-23 12/09/2012  . Td 08/10/2000  . Tdap 04/07/2011  . Zoster 02/10/2010   Screening Tests Health Maintenance  Topic Date Due  . Hepatitis C Screening  1945-07-21  . PAP SMEAR  12/10/2014  . INFLUENZA VACCINE  01/21/2015  . MAMMOGRAM  04/25/2016  . TETANUS/TDAP  04/06/2021  . COLONOSCOPY  08/19/2021  . DEXA SCAN  Completed  . ZOSTAVAX  Completed  . PNA vac Low Risk Adult  Completed      Plan:    CPE During the course of the visit the patient was educated and counseled about the following appropriate screening and preventive services:   Vaccines to include Pneumoccal,  Influenza, Hepatitis B, Td, Zostavax, HCV  Electrocardiogram  Cardiovascular Disease  Colorectal cancer screening  Bone density screening  Diabetes screening  Glaucoma screening  Mammography/PAP  Nutrition counseling   Patient Instructions (the written plan) was given to the patient.  1. Essential hypertension con't norvasc and labetalol - CBC - Comprehensive metabolic panel - Lipid panel  2. Vitamin B 12 deficiency   - Vitamin B12  3.history and physical examination of adult See avs  - CBC - Comprehensive metabolic panel - Lipid panel - POCT urinalysis dipstick - Hepatitis C Antibody  4. Eczema   - Ambulatory referral to Dermatology  Elizabeth Sauer, LPN  03/14/3006

## 2015-02-01 NOTE — Progress Notes (Signed)
Pre visit review using our clinic review tool, if applicable. No additional management support is needed unless otherwise documented below in the visit note. 

## 2015-02-21 ENCOUNTER — Other Ambulatory Visit: Payer: Self-pay | Admitting: Family Medicine

## 2015-03-22 ENCOUNTER — Other Ambulatory Visit: Payer: Self-pay | Admitting: Family Medicine

## 2015-05-14 ENCOUNTER — Other Ambulatory Visit: Payer: Self-pay

## 2015-05-14 MED ORDER — AMLODIPINE BESYLATE 5 MG PO TABS: 5.0000 mg | ORAL_TABLET | Freq: Every day | ORAL | Status: DC

## 2015-05-31 DIAGNOSIS — L818 Other specified disorders of pigmentation: Secondary | ICD-10-CM | POA: Diagnosis not present

## 2015-05-31 DIAGNOSIS — Z23 Encounter for immunization: Secondary | ICD-10-CM | POA: Diagnosis not present

## 2015-05-31 DIAGNOSIS — L300 Nummular dermatitis: Secondary | ICD-10-CM | POA: Diagnosis not present

## 2015-07-10 ENCOUNTER — Encounter: Payer: Self-pay | Admitting: Cardiovascular Disease

## 2015-07-16 ENCOUNTER — Telehealth: Payer: Self-pay | Admitting: Family Medicine

## 2015-07-16 NOTE — Telephone Encounter (Signed)
LM to schedule f/u visit (due around 08/04/15) and update records for flu shot

## 2015-07-19 ENCOUNTER — Other Ambulatory Visit: Payer: Self-pay

## 2015-07-19 DIAGNOSIS — Z1231 Encounter for screening mammogram for malignant neoplasm of breast: Secondary | ICD-10-CM

## 2015-08-08 ENCOUNTER — Encounter: Payer: Self-pay | Admitting: Family Medicine

## 2015-08-08 ENCOUNTER — Ambulatory Visit (INDEPENDENT_AMBULATORY_CARE_PROVIDER_SITE_OTHER): Payer: Medicare Other | Admitting: Family Medicine

## 2015-08-08 VITALS — BP 122/70 | HR 75 | Temp 98.5°F | Ht 63.0 in | Wt 198.2 lb

## 2015-08-08 DIAGNOSIS — I10 Essential (primary) hypertension: Secondary | ICD-10-CM | POA: Diagnosis not present

## 2015-08-08 LAB — LIPID PANEL
CHOL/HDL RATIO: 3
Cholesterol: 179 mg/dL (ref 0–200)
HDL: 61 mg/dL (ref >39.00)
LDL Cholesterol: 104 mg/dL — ABNORMAL HIGH (ref 0–99)
NonHDL: 117.53
Triglycerides: 66 mg/dL (ref 0.0–149.0)
VLDL: 13.2 mg/dL (ref 0.0–40.0)

## 2015-08-08 LAB — COMPREHENSIVE METABOLIC PANEL
ALT: 11 U/L (ref 0–35)
AST: 14 U/L (ref 0–37)
Albumin: 4.1 g/dL (ref 3.5–5.2)
Alkaline Phosphatase: 70 U/L (ref 39–117)
BILIRUBIN TOTAL: 0.4 mg/dL (ref 0.2–1.2)
BUN: 10 mg/dL (ref 6–23)
CO2: 32 mEq/L (ref 19–32)
Calcium: 9.5 mg/dL (ref 8.4–10.5)
Chloride: 104 mEq/L (ref 96–112)
Creatinine, Ser: 0.78 mg/dL (ref 0.40–1.20)
GFR: 94.46 mL/min (ref >60.00)
GLUCOSE: 97 mg/dL (ref 70–99)
Potassium: 4.1 mEq/L (ref 3.5–5.1)
Sodium: 141 mEq/L (ref 135–145)
Total Protein: 7.5 g/dL (ref 6.0–8.3)

## 2015-08-08 LAB — POCT URINALYSIS DIPSTICK
BILIRUBIN UA: NEGATIVE
Glucose, UA: NEGATIVE
KETONES UA: NEGATIVE
LEUKOCYTES UA: NEGATIVE
Nitrite, UA: NEGATIVE
PROTEIN UA: NEGATIVE
RBC UA: NEGATIVE
Spec Grav, UA: 1.025
Urobilinogen, UA: 0.2
pH, UA: 6

## 2015-08-08 LAB — MICROALBUMIN / CREATININE URINE RATIO
Creatinine,U: 236 mg/dL
MICROALB/CREAT RATIO: 0.4 mg/g (ref 0.0–30.0)
Microalb, Ur: 0.9 mg/dL (ref 0.0–1.9)

## 2015-08-08 MED ORDER — LABETALOL HCL 100 MG PO TABS: 100.0000 mg | ORAL_TABLET | Freq: Two times a day (BID) | ORAL | Status: DC

## 2015-08-08 NOTE — Patient Instructions (Signed)
Hypertension Hypertension, commonly called high blood pressure, is when the force of blood pumping through your arteries is too strong. Your arteries are the blood vessels that carry blood from your heart throughout your body. A blood pressure reading consists of a higher number over a lower number, such as 110/72. The higher number (systolic) is the pressure inside your arteries when your heart pumps. The lower number (diastolic) is the pressure inside your arteries when your heart relaxes. Ideally you want your blood pressure below 120/80. Hypertension forces your heart to work harder to pump blood. Your arteries may become narrow or stiff. Having untreated or uncontrolled hypertension can cause heart attack, stroke, kidney disease, and other problems. RISK FACTORS Some risk factors for high blood pressure are controllable. Others are not.  Risk factors you cannot control include:   Race. You may be at higher risk if you are African American.  Age. Risk increases with age.  Gender. Men are at higher risk than women before age 45 years. After age 65, women are at higher risk than men. Risk factors you can control include:  Not getting enough exercise or physical activity.  Being overweight.  Getting too much fat, sugar, calories, or salt in your diet.  Drinking too much alcohol. SIGNS AND SYMPTOMS Hypertension does not usually cause signs or symptoms. Extremely high blood pressure (hypertensive crisis) may cause headache, anxiety, shortness of breath, and nosebleed. DIAGNOSIS To check if you have hypertension, your health care provider will measure your blood pressure while you are seated, with your arm held at the level of your heart. It should be measured at least twice using the same arm. Certain conditions can cause a difference in blood pressure between your right and left arms. A blood pressure reading that is higher than normal on one occasion does not mean that you need treatment. If  it is not clear whether you have high blood pressure, you may be asked to return on a different day to have your blood pressure checked again. Or, you may be asked to monitor your blood pressure at home for 1 or more weeks. TREATMENT Treating high blood pressure includes making lifestyle changes and possibly taking medicine. Living a healthy lifestyle can help lower high blood pressure. You may need to change some of your habits. Lifestyle changes may include:  Following the DASH diet. This diet is high in fruits, vegetables, and whole grains. It is low in salt, red meat, and added sugars.  Keep your sodium intake below 2,300 mg per day.  Getting at least 30-45 minutes of aerobic exercise at least 4 times per week.  Losing weight if necessary.  Not smoking.  Limiting alcoholic beverages.  Learning ways to reduce stress. Your health care provider may prescribe medicine if lifestyle changes are not enough to get your blood pressure under control, and if one of the following is true:  You are 18-59 years of age and your systolic blood pressure is above 140.  You are 60 years of age or older, and your systolic blood pressure is above 150.  Your diastolic blood pressure is above 90.  You have diabetes, and your systolic blood pressure is over 140 or your diastolic blood pressure is over 90.  You have kidney disease and your blood pressure is above 140/90.  You have heart disease and your blood pressure is above 140/90. Your personal target blood pressure may vary depending on your medical conditions, your age, and other factors. HOME CARE INSTRUCTIONS    Have your blood pressure rechecked as directed by your health care provider.   Take medicines only as directed by your health care provider. Follow the directions carefully. Blood pressure medicines must be taken as prescribed. The medicine does not work as well when you skip doses. Skipping doses also puts you at risk for  problems.  Do not smoke.   Monitor your blood pressure at home as directed by your health care provider. SEEK MEDICAL CARE IF:   You think you are having a reaction to medicines taken.  You have recurrent headaches or feel dizzy.  You have swelling in your ankles.  You have trouble with your vision. SEEK IMMEDIATE MEDICAL CARE IF:  You develop a severe headache or confusion.  You have unusual weakness, numbness, or feel faint.  You have severe chest or abdominal pain.  You vomit repeatedly.  You have trouble breathing. MAKE SURE YOU:   Understand these instructions.  Will watch your condition.  Will get help right away if you are not doing well or get worse.   This information is not intended to replace advice given to you by your health care provider. Make sure you discuss any questions you have with your health care provider.   Document Released: 06/08/2005 Document Revised: 10/23/2014 Document Reviewed: 03/31/2013 Elsevier Interactive Patient Education 2016 [employer] Inc[employer]  

## 2015-08-08 NOTE — Progress Notes (Signed)
Pre visit review using our clinic review tool, if applicable. No additional management support is needed unless otherwise documented below in the visit note. 

## 2015-08-11 NOTE — Progress Notes (Signed)
Patient ID: Candice Bennett, female    DOB: 01-25-1946  Age: 70 y.o. MRN: [id number]    Subjective:  Subjective HPI Candice Bennett presents for bp check.   No complaints.    Review of Systems  Constitutional: Negative for diaphoresis, appetite change, fatigue and unexpected weight change.  Eyes: Negative for pain, redness and visual disturbance.  Respiratory: Negative for cough, chest tightness, shortness of breath and wheezing.   Cardiovascular: Negative for chest pain, palpitations and leg swelling.  Endocrine: Negative for cold intolerance, heat intolerance, polydipsia, polyphagia and polyuria.  Genitourinary: Negative for dysuria, frequency and difficulty urinating.  Neurological: Negative for dizziness, light-headedness, numbness and headaches.    History Past Medical History  Diagnosis Date  . Unspecified essential hypertension   . Follicular lymphoma ([place])   . OSA (obstructive sleep apnea)   . Sarcoidosis ([place])     She has past surgical history that includes Carpal tunnel release; Tubal ligation (1978); Lymph node biopsy; Abdominal hysterectomy (1983); port a cath insertion (2005); and Port-a-cath removal (2006).   Her family history includes Diabetes in her brother; Heart disease in her mother; Heart disease (age of onset: 62) in her sister; Hypertension in her sister; Lung cancer in her father. There is no history of Colon cancer.She reports that she has never smoked. She has never used smokeless tobacco. She reports that she does not drink alcohol or use illicit drugs.  Current Outpatient Prescriptions on File Prior to Visit  Medication Sig Dispense Refill  . amLODipine (NORVASC) 5 MG tablet Take 1 tablet (5 mg total) by mouth daily. 90 tablet 1  . mometasone (ELOCON) 0.1 % cream USED SPARINGLY ONCE DAILY 45 g 1   No current facility-administered medications on file prior to visit.     Objective:  Objective Physical Exam  Constitutional: She is oriented to  person, place, and time. She appears well-developed and well-nourished.  HENT:  Head: Normocephalic and atraumatic.  Eyes: Conjunctivae and EOM are normal.  Neck: Normal range of motion. Neck supple. No JVD present. Carotid bruit is not present. No thyromegaly present.  Cardiovascular: Normal rate, regular rhythm and normal heart sounds.   No murmur heard. Pulmonary/Chest: Effort normal and breath sounds normal. No respiratory distress. She has no wheezes. She has no rales. She exhibits no tenderness.  Musculoskeletal: She exhibits no edema.  Neurological: She is alert and oriented to person, place, and time.  Psychiatric: She has a normal mood and affect.  Nursing note and vitals reviewed.  BP 122/70 mmHg  Pulse 75  Temp(Src) 98.5 F (36.9 C) (Oral)  Ht 5' 3"  (1.6 m)  Wt 198 lb 3.2 oz (89.903 kg)  BMI 35.12 kg/m2  SpO2 98% Wt Readings from Last 3 Encounters:  08/08/15 198 lb 3.2 oz (89.903 kg)  02/01/15 194 lb 9.6 oz (88.27 kg)  01/29/15 195 lb (88.451 kg)     Lab Results  Component Value Date   WBC 7.4 02/01/2015   HGB 12.0 02/01/2015   HCT 37.7 02/01/2015   PLT 355.0 02/01/2015   GLUCOSE 97 08/08/2015   CHOL 179 08/08/2015   TRIG 66.0 08/08/2015   HDL 61.00 08/08/2015   LDLCALC 104* 08/08/2015   ALT 11 08/08/2015   AST 14 08/08/2015   NA 141 08/08/2015   K 4.1 08/08/2015   CL 104 08/08/2015   CREATININE 0.78 08/08/2015   BUN 10 08/08/2015   CO2 32 08/08/2015   TSH 0.86 04/07/2011   MICROALBUR 0.9 08/08/2015  Dg Bone Density  04/27/2014  CLINICAL DATA:  70 year old postmenopausal black female with history of non-Hodgkin's lymphoma in 2005 post chemotherapy/prednisone, hypertension, and prior hysterectomy at age 26. Follow-up exam. EXAM: DUAL X-RAY ABSORPTIOMETRY (DXA) FOR BONE MINERAL DENSITY FINDINGS: AP LUMBAR SPINE Bone Mineral Density (BMD):  0.929 g/cm2 Young Adult T-Score:  -1.1 Z-Score:  0.1 LEFT FEMUR NECK Bone Mineral Density (BMD):  0.714 g/cm2 Young  Adult T-Score: -1.2 Z-Score:  -0.3 ASSESSMENT: Patient's diagnostic category is LOW BONE MASS by WHO Criteria. FRACTURE RISK: Increased FRAX: Based on the [place] model, the 10 year probability of a major osteoporotic fracture is 3.5%. The 10 year probability of a hip fracture is 0.3%. COMPARISON: Since a prior exam dated 04/21/2011 there has been no significant interval change in bone mineral density of the lumbar spine and no significant interval change in bone mineral density of the left hip. Effective therapies are available in the form of bisphosphonates, selective estrogen receptor modulators, biologic agents, and hormone replacement therapy (for women). All patients should ensure an adequate intake of dietary calcium (1200 mg daily) and vitamin D (800 IU daily) unless contraindicated. All treatment decisions require clinical judgment and consideration of individual patient factors, including patient preferences, co-morbidities, previous drug use, risk factors not captured in the FRAX model (e.g., frailty, falls, vitamin D deficiency, increased bone turnover, interval significant decline in bone density) and possible under- or over-estimation of fracture risk by FRAX. The National Osteoporosis Foundation recommends that FDA-approved medical therapies be considered in postmenopausal women and men age 42 or older with a: 1. Hip or vertebral (clinical or morphometric) fracture. 2. T-score of -2.5 or lower at the spine or hip. 3. Ten-year fracture probability by FRAX of 3% or greater for hip fracture or 20% or greater for major osteoporotic fracture. People with diagnosed cases of osteoporosis or at high risk for fracture should have regular bone mineral density tests. For patients eligible for Medicare, routine testing is allowed once every 2 years. The testing frequency can be increased to one year for patients who have rapidly progressing disease, those who are receiving or discontinuing  medical therapy to restore bone mass, or have additional risk factors. [facility] [employer]) Criteria: Normal: T-scores from +1.0 to -1.0 Low Bone Mass (Osteopenia): T-scores between -1.0 and -2.5 Osteoporosis: T-scores -2.5 and below Comparison to Reference Population: T-score is the key measure used in the diagnosis of osteoporosis and relative risk determination for fracture. It provides a value for bone mass relative to the mean bone mass of a young adult reference population expressed in terms of standard deviation (SD). Z-score is the age-matched score showing the patient's values compared to a population matched for age, sex, and race. This is also expressed in terms of standard deviation. The patient may have values that compare favorably to the age-matched values and still be at increased risk for fracture. Electronically Signed   By: Everlean Alstrom M.D.   On: 04/27/2014 14:07   Mm Screening Breast Tomo Bilateral  04/26/2014  CLINICAL DATA:  Screening. EXAM: DIGITAL SCREENING BILATERAL MAMMOGRAM WITH 3D TOMO WITH CAD COMPARISON:  Previous exam(s). ACR Breast Density Category c: The breast tissue is heterogeneously dense, which may obscure small masses. FINDINGS: There are no findings suspicious for malignancy. Images were processed with CAD. IMPRESSION: No mammographic evidence of malignancy. A result letter of this screening mammogram will be mailed directly to the patient. RECOMMENDATION: Screening mammogram in one year. (Code:SM-B-01Y) BI-RADS CATEGORY  1:  Negative. Electronically Signed   By: Shon Hale M.D.   On: 04/26/2014 11:28     Assessment & Plan:  Plan I have discontinued Ms. Fowers's nystatin. I am also having her maintain her mometasone, amLODipine, and labetalol.  Meds ordered this encounter  Medications  . labetalol (NORMODYNE) 100 MG tablet    Sig: Take 1 tablet (100 mg total) by mouth 2 (two) times daily.    Dispense:  180 tablet    Refill:  1    Problem List  Items Addressed This Visit      Unprioritized   Essential hypertension - Primary    Stable con't labetalol      Relevant Medications   labetalol (NORMODYNE) 100 MG tablet   Other Relevant Orders   Comp Met (CMET) (Completed)   Lipid panel (Completed)   Microalbumin / creatinine urine ratio (Completed)   POCT urinalysis dipstick (Completed)      Follow-up: Return in about 6 months (around 02/05/2016), or if symptoms worsen or fail to improve, for annual exam, fasting.  Garnet Koyanagi, DO

## 2015-08-11 NOTE — Assessment & Plan Note (Signed)
Stable con't labetalol

## 2015-08-23 ENCOUNTER — Ambulatory Visit
Admission: RE | Admit: 2015-08-23 | Discharge: 2015-08-23 | Disposition: A | Discharge status: Home / Self Care | Payer: Medicare Other | Source: Ambulatory Visit

## 2015-08-23 DIAGNOSIS — Z1231 Encounter for screening mammogram for malignant neoplasm of breast: Secondary | ICD-10-CM

## 2015-11-11 ENCOUNTER — Other Ambulatory Visit: Payer: Self-pay | Admitting: Family Medicine

## 2016-02-11 ENCOUNTER — Other Ambulatory Visit: Payer: Self-pay | Admitting: Family Medicine

## 2016-02-11 DIAGNOSIS — I10 Essential (primary) hypertension: Secondary | ICD-10-CM

## 2016-04-06 ENCOUNTER — Telehealth: Payer: Self-pay | Admitting: *Deleted

## 2016-04-06 DIAGNOSIS — I700 Atherosclerosis of aorta: Secondary | ICD-10-CM | POA: Insufficient documentation

## 2016-04-06 NOTE — Telephone Encounter (Signed)
Problem List updated as directed. 

## 2016-04-06 NOTE — Telephone Encounter (Signed)
-----   Message from [name], DO sent at 04/04/2016  9:56 AM EDT ----- Regarding: RE: RAF Opportunity Yes to all 3 ----- Message ----- From: Dorrene German, RN Sent: 04/03/2016   3:12 PM To: [name], DO, # Subject: RAF Opportunity                                Per chart review, pt has mild atherosclerosis of aortic arch based on 04/04/14 CT soft tissue neck and BMI of 35.12.  Can diagnosis of aortic atherosclerosis be added to problem list?   Can current diagnosis of 'Obesity (BMI 30-39.9)' be updated to specify 'Obesity (BMI 35-39.9)'?  Can diagnosis of URI from 8 years ago be removed from problem list?   Thanks, Hoyle Sauer

## 2016-04-22 ENCOUNTER — Other Ambulatory Visit: Payer: Self-pay

## 2016-04-22 MED ORDER — AMLODIPINE BESYLATE 5 MG PO TABS: 5.0000 mg | ORAL_TABLET | Freq: Every day | ORAL | 0 refills | Status: DC

## 2016-04-22 NOTE — Telephone Encounter (Signed)
Refill sent to Pharmacy. Pt was due for follow up with Dr. Carollee Herter 01/2016. Please call Pt to schedule an appointment soon. Pt will need to be seen for further refills.

## 2016-04-24 NOTE — Telephone Encounter (Signed)
Appt scheduled

## 2016-04-27 ENCOUNTER — Ambulatory Visit: Payer: Medicare Other | Admitting: Family Medicine

## 2016-04-27 ENCOUNTER — Telehealth: Payer: Self-pay | Admitting: Family Medicine

## 2016-04-27 NOTE — Telephone Encounter (Signed)
No charge. 

## 2016-04-27 NOTE — Telephone Encounter (Signed)
Pt came in, checked in, pt was late to her appt. But per pcp she is working to "work" pt in. Pt waited for about an hour then decided to just reschedule her appt.  Pt rescheduled her appt for 05/11/16.

## 2016-05-07 ENCOUNTER — Telehealth: Payer: Self-pay | Admitting: Family Medicine

## 2016-05-07 NOTE — Telephone Encounter (Signed)
Awv scheduled for Candice Bennett on 05/08/2016 at 9:30 am. Pt also has a f/u scheduled at 9:15 am. Pt would like to know if she needs to schedule fasting labs before her visit.

## 2016-05-07 NOTE — Telephone Encounter (Signed)
Not due for labs per PCP result note on 08/12/15: Recheck labs in 1 year.

## 2016-05-07 NOTE — Telephone Encounter (Signed)
Called Ms. Candice Bennett to schedule awv with health coach. Left msg for pt to call office to schedule appt.

## 2016-05-08 ENCOUNTER — Ambulatory Visit (HOSPITAL_BASED_OUTPATIENT_CLINIC_OR_DEPARTMENT_OTHER)
Admission: RE | Admit: 2016-05-08 | Discharge: 2016-05-08 | Disposition: A | Discharge status: Home / Self Care | Payer: Medicare Other | Source: Ambulatory Visit | Attending: Family Medicine | Admitting: Family Medicine

## 2016-05-08 ENCOUNTER — Ambulatory Visit (INDEPENDENT_AMBULATORY_CARE_PROVIDER_SITE_OTHER): Payer: Medicare Other | Admitting: Family Medicine

## 2016-05-08 ENCOUNTER — Encounter: Payer: Self-pay | Admitting: Family Medicine

## 2016-05-08 VITALS — BP 118/78 | HR 73 | Temp 97.8°F | Resp 16 | Ht 63.0 in | Wt 198.4 lb

## 2016-05-08 DIAGNOSIS — M5136 Other intervertebral disc degeneration, lumbar region: Secondary | ICD-10-CM | POA: Insufficient documentation

## 2016-05-08 DIAGNOSIS — G4733 Obstructive sleep apnea (adult) (pediatric): Secondary | ICD-10-CM

## 2016-05-08 DIAGNOSIS — I700 Atherosclerosis of aorta: Secondary | ICD-10-CM | POA: Insufficient documentation

## 2016-05-08 DIAGNOSIS — Z780 Asymptomatic menopausal state: Secondary | ICD-10-CM | POA: Diagnosis not present

## 2016-05-08 DIAGNOSIS — I10 Essential (primary) hypertension: Secondary | ICD-10-CM | POA: Diagnosis not present

## 2016-05-08 DIAGNOSIS — Z0000 Encounter for general adult medical examination without abnormal findings: Secondary | ICD-10-CM

## 2016-05-08 DIAGNOSIS — E669 Obesity, unspecified: Secondary | ICD-10-CM

## 2016-05-08 DIAGNOSIS — M5442 Lumbago with sciatica, left side: Secondary | ICD-10-CM | POA: Diagnosis not present

## 2016-05-08 DIAGNOSIS — M545 Low back pain: Secondary | ICD-10-CM | POA: Diagnosis not present

## 2016-05-08 LAB — BASIC METABOLIC PANEL
BUN: 10 mg/dL (ref 6–23)
CO2: 30 mEq/L (ref 19–32)
Calcium: 9.7 mg/dL (ref 8.4–10.5)
Chloride: 104 mEq/L (ref 96–112)
Creatinine, Ser: 0.8 mg/dL (ref 0.40–1.20)
GFR: 91.54 mL/min (ref >60.00)
Glucose, Bld: 103 mg/dL — ABNORMAL HIGH (ref 70–99)
Potassium: 4.7 mEq/L (ref 3.5–5.1)
SODIUM: 141 meq/L (ref 135–145)

## 2016-05-08 MED ORDER — TIZANIDINE HCL 4 MG PO CAPS: 4.0000 mg | ORAL_CAPSULE | Freq: Three times a day (TID) | ORAL | 0 refills | Status: DC

## 2016-05-08 MED ORDER — MELOXICAM 15 MG PO TABS: 15.0000 mg | ORAL_TABLET | Freq: Every day | ORAL | 0 refills | Status: DC

## 2016-05-08 NOTE — Progress Notes (Signed)
Subjective:   Candice Bennett is a 70 y.o. female who presents for Medicare Annual (Subsequent) preventive examination.  Review of Systems:  No ROS.  Medicare Wellness Visit.  Cardiac Risk Factors include: advanced age (>31mn, >>37women);hypertension;obesity (BMI >30kg/m2);sedentary lifestyle  Sleep patterns: Restless sleep. Pt stays up late at night, does not have a regular bedtime routine. H/o sleep apnea on CPAP, not compliant w/ CPAP. "I threw the thing away."   Home Safety/Smoke Alarms: Lives in 2 story townhouse w/ daughter and granddaughter. Feels safe in home. Normally able to navigate stairs w/o difficulty, but is currently having some back pain that makes it harder. Smoke alarms in home. Living environment; residence and Firearm Safety: No firearms. Seat Belt Safety/Bike Helmet: Wears seat belt about 50% of the time. Pt states she knows she needs to wear seat belt, but finds it inconvenient. Pt counseled on safe driving, seat belt use.    Counseling:   Eye Exam- Last eye exam about 2 years ago. Dental- Dr. OJolayne Pantherevery 4-6 months  Female:   Pap- Hysterectomy       Mammo- last 08/23/15, BI-RADS Category 1: Negative.       Dexa scan- 05/05/14 [place]. Not currently taking calcium + vit D supplement.       CCS- last 08/20/11 w/ Dr. RErskine Emery Diverticulosis in sigmoid colon, otherwise normal. 10 year recall.     Objective:     Vitals: BP 118/78 (BP Location: Left Arm, Patient Position: Sitting, Cuff Size: Large)   Pulse 73   Temp 97.8 F (36.6 C) (Oral)   Resp 16   Ht _0  (1.6 m)   Wt 198 lb 6.4 oz (90 kg)   SpO2 98%   BMI 35.14 kg/m   Body mass index is 35.14 kg/m.   Tobacco History  Smoking Status  . Never Smoker  Smokeless Tobacco  . Never Used     Counseling given: Not Answered   Past Medical History:  Diagnosis Date  . Follicular lymphoma ([place]   . OSA (obstructive sleep apnea)   . [place]   . Unspecified essential hypertension      Past Surgical History:  Procedure Laterality Date  . ABDOMINAL HYSTERECTOMY  1983  . CARPAL TUNNEL RELEASE    . LYMPH NODE BIOPSY    . port a cath insertion  2005  . PORT-A-CATH REMOVAL  2006  . TUBAL LIGATION  1978   Family History  Problem Relation Age of Onset  . Lung cancer Father   . Diabetes Brother   . Colon cancer Brother   . Heart disease Mother   . Heart disease Sister [id number]   chf  . Hypertension Sister   . Multiple myeloma Sister   . Bell's palsy Sister   . Diabetes Sister    History  Sexual Activity  . Sexual activity: Not Currently  . Partners: Male    Outpatient Encounter Prescriptions as of 05/08/2016  Medication Sig  . amLODipine (NORVASC) 5 MG tablet Take 1 tablet (5 mg total) by mouth daily.  .Marland Kitchenlabetalol (NORMODYNE) 100 MG tablet TAKE ONE TABLET BY MOUTH TWICE DAILY  . mometasone (ELOCON) 0.1 % cream USED SPARINGLY ONCE DAILY  . meloxicam (MOBIC) 15 MG tablet Take 1 tablet (15 mg total) by mouth daily.  .Marland KitchentiZANidine (ZANAFLEX) 4 MG capsule Take 1 capsule (4 mg total) by mouth 3 (three) times daily.   No facility-administered encounter medications on file as of 05/08/2016.  Activities of Daily Living In your present state of health, do you have any difficulty performing the following activities: 05/08/2016 08/08/2015  Hearing? N N  Vision? N N  Difficulty concentrating or making decisions? N N  Walking or climbing stairs? N N  Dressing or bathing? N N  Doing errands, shopping? N N  Preparing Food and eating ? N -  Using the Toilet? N -  In the past six months, have you accidently leaked urine? N -  Do you have problems with loss of bowel control? N -  Managing your Medications? N -  Managing your Finances? N -  Housekeeping or managing your Housekeeping? N -  Some recent data might be hidden   Hearing/Vision Screening Hearing Screening Comments: Able to hear conversational tones w/o difficulty. No issues reported. Vision Screening  Comments: Wearing glasses today. No issues reported. Sees eye doctor only PRN, cannot remember name of provider. Last visit about 2 years ago.     Patient Care Team: [name], DO as PCP - General Inda Castle, MD as Consulting Physician (Gastroenterology)    Assessment:    Physical assessment deferred to PCP.  Exercise Activities and Dietary recommendations Current Exercise Habits: Home exercise routine, Type of exercise: walking, Frequency (Times/Week): 1, Exercise limited by: orthopedic condition(s) (low back pain)   Diet (meal preparation, eat out, water intake, caffeinated beverages, dairy products, fruits and vegetables): in general, an "unhealthy" diet, on average, 2 meals per day. Eats [employer], [employer], [employer], [employer]. Pt does not like to cook anymore now that her children are grown. Pt's daughter does most of the cooking. Pt admits to dietary indiscretion, states she is trying to get back on track.  Goals    . Decrease soda or juice intake    . Increase physical activity    . Reduce fast food intake      Fall Risk Fall Risk  05/08/2016 08/08/2015 02/01/2015 02/01/2015 01/30/2014  Falls in the past year? No No No No Yes  Number falls in past yr: - - - - 2 or more  Risk for fall due to : - - - - (No Data)  Risk for fall due to (comments): - - - - Clothing and shoes have been the caused   Depression Screen PHQ 2/9 Scores 05/08/2016 08/08/2015 02/01/2015 02/01/2015  PHQ - 2 Score 0 0 0 0     Cognitive Function MMSE - Mini Mental State Exam 05/08/2016  Orientation to time 5  Orientation to Place 5  Registration 3  Attention/ Calculation 5  Recall 3  Language- name 2 objects 2  Language- repeat 1  Language- follow 3 step command 3  Language- read & follow direction 1  Write a sentence 1  Copy design 1  Total score 30        Immunization History  Administered Date(s) Administered  . Influenza Whole 03/26/2009  . Pneumococcal Conjugate-13 01/30/2014  .  Pneumococcal Polysaccharide-23 12/09/2012  . Td 08/10/2000  . Tdap 04/07/2011  . Zoster 02/10/2010   Screening Tests Health Maintenance  Topic Date Due  . INFLUENZA VACCINE  09/19/2016 (Originally 01/21/2016)  . MAMMOGRAM  08/22/2017  . TETANUS/TDAP  04/06/2021  . COLONOSCOPY  08/19/2021  . DEXA SCAN  Completed  . ZOSTAVAX  Completed  . Hepatitis C Screening  Completed  . PNA vac Low Risk Adult  Completed      Plan:   Schedule eye appointment for vision exam.  Bring a copy of  your advanced directives to your next office visit.  Start to eat heart healthy diet (full of fruits, vegetables, whole grains, lean protein, water--limit salt, fat, and sugar intake) and increase physical activity as tolerated.  Due for DEXA > orders placed. Await results to determine need for supplementation/medical therapy per PCP.  Pulm referral for sleep study per Dr. Carollee Herter.  During the course of the visit the patient was educated and counseled about the following appropriate screening and preventive services:   Vaccines to include Pneumoccal, Influenza, Hepatitis B, Td, Zostavax, HCV  Cardiovascular Disease  Colorectal cancer screening  Bone density screening  Diabetes screening  Glaucoma screening  Mammography/PAP  Nutrition counseling   Patient Instructions (the written plan) was given to the patient.   Dorrene German, RN  05/08/2016

## 2016-05-08 NOTE — Assessment & Plan Note (Signed)
Noted on 03/2014 CT scan. Lipids stable on last check and BP stable today. PCP to follow for ongoing management.

## 2016-05-08 NOTE — Patient Instructions (Addendum)
Bring a copy of your advanced directives to your next office visit. Start to eat heart healthy diet (full of fruits, vegetables, whole grains, lean protein, water--limit salt, fat, and sugar intake) and increase physical activity as tolerated. Schedule an appointment w/ your eye doctor.  Hypertension Hypertension, commonly called high blood pressure, is when the force of blood pumping through your arteries is too strong. Your arteries are the blood vessels that carry blood from your heart throughout your body. A blood pressure reading consists of a higher number over a lower number, such as 110/72. The higher number (systolic) is the pressure inside your arteries when your heart pumps. The lower number (diastolic) is the pressure inside your arteries when your heart relaxes. Ideally you want your blood pressure below 120/80. Hypertension forces your heart to work harder to pump blood. Your arteries may become narrow or stiff. Having untreated or uncontrolled hypertension can cause heart attack, stroke, kidney disease, and other problems. What increases the risk? Some risk factors for high blood pressure are controllable. Others are not. Risk factors you cannot control include:  Race. You may be at higher risk if you are African American.  Age. Risk increases with age.  Gender. Men are at higher risk than women before age 9 years. After age 62, women are at higher risk than men. Risk factors you can control include:  Not getting enough exercise or physical activity.  Being overweight.  Getting too much fat, sugar, calories, or salt in your diet.  Drinking too much alcohol. What are the signs or symptoms? Hypertension does not usually cause signs or symptoms. Extremely high blood pressure (hypertensive crisis) may cause headache, anxiety, shortness of breath, and nosebleed. How is this diagnosed? To check if you have hypertension, your health care provider will measure your blood pressure  while you are seated, with your arm held at the level of your heart. It should be measured at least twice using the same arm. Certain conditions can cause a difference in blood pressure between your right and left arms. A blood pressure reading that is higher than normal on one occasion does not mean that you need treatment. If it is not clear whether you have high blood pressure, you may be asked to return on a different day to have your blood pressure checked again. Or, you may be asked to monitor your blood pressure at home for 1 or more weeks. How is this treated? Treating high blood pressure includes making lifestyle changes and possibly taking medicine. Living a healthy lifestyle can help lower high blood pressure. You may need to change some of your habits. Lifestyle changes may include:  Following the DASH diet. This diet is high in fruits, vegetables, and whole grains. It is low in salt, red meat, and added sugars.  Keep your sodium intake below 2,300 mg per day.  Getting at least 30-45 minutes of aerobic exercise at least 4 times per week.  Losing weight if necessary.  Not smoking.  Limiting alcoholic beverages.  Learning ways to reduce stress. Your health care provider may prescribe medicine if lifestyle changes are not enough to get your blood pressure under control, and if one of the following is true:  You are 43-84 years of age and your systolic blood pressure is above 140.  You are 60 years of age or older, and your systolic blood pressure is above 150.  Your diastolic blood pressure is above 90.  You have diabetes, and your systolic blood pressure  is over [id number] or your diastolic blood pressure is over 90.  You have kidney disease and your blood pressure is above 140/90.  You have heart disease and your blood pressure is above 140/90. Your personal target blood pressure may vary depending on your medical conditions, your age, and other factors. Follow these instructions  at home:  Have your blood pressure rechecked as directed by your health care provider.  Take medicines only as directed by your health care provider. Follow the directions carefully. Blood pressure medicines must be taken as prescribed. The medicine does not work as well when you skip doses. Skipping doses also puts you at risk for problems.  Do not smoke.  Monitor your blood pressure at home as directed by your health care provider. Contact a health care provider if:  You think you are having a reaction to medicines taken.  You have recurrent headaches or feel dizzy.  You have swelling in your ankles.  You have trouble with your vision. Get help right away if:  You develop a severe headache or confusion.  You have unusual weakness, numbness, or feel faint.  You have severe chest or abdominal pain.  You vomit repeatedly.  You have trouble breathing. This information is not intended to replace advice given to you by your health care provider. Make sure you discuss any questions you have with your health care provider. Document Released: 06/08/2005 Document Revised: 11/14/2015 Document Reviewed: 03/31/2013 Elsevier Interactive Patient Education  2017 [employer].

## 2016-05-08 NOTE — Assessment & Plan Note (Signed)
Previously followed by Dr. Gwenette Greet, last seen 2013. H/o non-compliance w/ CPAP, and currently does not even have supplies. She is interested in pursuing further workup/treatment at this time. Will refer to pulm per Dr. Carollee Herter.

## 2016-05-08 NOTE — Assessment & Plan Note (Signed)
Muscle relaxer And mobic

## 2016-05-08 NOTE — Progress Notes (Signed)
Patient ID: Candice Bennett, female    DOB: 08/08/1945  Age: 70 y.o. MRN: [id number]    Subjective:  Subjective  HPI FENNA SEMEL presents for f/u bp. + low back pain--  No known injury Symptoms x few weeks.  Pain radiates down L thigh.    Review of Systems  Constitutional: Negative for activity change, appetite change, fatigue and unexpected weight change.  Respiratory: Negative for cough and shortness of breath.   Cardiovascular: Negative for chest pain and palpitations.  Musculoskeletal: Positive for back pain.  Psychiatric/Behavioral: Negative for behavioral problems and dysphoric mood. The patient is not nervous/anxious.     History Past Medical History:  Diagnosis Date  . Follicular lymphoma ([place])   . OSA (obstructive sleep apnea)   . Sarcoidosis ([place])   . Unspecified essential hypertension     She has a past surgical history that includes Carpal tunnel release; Tubal ligation (1978); Lymph node biopsy; Abdominal hysterectomy (1983); port a cath insertion (2005); and Port-a-cath removal (2006).   Her family history includes Bell's palsy in her sister; Colon cancer in her brother; Diabetes in her brother and sister; Heart disease in her mother; Heart disease (age of onset: 29) in her sister; Hypertension in her sister; Lung cancer in her father; Multiple myeloma in her sister.She reports that she has never smoked. She has never used smokeless tobacco. She reports that she does not drink alcohol or use drugs.  Current Outpatient Prescriptions on File Prior to Visit  Medication Sig Dispense Refill  . amLODipine (NORVASC) 5 MG tablet Take 1 tablet (5 mg total) by mouth daily. 90 tablet 0  . labetalol (NORMODYNE) 100 MG tablet TAKE ONE TABLET BY MOUTH TWICE DAILY 60 tablet 5  . mometasone (ELOCON) 0.1 % cream USED SPARINGLY ONCE DAILY 45 g 1   No current facility-administered medications on file prior to visit.      Objective:  Objective  Physical Exam    Constitutional: She is oriented to person, place, and time. She appears well-developed and well-nourished.  HENT:  Head: Normocephalic and atraumatic.  Eyes: Conjunctivae and EOM are normal.  Neck: Normal range of motion. Neck supple. No JVD present. Carotid bruit is not present. No thyromegaly present.  Cardiovascular: Normal rate, regular rhythm and normal heart sounds.   No murmur heard. Pulmonary/Chest: Effort normal and breath sounds normal. No respiratory distress. She has no wheezes. She has no rales. She exhibits no tenderness.  Musculoskeletal: She exhibits tenderness. She exhibits no edema.  Neurological: She is alert and oriented to person, place, and time. She displays normal reflexes.  Psychiatric: She has a normal mood and affect. Her behavior is normal. Judgment and thought content normal.  Nursing note and vitals reviewed.  BP 118/78 (BP Location: Left Arm, Patient Position: Sitting, Cuff Size: Large)   Pulse 73   Temp 97.8 F (36.6 C) (Oral)   Resp 16   Ht 5' 3"  (1.6 m)   Wt 198 lb 6.4 oz (90 kg)   SpO2 98%   BMI 35.14 kg/m  Wt Readings from Last 3 Encounters:  05/08/16 198 lb 6.4 oz (90 kg)  08/08/15 198 lb 3.2 oz (89.9 kg)  02/01/15 194 lb 9.6 oz (88.3 kg)     Lab Results  Component Value Date   WBC 7.4 02/01/2015   HGB 12.0 02/01/2015   HCT 37.7 02/01/2015   PLT 355.0 02/01/2015   GLUCOSE 103 (H) 05/08/2016   CHOL 179 08/08/2015   TRIG 66.0 08/08/2015  HDL 61.00 08/08/2015   LDLCALC 104 (H) 08/08/2015   ALT 11 08/08/2015   AST 14 08/08/2015   NA 141 05/08/2016   K 4.7 05/08/2016   CL 104 05/08/2016   CREATININE 0.80 05/08/2016   BUN 10 05/08/2016   CO2 30 05/08/2016   TSH 0.86 04/07/2011   MICROALBUR 0.9 08/08/2015    Mm Screening Breast Tomo Bilateral  Result Date: 08/26/2015 CLINICAL DATA:  Screening. EXAM: DIGITAL SCREENING BILATERAL MAMMOGRAM WITH 3D TOMO WITH CAD COMPARISON:  Previous exam(s). ACR Breast Density Category b: There are  scattered areas of fibroglandular density. FINDINGS: There are no findings suspicious for malignancy. Images were processed with CAD. IMPRESSION: No mammographic evidence of malignancy. A result letter of this screening mammogram will be mailed directly to the patient. RECOMMENDATION: Screening mammogram in one year. (Code:SM-B-01Y) BI-RADS CATEGORY  1: Negative. Electronically Signed   By: Altamese Cabal M.D.   On: 08/26/2015 08:20     Assessment & Plan:  Plan  I am having Ms. Byus start on tiZANidine and meloxicam. I am also having her maintain her mometasone, labetalol, and amLODipine.  Meds ordered this encounter  Medications  . tiZANidine (ZANAFLEX) 4 MG capsule    Sig: Take 1 capsule (4 mg total) by mouth 3 (three) times daily.    Dispense:  30 capsule    Refill:  0  . meloxicam (MOBIC) 15 MG tablet    Sig: Take 1 tablet (15 mg total) by mouth daily.    Dispense:  30 tablet    Refill:  0    Problem List Items Addressed This Visit      Unprioritized   Essential hypertension   Relevant Orders   Basic metabolic panel (Completed)   Aortic atherosclerosis ([place])    Noted on 03/2014 CT scan. Lipids stable on last check and BP stable today. PCP to follow for ongoing management.      BACK PAIN, LUMBAR    Muscle relaxer And mobic      Relevant Medications   tiZANidine (ZANAFLEX) 4 MG capsule   meloxicam (MOBIC) 15 MG tablet   Other Relevant Orders   DG Lumbar Spine Complete (Completed)   Obesity, Class II, BMI 35-39.9    Increase physical activity as tolerated. Pt admits to dietary indiscretion, but is trying to get back on track. Dietary counseling provided and encouraged pt to start w/ small changes and diet/exercise commitments that she can keep to increase likelihood of success. Pt states she knows what she should be doing, but she is not practicing self-discipline.      Obstructive sleep apnea    Previously followed by Dr. Gwenette Greet, last seen 2013. H/o non-compliance w/  CPAP, and currently does not even have supplies. She is interested in pursuing further workup/treatment at this time. Will refer to pulm per Dr. Carollee Herter.       Relevant Orders   Ambulatory referral to Pulmonology    Other Visit Diagnoses    Encounter for Medicare annual wellness exam    -  Primary   Asymptomatic postmenopausal state       Relevant Orders   DG Bone Density      Follow-up: Return in about 3 months (around 08/08/2016) for annual exam, fasting.  [name], DO

## 2016-05-08 NOTE — Assessment & Plan Note (Signed)
Increase physical activity as tolerated. Pt admits to dietary indiscretion, but is trying to get back on track. Dietary counseling provided and encouraged pt to start w/ small changes and diet/exercise commitments that she can keep to increase likelihood of success. Pt states she knows what she should be doing, but she is not practicing self-discipline.

## 2016-05-08 NOTE — Progress Notes (Signed)
Pre visit review using our clinic review tool, if applicable. No additional management support is needed unless otherwise documented below in the visit note. 

## 2016-05-11 ENCOUNTER — Ambulatory Visit: Payer: Medicare Other | Admitting: Family Medicine

## 2016-05-20 ENCOUNTER — Telehealth: Payer: Self-pay | Admitting: Family Medicine

## 2016-05-20 NOTE — Telephone Encounter (Signed)
Caller name: Relationship to patient: Self Can be reached: [phone] Pharmacy:  Reason for call: Patient called because she is concerned about taking Mobic and Zanaflex. States she has read about the side effects of the medications and wants to be sure that it is safe. States she is not taking the Mobic any longer. She is taking the Zanaflex but does not see any improvement. Wants to know if she can take it PRN. Plse adv. May leave VM.

## 2016-05-21 NOTE — Telephone Encounter (Signed)
Patient called because she is concerned about taking Mobic and Zanaflex. States she has read about the side effects of the medications and wants to be sure that it is safe. States she is not taking the Mobic any longer. She is taking the Zanaflex but does not see any improvement. Wants to know if she can take it PRN. Plse adv.

## 2016-05-25 NOTE — Telephone Encounter (Signed)
Left message on pt's vm to give the office a call back, in regards to taking both Mobic and Zanaflex. Per provider, pt can take together temporarily. LB

## 2016-05-25 NOTE — Telephone Encounter (Signed)
It is ok to take prn-- and it is ok to take them together temporarily

## 2016-07-03 ENCOUNTER — Ambulatory Visit (INDEPENDENT_AMBULATORY_CARE_PROVIDER_SITE_OTHER): Payer: Medicare Other | Admitting: Pulmonary Disease

## 2016-07-03 ENCOUNTER — Encounter: Payer: Self-pay | Admitting: Pulmonary Disease

## 2016-07-03 VITALS — BP 118/82 | HR 85 | Ht 63.0 in | Wt 202.8 lb

## 2016-07-03 DIAGNOSIS — G4733 Obstructive sleep apnea (adult) (pediatric): Secondary | ICD-10-CM | POA: Diagnosis not present

## 2016-07-03 NOTE — Progress Notes (Signed)
Subjective:    Patient ID: Candice Bennett, female    DOB: 02/18/1946, 71 y.o.   MRN: [id number]  HPI   This is the case of Candice Bennett, 71 y.o. Female, who was referred by Dr. Carollee Herter  in consultation regarding OSA.   As you very well know, patient is a non smoker, not known to have asthma or copd, Was diagnosed with moderate sleep apnea in 2004. Sleep study showed AHI of 19. She was optimal on CPAP 11 cm water.   She was very symptomatic w/o CPAP.  Had snoring, gasping, choking, witnessed apneas, hypersomnia. Hypersomnia affected her fxnality.  She tried CPAP but was not tolerant to it. She was uncomfortable with it.  Had issues with mask. She stopped using it.   She has gotten more symptomatic.  Has snoring, gasping, choking, frequent awakening. Has hypersomnia in am.  Hypersomnia affects her fxnality. Occasional napping.  Has fallen asleep driving.   (-) abnormal behavior in sleep.   ESS 11.      Review of Systems  Constitutional: Negative.  Negative for fever and unexpected weight change.  HENT: Positive for postnasal drip and sore throat. Negative for congestion, dental problem, ear pain, nosebleeds, rhinorrhea, sinus pressure, sneezing and trouble swallowing.   Eyes: Negative.  Negative for redness and itching.  Respiratory: Positive for cough. Negative for chest tightness, shortness of breath and wheezing.   Cardiovascular: Negative.  Negative for palpitations and leg swelling.  Gastrointestinal: Negative.  Negative for nausea and vomiting.  Endocrine: Negative.   Genitourinary: Negative.  Negative for dysuria.  Musculoskeletal: Negative.  Negative for joint swelling.  Skin: Negative.  Negative for rash.  Allergic/Immunologic: Negative.   Neurological: Negative.  Negative for headaches.  Hematological: Negative.  Does not bruise/bleed easily.  Psychiatric/Behavioral: Negative.  Negative for dysphoric mood. The patient is not nervous/anxious.    Past Medical  History:  Diagnosis Date  . Follicular lymphoma ([place])   . OSA (obstructive sleep apnea)   . Sarcoidosis ([place])   . Unspecified essential hypertension    Had NHL > in remission.  (-) DVT.   Family History  Problem Relation Age of Onset  . Lung cancer Father   . Diabetes Brother   . Colon cancer Brother   . Heart disease Mother   . Heart disease Sister 76    chf  . Hypertension Sister   . Multiple myeloma Sister   . Bell's palsy Sister   . Diabetes Sister      Past Surgical History:  Procedure Laterality Date  . ABDOMINAL HYSTERECTOMY  1983  . CARPAL TUNNEL RELEASE    . LYMPH NODE BIOPSY    . port a cath insertion  2005  . PORT-A-CATH REMOVAL  2006  . TUBAL LIGATION  1978    Social History   Social History  . Marital status: Divorced    Spouse name: N/A  . Number of children: 2  . Years of education: N/A   Occupational History  . [employer]     Retired  .  [employer]   Social History Main Topics  . Smoking status: Never Smoker  . Smokeless tobacco: Never Used  . Alcohol use No  . Drug use: No  . Sexual activity: Not Currently    Partners: Male   Other Topics Concern  . Not on file   Social History Narrative   No regularly exercise   Lives in [place].   Allergies  Allergen Reactions  .  Iohexol Itching  . Ivp Dye [Iodinated Diagnostic Agents] Itching     Outpatient Medications Prior to Visit  Medication Sig Dispense Refill  . amLODipine (NORVASC) 5 MG tablet Take 1 tablet (5 mg total) by mouth daily. 90 tablet 0  . labetalol (NORMODYNE) 100 MG tablet TAKE ONE TABLET BY MOUTH TWICE DAILY 60 tablet 5  . mometasone (ELOCON) 0.1 % cream USED SPARINGLY ONCE DAILY 45 g 1  . meloxicam (MOBIC) 15 MG tablet Take 1 tablet (15 mg total) by mouth daily. (Patient not taking: Reported on 07/03/2016) 30 tablet 0  . tiZANidine (ZANAFLEX) 4 MG capsule Take 1 capsule (4 mg total) by mouth 3 (three) times daily. (Patient not taking: Reported on 07/03/2016) 30 capsule  0   No facility-administered medications prior to visit.    No orders of the defined types were placed in this encounter.        Objective:   Physical Exam   Vitals:  Vitals:   07/03/16 1550  BP: 118/82  Pulse: 85  SpO2: 98%  Weight: 202 lb 12.8 oz (92 kg)  Height: '5\' 3"'$  (1.6 m)    Constitutional/General:  Pleasant, well-nourished, well-developed, not in any distress,  Comfortably seating.  Well kempt  Body mass index is 35.92 kg/m. Wt Readings from Last 3 Encounters:  07/03/16 202 lb 12.8 oz (92 kg)  05/08/16 198 lb 6.4 oz (90 kg)  08/08/15 198 lb 3.2 oz (89.9 kg)     HEENT: Pupils equal and reactive to light and accommodation. Anicteric sclerae. Normal nasal mucosa.   No oral  lesions,  mouth clear,  oropharynx clear, no postnasal drip. (-) Oral thrush. No dental caries.  Airway - Mallampati class III  Neck: No masses. Midline trachea. No JVD, (-) LAD. (-) bruits appreciated.  Respiratory/Chest: Grossly normal chest. (-) deformity. (-) Accessory muscle use.  Symmetric expansion. (-) Tenderness on palpation.  Resonant on percussion.  Diminished BS on both lower lung zones. (-) wheezing, crackles, rhonchi (-) egophony  Cardiovascular: Regular rate and  rhythm, heart sounds normal, no murmur or gallops, no peripheral edema  Gastrointestinal:  Normal bowel sounds. Soft, non-tender. No hepatosplenomegaly.  (-) masses.   Musculoskeletal:  Normal muscle tone. Normal gait.   Extremities: Grossly normal. (-) clubbing, cyanosis.  (-) edema  Skin: (-) rash,lesions seen.   Neurological/Psychiatric : alert, oriented to time, place, person. Normal mood and affect         Assessment & Plan:  Obstructive sleep apnea Was diagnosed with moderate sleep apnea in 2004. Sleep study showed AHI of 19. She was optimal on CPAP 11 cm water.   She was very symptomatic w/o CPAP.  Had snoring, gasping, choking, witnessed apneas, hypersomnia. Hypersomnia affected her  fxnality.  She tried CPAP but was not tolerant to it. She was uncomfortable with it.  Had issues with mask. She stopped using it.   She has gotten more symptomatic.  Has snoring, gasping, choking, frequent awakening. Has hypersomnia in am.  Hypersomnia affects her fxnality. Occasional napping.  Has fallen asleep driving.   (-) abnormal behavior in sleep.   ESS 11.   Plan :   We discussed about the diagnosis of Obstructive Sleep Apnea (OSA) and implications of untreated OSA. We discussed about CPAP and BiPaP as possible treatment options.    We will schedule the patient for a sleep study. Plan for a split-night study. She had claustrophobia and was uncomfortable with her CPAP in 2004. Likely will need nasal pillows or  nasal mask. We discussed with her desensitization. She falls asleep watching TV so I suggested maybe as she is watching TV, she can put on her CPAP.   Patient was instructed to call the office if he/she has not heard back from the office 1-2 weeks after the sleep study.   Patient was instructed to call the office if he/she is having issues with the PAP device.   We discussed good sleep hygiene.   Patient was advised not to engage in activities requiring concentration and/or vigilance if he/she is sleepy.  Patient was advised not to drive if he/she is sleepy.      Thank you very much for letting me participate in this patient's care. Please do not hesitate to give me a call if you have any questions or concerns regarding the treatment plan.   Patient will follow up with me in 8-10 weeks.     Monica Becton, MD 07/03/2016   4:13 PM [facility] Pager: [phone] Office: [phone], Fax: [phone]

## 2016-07-03 NOTE — Assessment & Plan Note (Signed)
Was diagnosed with moderate sleep apnea in 2004. Sleep study showed AHI of 19. She was optimal on CPAP 11 cm water.   She was very symptomatic w/o CPAP.  Had snoring, gasping, choking, witnessed apneas, hypersomnia. Hypersomnia affected her fxnality.  She tried CPAP but was not tolerant to it. She was uncomfortable with it.  Had issues with mask. She stopped using it.   She has gotten more symptomatic.  Has snoring, gasping, choking, frequent awakening. Has hypersomnia in am.  Hypersomnia affects her fxnality. Occasional napping.  Has fallen asleep driving.   (-) abnormal behavior in sleep.   ESS 11.   Plan :   We discussed about the diagnosis of Obstructive Sleep Apnea (OSA) and implications of untreated OSA. We discussed about CPAP and BiPaP as possible treatment options.    We will schedule the patient for a sleep study. Plan for a split-night study. She had claustrophobia and was uncomfortable with her CPAP in 2004. Likely will need nasal pillows or nasal mask. We discussed with her desensitization. She falls asleep watching TV so I suggested maybe as she is watching TV, she can put on her CPAP.   Patient was instructed to call the office if he/she has not heard back from the office 1-2 weeks after the sleep study.   Patient was instructed to call the office if he/she is having issues with the PAP device.   We discussed good sleep hygiene.   Patient was advised not to engage in activities requiring concentration and/or vigilance if he/she is sleepy.  Patient was advised not to drive if he/she is sleepy.

## 2016-07-03 NOTE — Patient Instructions (Signed)
It was a pleasure taking care of you today!  We will schedule you to have a sleep study to determine if you have sleep apnea.     We will get a lab sleep study.  You will be scheduled to have a lab sleep study in 4-6 weeks.  Someone from the sleep lab will call you in 2-3 days to schedule the study with you.  They usually have cancellations every night so most likely, they will have openings for a lab sleep study next week or so.  We encourage you to do your sleep study then if possible. Please give us a call in a week is no one from the sleep lab calls you in 2-3 days.   If the sleep study is positive, we will order you a CPAP  machine.  Please call the office if you do NOT receive your machine in the next 1-2 weeks.   Please make sure you use your CPAP device everytime you sleep.  We will monitor the usage of your machine per your insurance requirement.  Your insurance company may take the machine from you if you are not using it regularly.   Please clean the mask, tubings, filter, water reservoir with soapy water every week.  Please use distilled water for the water reservoir.   Please call the office or your machine provider (DME company) if you are having issues with the device.   Return to clinic in 8-10 weeks with Dr. De Dios or NP    

## 2016-07-22 ENCOUNTER — Other Ambulatory Visit: Payer: Self-pay | Admitting: Family Medicine

## 2016-07-27 ENCOUNTER — Other Ambulatory Visit: Payer: Self-pay

## 2016-07-27 DIAGNOSIS — I10 Essential (primary) hypertension: Secondary | ICD-10-CM

## 2016-07-27 MED ORDER — LABETALOL HCL 100 MG PO TABS: 100.0000 mg | ORAL_TABLET | Freq: Two times a day (BID) | ORAL | 5 refills | Status: DC

## 2016-08-13 ENCOUNTER — Ambulatory Visit (INDEPENDENT_AMBULATORY_CARE_PROVIDER_SITE_OTHER): Payer: Medicare Other | Admitting: Family Medicine

## 2016-08-13 ENCOUNTER — Encounter: Payer: Self-pay | Admitting: Family Medicine

## 2016-08-13 VITALS — BP 126/70 | HR 82 | Temp 97.8°F | Resp 16 | Ht 62.5 in | Wt 201.0 lb

## 2016-08-13 DIAGNOSIS — M79605 Pain in left leg: Secondary | ICD-10-CM

## 2016-08-13 DIAGNOSIS — M545 Low back pain, unspecified: Secondary | ICD-10-CM

## 2016-08-13 DIAGNOSIS — Z23 Encounter for immunization: Secondary | ICD-10-CM

## 2016-08-13 DIAGNOSIS — I10 Essential (primary) hypertension: Secondary | ICD-10-CM

## 2016-08-13 LAB — COMPREHENSIVE METABOLIC PANEL
ALT: 16 U/L (ref 0–35)
AST: 17 U/L (ref 0–37)
Albumin: 4.3 g/dL (ref 3.5–5.2)
Alkaline Phosphatase: 62 U/L (ref 39–117)
BUN: 12 mg/dL (ref 6–23)
CALCIUM: 9.6 mg/dL (ref 8.4–10.5)
CHLORIDE: 104 meq/L (ref 96–112)
CO2: 31 mEq/L (ref 19–32)
Creatinine, Ser: 0.8 mg/dL (ref 0.40–1.20)
GFR: 91.47 mL/min (ref >60.00)
GLUCOSE: 101 mg/dL — AB (ref 70–99)
Potassium: 4.5 mEq/L (ref 3.5–5.1)
Sodium: 139 mEq/L (ref 135–145)
Total Bilirubin: 0.5 mg/dL (ref 0.2–1.2)
Total Protein: 7.7 g/dL (ref 6.0–8.3)

## 2016-08-13 LAB — CBC WITH DIFFERENTIAL/PLATELET
BASOS ABS: 0 10*3/uL (ref 0.0–0.1)
Basophils Relative: 0.3 % (ref 0.0–3.0)
Eosinophils Absolute: 0.1 10*3/uL (ref 0.0–0.7)
Eosinophils Relative: 2.1 % (ref 0.0–5.0)
HCT: 38.3 % (ref 36.0–46.0)
Hemoglobin: 12.3 g/dL (ref 12.0–15.0)
LYMPHS ABS: 2.9 10*3/uL (ref 0.7–4.0)
Lymphocytes Relative: 47.9 % — ABNORMAL HIGH (ref 12.0–46.0)
MCHC: 32.1 g/dL (ref 30.0–36.0)
MCV: 80.5 fl (ref 78.0–100.0)
Monocytes Absolute: 0.4 10*3/uL (ref 0.1–1.0)
Monocytes Relative: 7.3 % (ref 3.0–12.0)
NEUTROS ABS: 2.6 10*3/uL (ref 1.4–7.7)
Neutrophils Relative %: 42.4 % — ABNORMAL LOW (ref 43.0–77.0)
PLATELETS: 353 10*3/uL (ref 150.0–400.0)
RBC: 4.76 Mil/uL (ref 3.87–5.11)
RDW: 14.7 % (ref 11.5–15.5)
WBC: 6.1 10*3/uL (ref 4.0–10.5)

## 2016-08-13 LAB — POC URINALSYSI DIPSTICK (AUTOMATED)
BILIRUBIN UA: NEGATIVE
Glucose, UA: NEGATIVE
Ketones, UA: NEGATIVE
Leukocytes, UA: NEGATIVE
Nitrite, UA: NEGATIVE
Protein, UA: NEGATIVE
RBC UA: NEGATIVE
Spec Grav, UA: >=1.03
UROBILINOGEN UA: 0.2
pH, UA: 6

## 2016-08-13 LAB — LIPID PANEL
Cholesterol: 181 mg/dL (ref 0–200)
HDL: 62 mg/dL (ref >39.00)
LDL Cholesterol: 103 mg/dL — ABNORMAL HIGH (ref 0–99)
NONHDL: 118.65
TRIGLYCERIDES: 80 mg/dL (ref 0.0–149.0)
Total CHOL/HDL Ratio: 3
VLDL: 16 mg/dL (ref 0.0–40.0)

## 2016-08-13 MED ORDER — ZOSTER VAC RECOMB ADJUVANTED 50 MCG/0.5ML IM SUSR: 1.0000 mL | Freq: Once | INTRAMUSCULAR | 1 refills | Status: AC

## 2016-08-13 MED ORDER — AMLODIPINE BESYLATE 5 MG PO TABS: ORAL_TABLET | ORAL | 1 refills | Status: DC

## 2016-08-13 NOTE — Progress Notes (Signed)
Pre visit review using our clinic review tool, if applicable. No additional management support is needed unless otherwise documented below in the visit note. 

## 2016-08-13 NOTE — Progress Notes (Signed)
Patient ID: Candice Bennett, female   DOB: 22-Oct-1945, 71 y.o.   MRN: [id number]   I acted as a Education administrator for Dr. Carollee Herter.  Guerry Bruin, CMA     Subjective:    Patient ID: Candice Bennett, female    DOB: 1946-01-15, 71 y.o.   MRN: [id number]  Chief Complaint  Patient presents with  . Hypertension  . Back Pain    Hypertension  This is a chronic problem. The current episode started more than 1 year ago. The problem is controlled. Pertinent negatives include no blurred vision, chest pain, headaches, malaise/fatigue, palpitations or shortness of breath. There are no associated agents to hypertension. Past treatments include calcium channel blockers. The current treatment provides significant improvement. There are no compliance problems.   Back Pain  This is a recurrent problem. The current episode started more than 1 year ago. The pain is present in the lumbar spine. The pain radiates to the left thigh. The symptoms are aggravated by coughing. Stiffness is present all day. Pertinent negatives include no chest pain, fever or headaches. Treatments tried: glucosamine. The treatment provided mild relief.      Past Medical History:  Diagnosis Date  . Follicular lymphoma ([place])   . OSA (obstructive sleep apnea)   . Sarcoidosis ([place])   . Unspecified essential hypertension     Past Surgical History:  Procedure Laterality Date  . ABDOMINAL HYSTERECTOMY  1983  . CARPAL TUNNEL RELEASE    . LYMPH NODE BIOPSY    . port a cath insertion  2005  . PORT-A-CATH REMOVAL  2006  . TUBAL LIGATION  1978    Family History  Problem Relation Age of Onset  . Lung cancer Father   . Diabetes Brother   . Colon cancer Brother   . Heart disease Mother   . Heart disease Sister 75    chf  . Hypertension Sister   . Multiple myeloma Sister   . Bell's palsy Sister   . Diabetes Sister     Social History   Social History  . Marital status: Divorced    Spouse name: N/A  . Number of children: 2  .  Years of education: N/A   Occupational History  . [employer]     Retired  .  [employer]   Social History Main Topics  . Smoking status: Never Smoker  . Smokeless tobacco: Never Used  . Alcohol use No  . Drug use: No  . Sexual activity: Not Currently    Partners: Male   Other Topics Concern  . Not on file   Social History Narrative   No regularly exercise   Live with daughter and her daughter       Outpatient Medications Prior to Visit  Medication Sig Dispense Refill  . labetalol (NORMODYNE) 100 MG tablet Take 1 tablet (100 mg total) by mouth 2 (two) times daily. 60 tablet 5  . amLODipine (NORVASC) 5 MG tablet TAKE ONE TABLET BY MOUTH ONCE DAILY. MUST HAVE OFFICE VISIT BEFORE FURTHER REFILLS. 90 tablet 0  . meloxicam (MOBIC) 15 MG tablet Take 1 tablet (15 mg total) by mouth daily. (Patient not taking: Reported on 08/13/2016) 30 tablet 0  . tiZANidine (ZANAFLEX) 4 MG capsule Take 1 capsule (4 mg total) by mouth 3 (three) times daily. (Patient not taking: Reported on 07/03/2016) 30 capsule 0  . mometasone (ELOCON) 0.1 % cream USED SPARINGLY ONCE DAILY 45 g 1   No facility-administered medications prior to visit.  Allergies  Allergen Reactions  . Iohexol Itching  . Ivp Dye [Iodinated Diagnostic Agents] Itching    Review of Systems  Constitutional: Negative for fever and malaise/fatigue.  HENT: Negative for congestion.   Eyes: Negative for blurred vision.  Respiratory: Negative for cough and shortness of breath.   Cardiovascular: Negative for chest pain, palpitations and leg swelling.  Gastrointestinal: Negative for vomiting.  Musculoskeletal: Positive for back pain.  Skin: Negative for rash.  Neurological: Negative for loss of consciousness and headaches.       Objective:    Physical Exam  Constitutional: She is oriented to person, place, and time. She appears well-developed and well-nourished.  HENT:  Head: Normocephalic and atraumatic.  Eyes:  Conjunctivae and EOM are normal.  Neck: Normal range of motion. Neck supple. No JVD present. Carotid bruit is not present. No thyromegaly present.  Cardiovascular: Normal rate, regular rhythm and normal heart sounds.   No murmur heard. Pulmonary/Chest: Effort normal and breath sounds normal. No respiratory distress. She has no wheezes. She has no rales. She exhibits no tenderness.  Musculoskeletal: She exhibits no edema.  Neurological: She is alert and oriented to person, place, and time.  Psychiatric: She has a normal mood and affect.  Nursing note and vitals reviewed.   BP 126/70 (BP Location: Left Arm, Cuff Size: Large)   Pulse 82   Temp 97.8 F (36.6 C) (Oral)   Resp 16   Ht 5' 2.5" (1.588 m)   Wt 201 lb (91.2 kg)   SpO2 98%   BMI 36.18 kg/m  Wt Readings from Last 3 Encounters:  08/13/16 201 lb (91.2 kg)  07/03/16 202 lb 12.8 oz (92 kg)  05/08/16 198 lb 6.4 oz (90 kg)     Lab Results  Component Value Date   WBC 6.1 08/13/2016   HGB 12.3 08/13/2016   HCT 38.3 08/13/2016   PLT 353.0 08/13/2016   GLUCOSE 101 (H) 08/13/2016   CHOL 181 08/13/2016   TRIG 80.0 08/13/2016   HDL 62.00 08/13/2016   LDLCALC 103 (H) 08/13/2016   ALT 16 08/13/2016   AST 17 08/13/2016   NA 139 08/13/2016   K 4.5 08/13/2016   CL 104 08/13/2016   CREATININE 0.80 08/13/2016   BUN 12 08/13/2016   CO2 31 08/13/2016   TSH 0.86 04/07/2011   MICROALBUR 0.9 08/08/2015    Lab Results  Component Value Date   TSH 0.86 04/07/2011   Lab Results  Component Value Date   WBC 6.1 08/13/2016   HGB 12.3 08/13/2016   HCT 38.3 08/13/2016   MCV 80.5 08/13/2016   PLT 353.0 08/13/2016   Lab Results  Component Value Date   NA 139 08/13/2016   K 4.5 08/13/2016   CO2 31 08/13/2016   GLUCOSE 101 (H) 08/13/2016   BUN 12 08/13/2016   CREATININE 0.80 08/13/2016   BILITOT 0.5 08/13/2016   ALKPHOS 62 08/13/2016   AST 17 08/13/2016   ALT 16 08/13/2016   PROT 7.7 08/13/2016   ALBUMIN 4.3 08/13/2016    CALCIUM 9.6 08/13/2016   GFR 91.47 08/13/2016   Lab Results  Component Value Date   CHOL 181 08/13/2016   Lab Results  Component Value Date   HDL 62.00 08/13/2016   Lab Results  Component Value Date   LDLCALC 103 (H) 08/13/2016   Lab Results  Component Value Date   TRIG 80.0 08/13/2016   Lab Results  Component Value Date   CHOLHDL 3 08/13/2016   No results  found for: HGBA1C     Assessment & Plan:   Problem List Items Addressed This Visit      Unprioritized   Essential hypertension - Primary    Stable con't meds      Relevant Medications   amLODipine (NORVASC) 5 MG tablet   Other Relevant Orders   CBC with Differential/Platelet (Completed)   POCT Urinalysis Dipstick (Automated) (Completed)   Comprehensive metabolic panel (Completed)   Lipid panel (Completed)    Other Visit Diagnoses    Low back pain radiating to left leg       Relevant Orders   MR Lumbar Spine Wo Contrast   Need for shingles vaccine          I have discontinued Ms. Naraine's mometasone. I have also changed her amLODipine. Additionally, I am having her start on Zoster Vac Recomb Adjuvanted. Lastly, I am having her maintain her tiZANidine, meloxicam, labetalol, and Glucosamine Sulfate.  Meds ordered this encounter  Medications  . Glucosamine Sulfate 1000 MG CAPS    Sig: Take 1 capsule by mouth daily.  Marland Kitchen amLODipine (NORVASC) 5 MG tablet    Sig: TAKE ONE TABLET BY MOUTH ONCE DAILY    Dispense:  90 tablet    Refill:  1  . Zoster Vac Recomb Adjuvanted (SHINGRIX) 50 MCG SUSR    Sig: Inject 1 mL into the muscle once.    Dispense:  1 each    Refill:  1   CMA served as scribe during this visit. History, Physical and Plan performed by medical provider. Documentation and orders reviewed and attested to.   [name], DO

## 2016-08-13 NOTE — Patient Instructions (Addendum)

## 2016-08-16 NOTE — Assessment & Plan Note (Signed)
Stable con't meds 

## 2016-08-18 ENCOUNTER — Ambulatory Visit (HOSPITAL_BASED_OUTPATIENT_CLINIC_OR_DEPARTMENT_OTHER): Payer: Medicare Other | Attending: Pulmonary Disease | Admitting: Pulmonary Disease

## 2016-08-18 DIAGNOSIS — R5383 Other fatigue: Secondary | ICD-10-CM | POA: Insufficient documentation

## 2016-08-18 DIAGNOSIS — I493 Ventricular premature depolarization: Secondary | ICD-10-CM | POA: Insufficient documentation

## 2016-08-18 DIAGNOSIS — I10 Essential (primary) hypertension: Secondary | ICD-10-CM | POA: Diagnosis not present

## 2016-08-18 DIAGNOSIS — G4733 Obstructive sleep apnea (adult) (pediatric): Secondary | ICD-10-CM | POA: Diagnosis not present

## 2016-08-18 DIAGNOSIS — Z79899 Other long term (current) drug therapy: Secondary | ICD-10-CM | POA: Diagnosis not present

## 2016-08-22 ENCOUNTER — Telehealth: Payer: Self-pay | Admitting: Pulmonary Disease

## 2016-08-22 DIAGNOSIS — G4733 Obstructive sleep apnea (adult) (pediatric): Secondary | ICD-10-CM

## 2016-08-22 NOTE — Procedures (Signed)
NAME: Candice Bennett DATE OF BIRTH:  1945-07-05 MEDICAL RECORD NUMBER [id number]  LOCATION: [facility]  PHYSICIAN: [place]  DATE OF STUDY: 08/18/2016   CLINICAL INFORMATION  Sleep Study Type: Split Night CPAP  Indication for sleep study: Excessive Daytime Sleepiness, Fatigue, Hypertension, OSA  Epworth Sleepiness Score: 5   SLEEP STUDY TECHNIQUE  As per the AASM Manual for the Scoring of Sleep and Associated Events v2.3 (April 2016) with a hypopnea requiring 4% desaturations.  The channels recorded and monitored were frontal, central and occipital EEG, electrooculogram (EOG), submentalis EMG (chin), nasal and oral airflow, thoracic and abdominal wall motion, anterior tibialis EMG, snore microphone, electrocardiogram, and pulse oximetry. Continuous positive airway pressure (CPAP) was initiated when the patient met split night criteria and was titrated according to treat sleep-disordered breathing.  MEDICATIONS  Medications self-administered by patient taken the night of the study : LABETALOL, AMLODIPINE, ALEVE. Meds reviewed per chart review.  RESPIRATORY PARAMETERS  Diagnostic Total AHI (/hr):  13.5 RDI (/hr): 29.1 OA Index (/hr):  - CA Index (/hr):  0.0  REM AHI (/hr):  40.0 NREM AHI (/hr):  12.3 Supine AHI (/hr):  55.7 Non-supine AHI (/hr):  13.26  Min O2 Sat (%): 89.00 Mean O2 (%): 94.85 Time below 88% (min): 0.0    Titration Optimal Pressure (cm): 9 AHI at Optimal Pressure (/hr): 0.0 Min O2 at Optimal Pressure (%): 94.0  Supine % at Optimal (%): 100 Sleep % at Optimal (%): 100    SLEEP ARCHITECTURE  The recording time for the entire night was 408.3 minutes. During a baseline period of 230.1 minutes, the patient slept for 137.9 minutes in REM and nonREM, yielding a sleep efficiency of 59.9%. Sleep onset after lights out was 30.7 minutes with a REM latency of 132.5 minutes. The patient spent 13.77% of the night in stage N1 sleep, 81.88% in  stage N2 sleep, 0.00% in stage N3 and 4.35% in REM.  During the titration period of 177.1 minutes, the patient slept for 164.6 minutes in REM and nonREM, yielding a sleep efficiency of 92.9%. Sleep onset after CPAP initiation was 8.5 minutes with a REM latency of 12.0 minutes. The patient spent 3.95% of the night in stage N1 sleep, 65.07% in stage N2 sleep, 6.38% in stage N3 and 24.60% in REM.  CARDIAC DATA  The 2 lead EKG demonstrated sinus rhythm. The mean heart rate was 76.34 beats per minute. Other EKG findings include: PVCs.   LEG MOVEMENT DATA  The total Periodic Limb Movements of Sleep (PLMS) were 52. The PLMS index was 10.31 .  IMPRESSIONS  1. Mild-moderate obstructive sleep apnea occurred during the diagnostic portion of the study (AHI = 13.5 /hour). An optimal PAP pressure was selected for this patient ( 9 cm of water) 2. No significant central sleep apnea occurred during the diagnostic portion of the study (CAI = 0.0/hour). 3. Mild oxygen desaturation was noted during the diagnostic portion of the study (Min O2 = 89.00%). 4. The patient snored with Moderate snoring volume during the diagnostic portion of the study. 5. EKG findings include PVCs. 6. Mild periodic limb movements of sleep occurred during the study. 7.  DIAGNOSIS  Obstructive Sleep Apnea (327.23 [G47.33 ICD-10])  RECOMMENDATIONS  1. Trial of CPAP therapy on 9 cm H2O with a Small size Resmed Full Face Mask AirFit F20 mask and heated humidification. 2. Avoid alcohol, sedatives and other CNS depressants that may worsen sleep apnea and disrupt  normal sleep architecture. 3. Sleep hygiene should be reviewed to assess factors that may improve sleep quality. 4. Weight management and regular exercise should be initiated or continued. 5. Follow up in the office 4-6 weeks after obtaining cpap machine.  Monica Becton, MD 08/22/2016, 9:50 AM [facility] Pager [phone] After 3 pm or if no  answer, call [phone]  '

## 2016-08-22 NOTE — Telephone Encounter (Signed)
    Please call the pt and tell the pt the [place]  showed OSA .   Pt stops breathing  14  times an hour.    Please order an autocpap machine set at  9 cm H2O with a Small size Resmed Full Face Mask AirFit F20 mask and heated humidification.  Patient will need a 1 month download.   Patient needs to be seen by me or any of the NPs/APPs  4-6 weeks after obtaining the cpap machine. Let me know if you receive this.   Thanks!   J. Shirl Harris, MD 08/22/2016, 9:52 AM

## 2016-08-24 NOTE — Telephone Encounter (Signed)
[id number] x1 

## 2016-08-25 NOTE — Telephone Encounter (Signed)
Spoke with pt . She is aware of her sleep study results. Order has been placed for CPAP. Pt will call back for ROV once she is set up with CPAP. Nothing further was needed.

## 2016-08-25 NOTE — Telephone Encounter (Signed)
Unable to reach pt left message to call back. 

## 2016-10-13 ENCOUNTER — Ambulatory Visit (INDEPENDENT_AMBULATORY_CARE_PROVIDER_SITE_OTHER): Payer: Medicare Other | Admitting: Adult Health

## 2016-10-13 ENCOUNTER — Encounter: Payer: Self-pay | Admitting: Adult Health

## 2016-10-13 VITALS — BP 112/68 | HR 86 | Ht 62.5 in | Wt 201.2 lb

## 2016-10-13 DIAGNOSIS — G4733 Obstructive sleep apnea (adult) (pediatric): Secondary | ICD-10-CM

## 2016-10-13 DIAGNOSIS — E669 Obesity, unspecified: Secondary | ICD-10-CM | POA: Diagnosis not present

## 2016-10-13 NOTE — Progress Notes (Signed)
@Patient  ID: Candice Bennett, female    DOB: Nov 17, 1945, 71 y.o.   MRN: [id number]  Chief Complaint  Patient presents with  . Follow-up    OSA     Referring provider: [name], *  HPI: 71 yo female followed for Mild OSA   TEST  Sleep study 07/2016 AHI 14/hr   10/13/2016 Follow up: OSA  Patient presents for a for sleep apnea. Patient had been diagnosed originally in 2004 with moderate sleep apnea. Patient has felt that her C Pap was not tolerable.Marland Kitchen He returned January 2018 with increased snoring, witnessed apneic events and hypersomnia. Patient was set up for a sleep study on February 2018 that showed sleep apnea with AHI of 14/hr. she was started on C Pap. Patient says that she is trying to get used to her new machine. She is wearing it most nights. She's getting on average about 4 hours. She says she has not noticed much difference in her sleep regimen. She seems to have quite a few awakenings throughout the night due to mask shoes. Download shows 73% compliance with average usage at 4 hours. AHI 1.4. She is on a set CPAP pressure of 9 cm H2O. . We discussed alternative mask including nasal pillows  That may be more comfortable for her. We also discussed healthy sleep regimen.  Allergies  Allergen Reactions  . Iohexol Itching  . Ivp Dye [Iodinated Diagnostic Agents] Itching    Immunization History  Administered Date(s) Administered  . Influenza Whole 03/26/2009  . Pneumococcal Conjugate-13 01/30/2014  . Pneumococcal Polysaccharide-23 12/09/2012  . Td 08/10/2000  . Tdap 04/07/2011  . Zoster 02/10/2010    Past Medical History:  Diagnosis Date  . Follicular lymphoma ([place])   . OSA (obstructive sleep apnea)   . Sarcoidosis   . Unspecified essential hypertension     Tobacco History: History  Smoking Status  . Never Smoker  Smokeless Tobacco  . Never Used   Counseling given: Not Answered   Outpatient Encounter Prescriptions as of 10/13/2016  Medication  Sig  . amLODipine (NORVASC) 5 MG tablet TAKE ONE TABLET BY MOUTH ONCE DAILY  . labetalol (NORMODYNE) 100 MG tablet Take 1 tablet (100 mg total) by mouth 2 (two) times daily.  . Glucosamine Sulfate 1000 MG CAPS Take 1 capsule by mouth daily.  Marland Kitchen tiZANidine (ZANAFLEX) 4 MG capsule Take 1 capsule (4 mg total) by mouth 3 (three) times daily. (Patient not taking: Reported on 07/03/2016)  . [DISCONTINUED] meloxicam (MOBIC) 15 MG tablet Take 1 tablet (15 mg total) by mouth daily. (Patient not taking: Reported on 08/13/2016)   No facility-administered encounter medications on file as of 10/13/2016.      Review of Systems  Constitutional:   No  weight loss, night sweats,  Fevers, chills, + fatigue, or  lassitude.  HEENT:   No headaches,  Difficulty swallowing,  Tooth/dental problems, or  Sore throat,                No sneezing, itching, ear ache, nasal congestion, post nasal drip,   CV:  No chest pain,  Orthopnea, PND, swelling in lower extremities, anasarca, dizziness, palpitations, syncope.   GI  No heartburn, indigestion, abdominal pain, nausea, vomiting, diarrhea, change in bowel habits, loss of appetite, bloody stools.   Resp: No shortness of breath with exertion or at rest.  No excess mucus, no productive cough,  No non-productive cough,  No coughing up of blood.  No change in color of  mucus.  No wheezing.  No chest wall deformity  Skin: no rash or lesions.  GU: no dysuria, change in color of urine, no urgency or frequency.  No flank pain, no hematuria   MS:  No joint pain or swelling.  No decreased range of motion.  No back pain.    Physical Exam  BP 112/68 (BP Location: Left Arm, Patient Position: Sitting, Cuff Size: Normal)   Pulse 86   Ht 5' 2.5" (1.588 m)   Wt 201 lb 3.2 oz (91.3 kg)   SpO2 97%   BMI 36.21 kg/m   GEN: A/Ox3; pleasant , NAD, elderly    HEENT:  [place]/AT,  EACs-clear, TMs-wnl, NOSE-clear, THROAT-clear, no lesions, no postnasal drip or exudate noted. Class 2 MP  airway , elongated uvula, narrow post pharynx.   NECK:  Supple w/ fair ROM; no JVD; normal carotid impulses w/o bruits; no thyromegaly or nodules palpated; no lymphadenopathy.    RESP  Clear  P & A; w/o, wheezes/ rales/ or rhonchi. no accessory muscle use, no dullness to percussion  CARD:  RRR, no m/r/g, no peripheral edema, pulses intact, no cyanosis or clubbing.  GI:   Soft & nt; nml bowel sounds; no organomegaly or masses detected.   Musco: Warm bil, no deformities or joint swelling noted.   Neuro: alert, no focal deficits noted.    Skin: Warm, no lesions or rashes    Lab Results:  CBC   BNP No results found for: BNP  ProBNP No results found for: PROBNP  Imaging: No results found.   Assessment & Plan:   Obstructive sleep apnea Improved control on CPAP . Encouraged on compliance .  Change to nasal pillows with chin strap to help with comfort   Plan  Patient Instructions  May change to nasal pillows.  Wear CPAP At bedtime   Wear each night for 4hrs  Do not drive if sleepy  Remain active and work on weight loss.  Follow up in 3-4 months and As needed        Obesity, Class II, BMI 35-39.9 Wt loss .      Rexene Edison, NP 10/13/2016

## 2016-10-13 NOTE — Patient Instructions (Signed)
May change to nasal pillows.  Wear CPAP At bedtime   Wear each night for 4hrs  Do not drive if sleepy  Remain active and work on weight loss.  Follow up in 3-4 months and As needed

## 2016-10-13 NOTE — Assessment & Plan Note (Signed)
Improved control on CPAP . Encouraged on compliance .  Change to nasal pillows with chin strap to help with comfort   Plan  Patient Instructions  May change to nasal pillows.  Wear CPAP At bedtime   Wear each night for 4hrs  Do not drive if sleepy  Remain active and work on weight loss.  Follow up in 3-4 months and As needed

## 2016-10-13 NOTE — Assessment & Plan Note (Signed)
Wt loss  

## 2016-11-10 ENCOUNTER — Telehealth: Payer: Self-pay | Admitting: Family Medicine

## 2016-11-10 MED ORDER — MOMETASONE FUROATE 0.1 % EX CREA: 1.0000 "application " | TOPICAL_CREAM | Freq: Every day | CUTANEOUS | 1 refills | Status: DC

## 2016-11-10 NOTE — Telephone Encounter (Signed)
Relation to JO:INOM  Call back number:[phone] Pharmacy: [facility] ([address]), [place] [phone] (Phone) [phone] (Fax)      Reason for call:  Patient requesting refill mometasone (ELOCON) 0.1 % cream, patient states due to the season changing she occurs a body rash and Dr. Birdie Riddle has prescribe in the past,patient requesting Rx, please advise

## 2016-11-10 NOTE — Telephone Encounter (Signed)
Sent in and patient notified 

## 2016-11-10 NOTE — Telephone Encounter (Signed)
Ok to refill x1  2 refill

## 2016-12-11 IMAGING — MG MM SCREENING BREAST TOMO BILATERAL
6 of 9 series · 6 of 21 positions shown · non-contrast
Comparison: Previous exam(s).

CLINICAL DATA: Screening.

EXAM:
DIGITAL SCREENING BILATERAL MAMMOGRAM WITH 3D TOMO WITH CAD

[R CC]
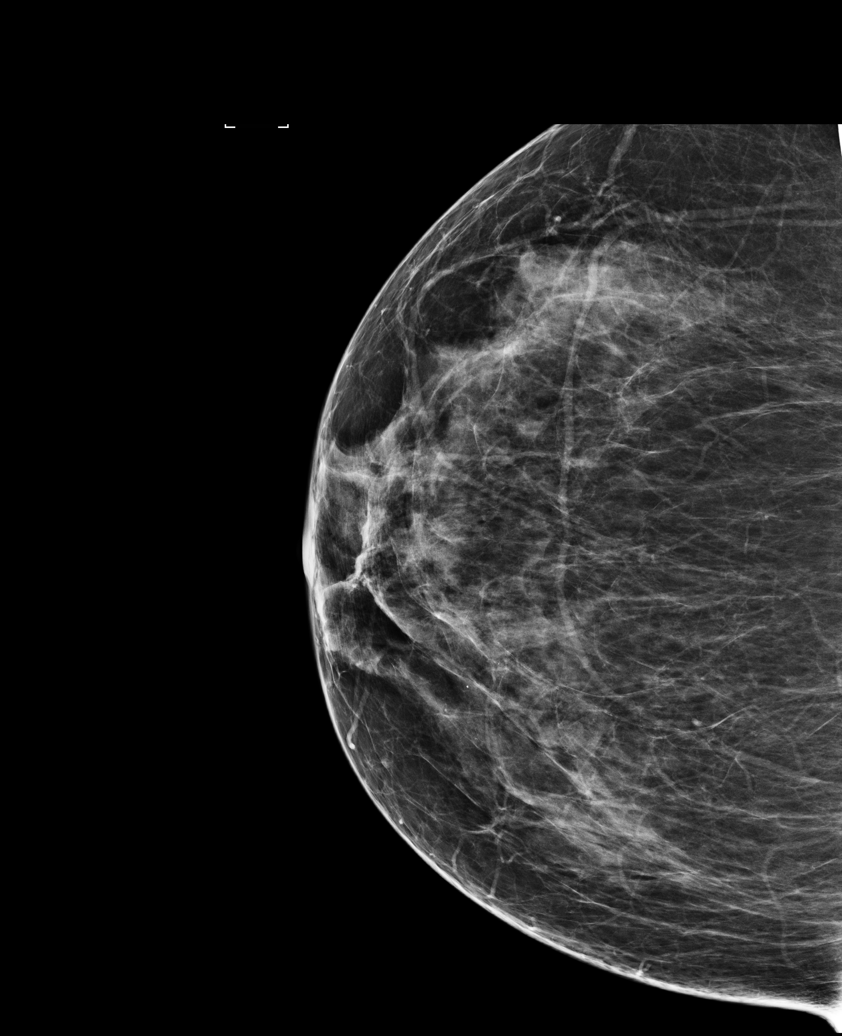

[L CC synth-2D]
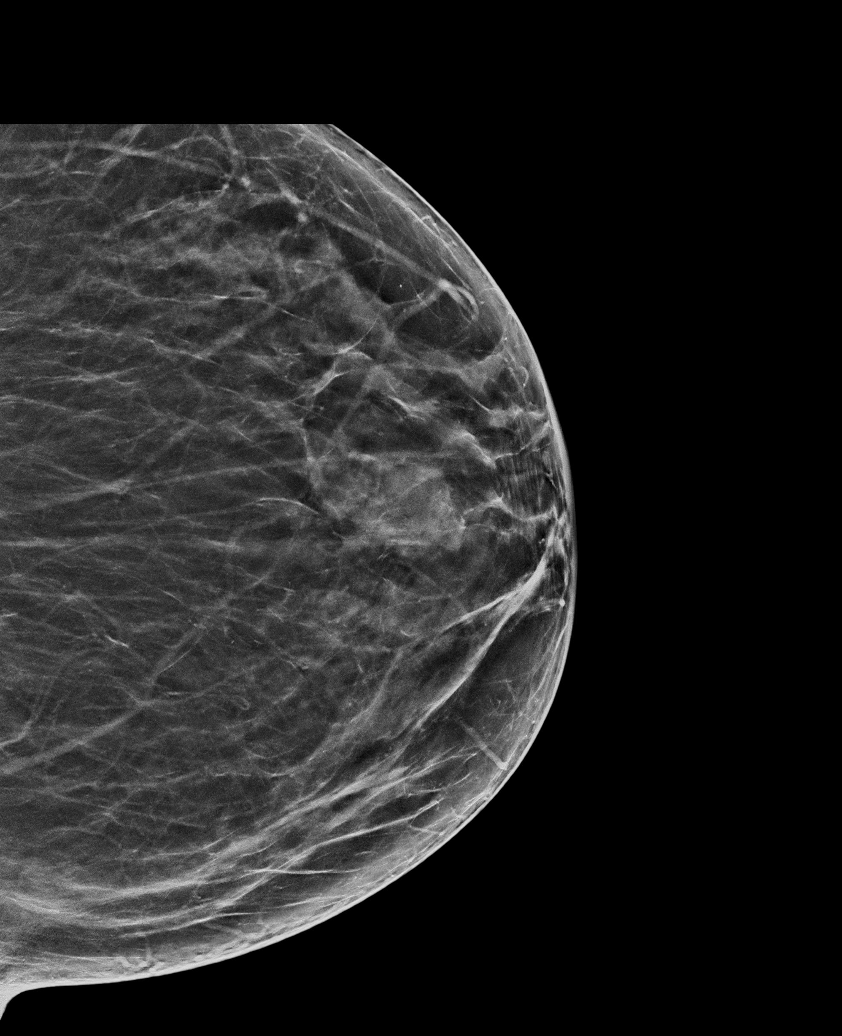

[R MLO]
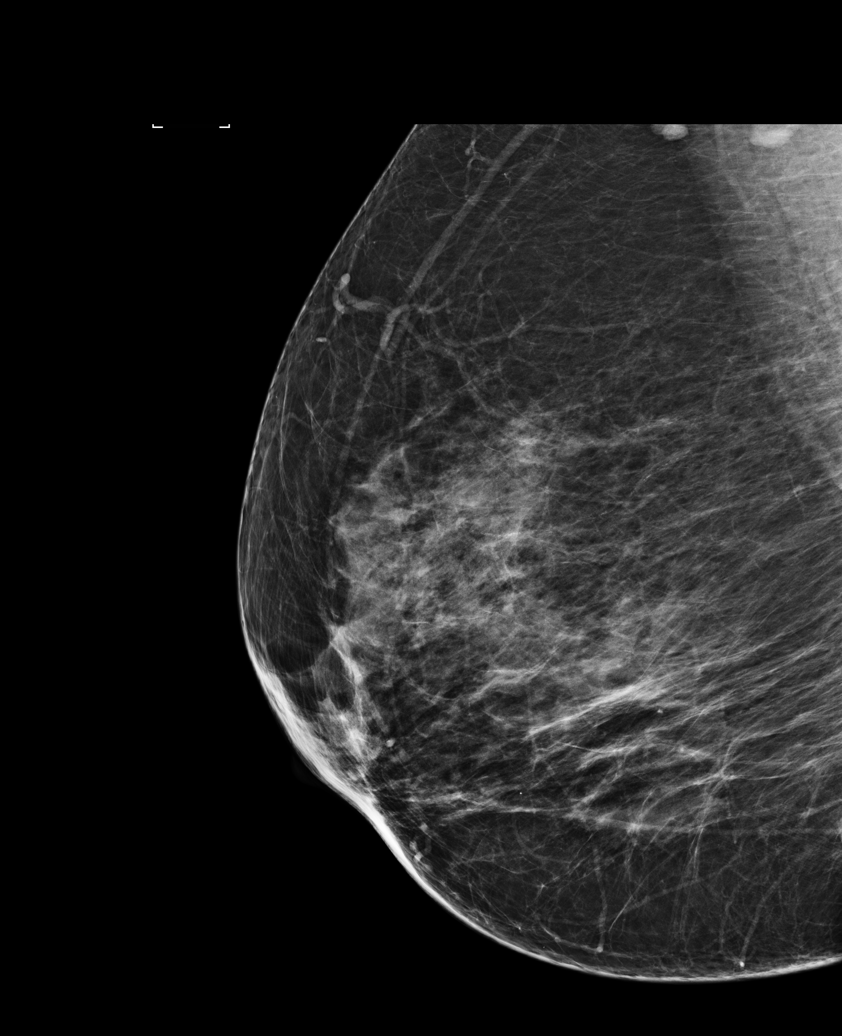

[R CC synth-2D]
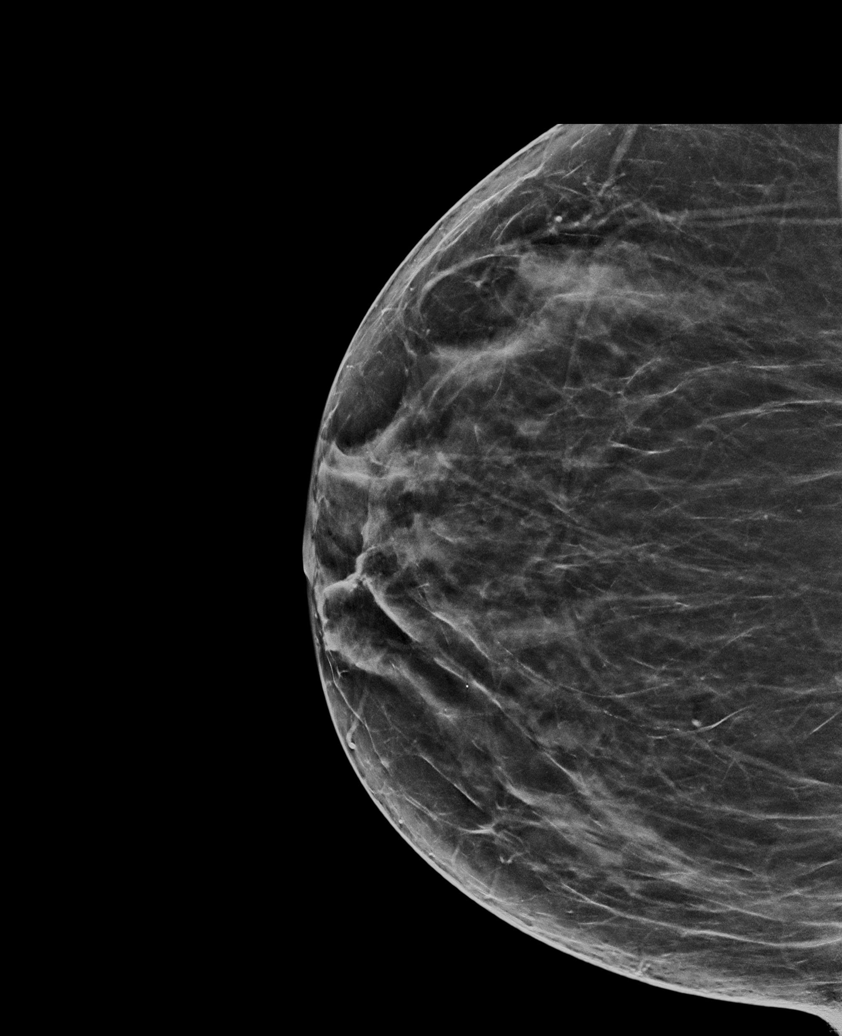

[R MLO synth-2D]
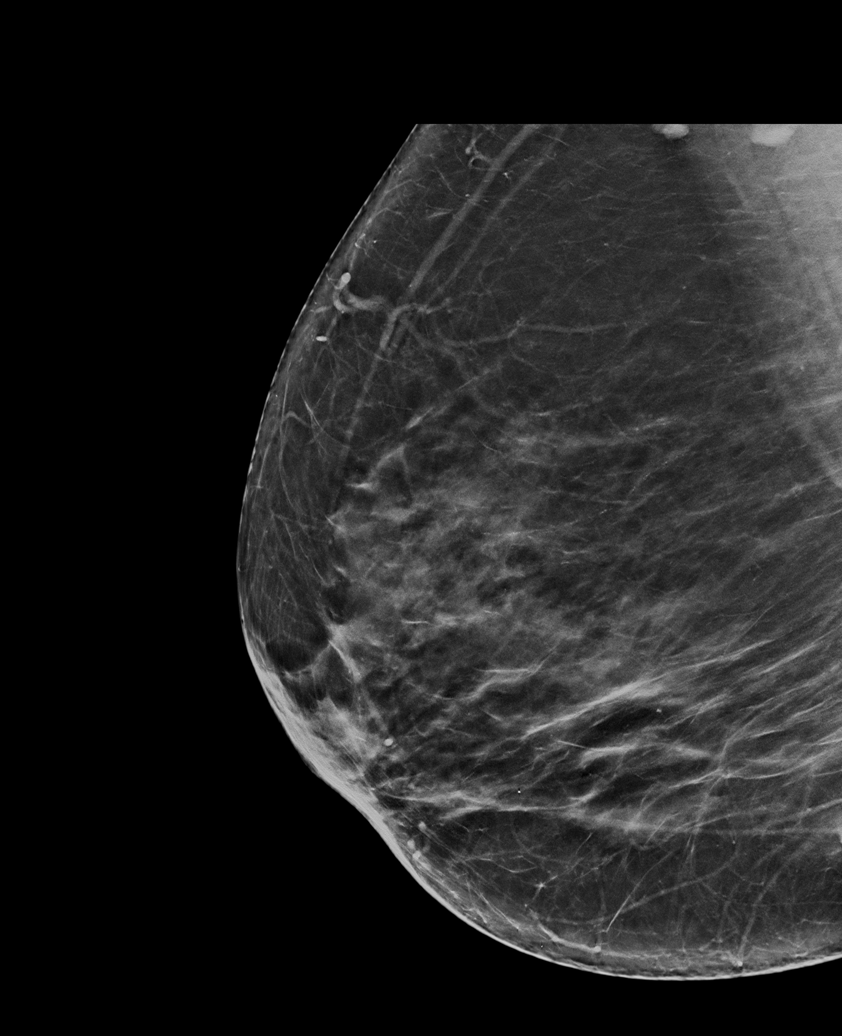

[L MLO]
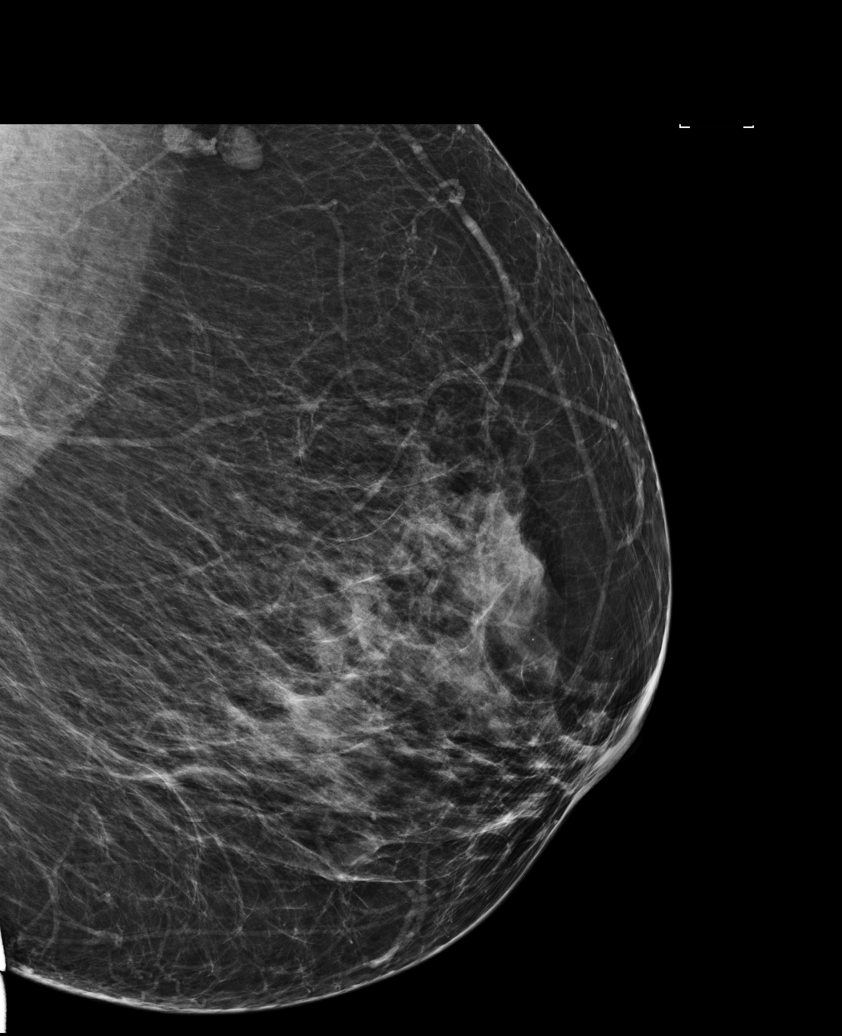

[6 of 21 positions shown; findings below may reference images not displayed]

ACR Breast Density Category b: There are scattered areas of
fibroglandular density.
FINDINGS: There are no findings suspicious for malignancy. Images were
processed with CAD.
IMPRESSION: No mammographic evidence of malignancy. A result letter of this
screening mammogram will be mailed directly to the patient.

RECOMMENDATION:
Screening mammogram in one year. (Code:55-L-23V)

BI-RADS CATEGORY  1: Negative.

## 2017-01-18 ENCOUNTER — Telehealth: Payer: Self-pay | Admitting: Family Medicine

## 2017-01-18 NOTE — Telephone Encounter (Signed)
Called pt to schedule AWV. Lvm for pt to call office to schedule appt.  °

## 2017-01-26 ENCOUNTER — Encounter: Payer: Self-pay | Admitting: Adult Health

## 2017-01-26 ENCOUNTER — Ambulatory Visit (INDEPENDENT_AMBULATORY_CARE_PROVIDER_SITE_OTHER): Payer: Medicare Other | Admitting: Adult Health

## 2017-01-26 DIAGNOSIS — E669 Obesity, unspecified: Secondary | ICD-10-CM | POA: Diagnosis not present

## 2017-01-26 DIAGNOSIS — G4733 Obstructive sleep apnea (adult) (pediatric): Secondary | ICD-10-CM

## 2017-01-26 NOTE — Assessment & Plan Note (Signed)
Wt loss  

## 2017-01-26 NOTE — Assessment & Plan Note (Signed)
Encouraged on compliance   Plan  Patient Instructions  Give the nasal pillows a try with chin strap - call if you would like to try this .  Wear CPAP At bedtime   Wear each night for 4hrs  Do not drive if sleepy  Remain active and work on weight loss.  Follow up in 4 -6 months with Dr. Elsworth Soho  And As needed

## 2017-01-26 NOTE — Progress Notes (Signed)
@Patient  ID: Candice Bennett, female    DOB: [birth year], 71 y.o.   MRN: [id number]  Chief Complaint  Patient presents with  . Follow-up    OSA     Referring provider: [name], *  HPI: 71 yo female followed for Mild OSA   TEST  Sleep study 07/2016 AHI 14/hr    01/26/2017 Follow up : OSA  Patient returns for a four-month follow-up for sleep apnea. Says she continues to struggle with wearing her CPAP . She does not like the work that goes into wearing it and cleaning it. She tries to wear it but forgets sometime. She did not get nasal pillows as recommneded. We discussed the potential compensations from untreated sleep apnea. She says that she will try it again. She is going to try her full facemask again that she doesn't have to buy new mass. It doesn't work, then she'll call back to try the nasal pillows.    Allergies  Allergen Reactions  . Iohexol Itching  . Ivp Dye [Iodinated Diagnostic Agents] Itching    Immunization History  Administered Date(s) Administered  . Influenza Whole 03/26/2009  . Pneumococcal Conjugate-13 01/30/2014  . Pneumococcal Polysaccharide-23 12/09/2012  . Td 08/10/2000  . Tdap 04/07/2011  . Zoster 02/10/2010    Past Medical History:  Diagnosis Date  . Follicular lymphoma ([place])   . OSA (obstructive sleep apnea)   . Sarcoidosis   . Unspecified essential hypertension     Tobacco History: History  Smoking Status  . Never Smoker  Smokeless Tobacco  . Never Used   Counseling given: Not Answered   Outpatient Encounter Prescriptions as of 01/26/2017  Medication Sig  . amLODipine (NORVASC) 5 MG tablet TAKE ONE TABLET BY MOUTH ONCE DAILY  . labetalol (NORMODYNE) 100 MG tablet Take 1 tablet (100 mg total) by mouth 2 (two) times daily.  . mometasone (ELOCON) 0.1 % cream Apply 1 application topically daily.  . [DISCONTINUED] Glucosamine Sulfate 1000 MG CAPS Take 1 capsule by mouth daily.  . [DISCONTINUED] tiZANidine (ZANAFLEX) 4  MG capsule Take 1 capsule (4 mg total) by mouth 3 (three) times daily. (Patient not taking: Reported on 07/03/2016)   No facility-administered encounter medications on file as of 01/26/2017.      Review of Systems  Constitutional:   No  weight loss, night sweats,  Fevers, chills, fatigue, or  lassitude.  HEENT:   No headaches,  Difficulty swallowing,  Tooth/dental problems, or  Sore throat,                No sneezing, itching, ear ache, nasal congestion, post nasal drip,   CV:  No chest pain,  Orthopnea, PND, swelling in lower extremities, anasarca, dizziness, palpitations, syncope.   GI  No heartburn, indigestion, abdominal pain, nausea, vomiting, diarrhea, change in bowel habits, loss of appetite, bloody stools.   Resp: No shortness of breath with exertion or at rest.  No excess mucus, no productive cough,  No non-productive cough,  No coughing up of blood.  No change in color of mucus.  No wheezing.  No chest wall deformity  Skin: no rash or lesions.  GU: no dysuria, change in color of urine, no urgency or frequency.  No flank pain, no hematuria   MS:  No joint pain or swelling.  No decreased range of motion.  No back pain.    Physical Exam  BP 118/64 (BP Location: Left Arm, Cuff Size: Normal)   Pulse 80  Ht 5\' 3"  (1.6 m)   Wt 199 lb 6.4 oz (90.4 kg)   SpO2 99%   BMI 35.32 kg/m   GEN: A/Ox3; pleasant , NAD, obese    HEENT:  [place]/AT,  EACs-clear, TMs-wnl, NOSE-clear, THROAT-clear, no lesions, no postnasal drip or exudate noted. Class 2-3 MP airway   NECK:  Supple w/ fair ROM; no JVD; normal carotid impulses w/o bruits; no thyromegaly or nodules palpated; no lymphadenopathy.    RESP  Clear  P & A; w/o, wheezes/ rales/ or rhonchi. no accessory muscle use, no dullness to percussion  CARD:  RRR, no m/r/g, no peripheral edema, pulses intact, no cyanosis or clubbing.  GI:   Soft & nt; nml bowel sounds; no organomegaly or masses detected.   Musco: Warm bil, no deformities or  joint swelling noted.   Neuro: alert, no focal deficits noted.    Skin: Warm, no lesions or rashes    Lab Results:  CBC  BMET  BNP No results found for: BNP  ProBNP No results found for: PROBNP  Imaging: No results found.   Assessment & Plan:   Obstructive sleep apnea Encouraged on compliance   Plan  Patient Instructions  Give the nasal pillows a try with chin strap - call if you would like to try this .  Wear CPAP At bedtime   Wear each night for 4hrs  Do not drive if sleepy  Remain active and work on weight loss.  Follow up in 4 -6 months with Dr. Elsworth Soho  And As needed         Obesity, Class II, BMI 35-39.9 Wt loss .      Rexene Edison, NP 01/26/2017

## 2017-01-26 NOTE — Patient Instructions (Addendum)
Give the nasal pillows a try with chin strap - call if you would like to try this .  Wear CPAP At bedtime   Wear each night for 4hrs  Do not drive if sleepy  Remain active and work on weight loss.  Follow up in 4 -6 months with Dr. Elsworth Soho  And As needed

## 2017-02-22 ENCOUNTER — Other Ambulatory Visit: Payer: Self-pay | Admitting: Family Medicine

## 2017-02-22 DIAGNOSIS — I10 Essential (primary) hypertension: Secondary | ICD-10-CM

## 2017-02-23 ENCOUNTER — Telehealth: Payer: Self-pay | Admitting: Family Medicine

## 2017-02-24 NOTE — Telephone Encounter (Signed)
Pt called in to follow up on refill request. Pt says that she is completely out of her medication.  ° ° °Please assist further.  °

## 2017-02-25 ENCOUNTER — Encounter: Payer: Self-pay | Admitting: Family Medicine

## 2017-02-25 ENCOUNTER — Ambulatory Visit (INDEPENDENT_AMBULATORY_CARE_PROVIDER_SITE_OTHER): Payer: Medicare Other | Admitting: Family Medicine

## 2017-02-25 VITALS — BP 104/70 | HR 78 | Temp 98.1°F | Ht 63.0 in | Wt 200.8 lb

## 2017-02-25 DIAGNOSIS — M545 Low back pain: Secondary | ICD-10-CM | POA: Diagnosis not present

## 2017-02-25 DIAGNOSIS — I10 Essential (primary) hypertension: Secondary | ICD-10-CM

## 2017-02-25 DIAGNOSIS — Z23 Encounter for immunization: Secondary | ICD-10-CM

## 2017-02-25 LAB — LIPID PANEL
Cholesterol: 191 mg/dL (ref 0–200)
HDL: 60.2 mg/dL (ref >39.00)
LDL Cholesterol: 115 mg/dL — ABNORMAL HIGH (ref 0–99)
NONHDL: 130.53
TRIGLYCERIDES: 79 mg/dL (ref 0.0–149.0)
Total CHOL/HDL Ratio: 3
VLDL: 15.8 mg/dL (ref 0.0–40.0)

## 2017-02-25 LAB — COMPREHENSIVE METABOLIC PANEL
ALK PHOS: 59 U/L (ref 39–117)
ALT: 19 U/L (ref 0–35)
AST: 20 U/L (ref 0–37)
Albumin: 4.2 g/dL (ref 3.5–5.2)
BILIRUBIN TOTAL: 0.5 mg/dL (ref 0.2–1.2)
BUN: 12 mg/dL (ref 6–23)
CALCIUM: 9.5 mg/dL (ref 8.4–10.5)
CO2: 28 mEq/L (ref 19–32)
CREATININE: 0.76 mg/dL (ref 0.40–1.20)
Chloride: 104 mEq/L (ref 96–112)
GFR: 96.89 mL/min (ref >60.00)
GLUCOSE: 101 mg/dL — AB (ref 70–99)
Potassium: 4 mEq/L (ref 3.5–5.1)
Sodium: 140 mEq/L (ref 135–145)
TOTAL PROTEIN: 7.7 g/dL (ref 6.0–8.3)

## 2017-02-25 LAB — POCT URINALYSIS DIPSTICK
BILIRUBIN UA: NEGATIVE
Glucose, UA: NEGATIVE
Ketones, UA: NEGATIVE
Leukocytes, UA: NEGATIVE
NITRITE UA: NEGATIVE
PH UA: 6 (ref 5.0–8.0)
Protein, UA: NEGATIVE
RBC UA: NEGATIVE
Spec Grav, UA: >=1.03 — AB (ref 1.010–1.025)
UROBILINOGEN UA: 0.2 U/dL

## 2017-02-25 NOTE — Assessment & Plan Note (Signed)
Tylenol Ice/ heat Stretching rto prn

## 2017-02-25 NOTE — Patient Instructions (Signed)

## 2017-02-25 NOTE — Progress Notes (Signed)
Patient ID: [name], female    DOB: [birth year]  Age: 71 y.o. MRN: [id number]    Subjective:  Subjective  HPI Candice Bennett presents for f/u bp and back pain.  She is concerned about her kidneys No new injury.  Pt was dx arthritic changes at cpe   Review of Systems  Constitutional: Negative for activity change, appetite change, fatigue and unexpected weight change.  Respiratory: Negative for cough and shortness of breath.   Cardiovascular: Negative for chest pain and palpitations.  Gastrointestinal: Negative for blood in stool, constipation, diarrhea and nausea.  Genitourinary: Negative for dysuria, flank pain, frequency and hematuria.  Musculoskeletal: Positive for back pain.  Psychiatric/Behavioral: Negative for behavioral problems and dysphoric mood. The patient is not nervous/anxious.     History Past Medical History:  Diagnosis Date  . Follicular lymphoma ([place])   . OSA (obstructive sleep apnea)   . Sarcoidosis   . Unspecified essential hypertension     She has a past surgical history that includes Carpal tunnel release; Tubal ligation (1978); Lymph node biopsy; Abdominal hysterectomy (1983); port a cath insertion (2005); and Port-a-cath removal (2006).   Her family history includes Bell's palsy in her sister; Colon cancer in her brother; Diabetes in her brother and sister; Heart disease in her mother; Heart disease (age of onset: 66) in her sister; Hypertension in her sister; Lung cancer in her father; Multiple myeloma in her sister.She reports that she has never smoked. She has never used smokeless tobacco. She reports that she does not drink alcohol or use drugs.  Current Outpatient Prescriptions on File Prior to Visit  Medication Sig Dispense Refill  . amLODipine (NORVASC) 5 MG tablet TAKE 1 TABLET BY MOUTH ONCE DAILY 90 tablet 1  . labetalol (NORMODYNE) 100 MG tablet TAKE ONE TABLET BY MOUTH TWICE DAILY 60 tablet 5  . mometasone (ELOCON) 0.1 % cream Apply 1  application topically daily. 45 g 1   No current facility-administered medications on file prior to visit.      Objective:  Objective  Physical Exam  Constitutional: She is oriented to person, place, and time. She appears well-developed and well-nourished.  HENT:  Head: Normocephalic and atraumatic.  Eyes: Conjunctivae and EOM are normal.  Neck: Normal range of motion. Neck supple. No JVD present. Carotid bruit is not present. No thyromegaly present.  Cardiovascular: Normal rate, regular rhythm and normal heart sounds.   No murmur heard. Pulmonary/Chest: Effort normal and breath sounds normal. No respiratory distress. She has no wheezes. She has no rales. She exhibits no tenderness.  Musculoskeletal: She exhibits no edema.  Neurological: She is alert and oriented to person, place, and time.  Psychiatric: She has a normal mood and affect. Her behavior is normal. Judgment and thought content normal.  Nursing note and vitals reviewed.  BP 104/70 (BP Location: Right Arm, Patient Position: Sitting, Cuff Size: Normal)   Pulse 78   Temp 98.1 F (36.7 C) (Oral)   Ht 5' 3"  (1.6 m)   Wt 200 lb 12.8 oz (91.1 kg)   SpO2 97%   BMI 35.57 kg/m  Wt Readings from Last 3 Encounters:  02/25/17 200 lb 12.8 oz (91.1 kg)  01/26/17 199 lb 6.4 oz (90.4 kg)  10/13/16 201 lb 3.2 oz (91.3 kg)     Lab Results  Component Value Date   WBC 6.1 08/13/2016   HGB 12.3 08/13/2016   HCT 38.3 08/13/2016   PLT 353.0 08/13/2016   GLUCOSE 101 (H) 02/25/2017  CHOL 191 02/25/2017   TRIG 79.0 02/25/2017   HDL 60.20 02/25/2017   LDLCALC 115 (H) 02/25/2017   ALT 19 02/25/2017   AST 20 02/25/2017   NA 140 02/25/2017   K 4.0 02/25/2017   CL 104 02/25/2017   CREATININE 0.76 02/25/2017   BUN 12 02/25/2017   CO2 28 02/25/2017   TSH 0.86 04/07/2011   MICROALBUR 0.9 08/08/2015    Dg Lumbar Spine Complete  Result Date: 05/08/2016 CLINICAL DATA:  Acute left-sided low back pain with sciatic symptoms on the  left for the past 2-3 weeks greatest at night. No known injury. EXAM: LUMBAR SPINE - COMPLETE 4+ VIEW COMPARISON:  Coronal and sagittal images from a CT scan of March 10, 2011 FINDINGS: The lumbar vertebral bodies are preserved in height. The pedicles and transverse processes are intact. There is mild disc space narrowing at L4-5 and minimal disc space narrowing at L3-4 and at L2-3. There is no spondylolisthesis. There is facet joint hypertrophy at L4-5 and at L5-S1. The observed portions of the sacrum are normal. There is abdominal aortic atherosclerosis. 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 No compression fracture nor other acute abnormality. No dramatic change since the CT images of September 2012. Abdominal aortic atherosclerosis. Electronically Signed   By: David  Martinique M.D.   On: 05/08/2016 13:34     Assessment & Plan:  Plan  I am having Candice Bennett maintain her mometasone, amLODipine, and labetalol.  No orders of the defined types were placed in this encounter.   Problem List Items Addressed This Visit      Unprioritized   Essential hypertension    Well controlled, no changes to meds. Encouraged heart healthy diet such as the DASH diet and exercise as tolerated.       Relevant Orders   Lipid panel (Completed)   Comprehensive metabolic panel (Completed)   Low back pain - Primary    Tylenol Ice/ heat Stretching rto prn      Relevant Orders   POCT urinalysis dipstick (Completed)    Other Visit Diagnoses    Need for prophylactic vaccination and inoculation against influenza       Relevant Orders   Flu vaccine HIGH DOSE PF (Fluzone High dose) (Completed)      Follow-up: Return in about 6 months (around 08/25/2017), or if symptoms worsen or fail to improve.  [name], DO

## 2017-02-25 NOTE — Assessment & Plan Note (Signed)
Well controlled, no changes to meds. Encouraged heart healthy diet such as the DASH diet and exercise as tolerated.  °

## 2017-03-17 NOTE — Telephone Encounter (Signed)
error 

## 2017-05-06 NOTE — Progress Notes (Signed)
Subjective:   Candice Bennett is a 71 y.o. female who presents for Medicare Annual (Subsequent) preventive examination.  Review of Systems:  No ROS.  Medicare Wellness Visit. Additional risk factors are reflected in the social history. Cardiac Risk Factors include: advanced age (>28mn, >>2women);hypertension Sleep patterns: Sleep varies. Pt states she likes to stay up late. States she is not wearing her cpap. Takes Benadryl occasionally to help her sleep.  Female:   Pap- last 12/09/12-normal      Mammo- Last 08/23/15: Normal      Dexa scan-   Ordered     CCS- last 08/20/11: recall 161yr    Objective:     Vitals: BP 126/78 (BP Location: Left Arm, Patient Position: Sitting, Cuff Size: Normal)   Pulse 75   Ht 5' 3"  (1.6 m)   Wt 203 lb 3.2 oz (92.2 kg)   SpO2 99%   BMI 36.00 kg/m   Body mass index is 36 kg/m.   Tobacco Social History   Tobacco Use  Smoking Status Never Smoker  Smokeless Tobacco Never Used     Counseling given: Not Answered   Past Medical History:  Diagnosis Date  . Follicular lymphoma ([place]  . OSA (obstructive sleep apnea)   . Sarcoidosis   . Unspecified essential hypertension    Past Surgical History:  Procedure Laterality Date  . ABDOMINAL HYSTERECTOMY  1983  . CARPAL TUNNEL RELEASE    . LYMPH NODE BIOPSY    . port a cath insertion  2005  . PORT-A-CATH REMOVAL  2006  . TUBAL LIGATION  1978   Family History  Problem Relation Age of Onset  . Lung cancer Father   . Diabetes Brother   . Colon cancer Brother   . Heart disease Mother   . Heart disease Sister [id number]     chf  . Hypertension Sister   . Multiple myeloma Sister   . Bell's palsy Sister   . Diabetes Sister    Social History   Substance and Sexual Activity  Sexual Activity Not Currently  . Partners: Male    Outpatient Encounter Medications as of 05/10/2017  Medication Sig  . amLODipine (NORVASC) 5 MG tablet TAKE 1 TABLET BY MOUTH ONCE DAILY  . labetalol (NORMODYNE) 100  MG tablet TAKE ONE TABLET BY MOUTH TWICE DAILY  . mometasone (ELOCON) 0.1 % cream Apply 1 application topically daily.   No facility-administered encounter medications on file as of 05/10/2017.     Activities of Daily Living In your present state of health, do you have any difficulty performing the following activities: 05/10/2017  Hearing? N  Vision? N  Comment wearing glasses. Pt states she is past due for eye exam and will schedule.  Difficulty concentrating or making decisions? N  Walking or climbing stairs? N  Dressing or bathing? N  Doing errands, shopping? N  Preparing Food and eating ? N  Using the Toilet? N  In the past six months, have you accidently leaked urine? N  Do you have problems with loss of bowel control? N  Managing your Medications? N  Managing your Finances? N  Housekeeping or managing your Housekeeping? N  Some recent data might be hidden    Patient Care Team: LoCarollee HerterYvAlferd ApaDO as PCP - General KaInda CastleMD as Consulting Physician (Gastroenterology)    Assessment:    Physical assessment deferred to PCP.  Exercise Activities and Dietary recommendations Current Exercise Habits: The patient  does not participate in regular exercise at present, Exercise limited by: None identified Diet (meal preparation, eat out, water intake, caffeinated beverages, dairy products, fruits and vegetables): in general, an "unhealthy" diet  24 hr recall Breakfast: eggs, sausage, bacon, biscuit, oj Lunch: egg nog and cake Dinner: potpie More egg nog and sweet treat  Goals    . Drink 4 glasses of water per day (pt-stated)    . Exercise 2x per week (30 min per time)    . Limits sweets to 2 per day (pt-stated)    . Weight (lb) < 170 lb (77.1 kg)      Fall Risk Fall Risk  05/10/2017 05/08/2016 08/08/2015 02/01/2015 02/01/2015  Falls in the past year? No No No No No  Number falls in past yr: - - - - -  Risk for fall due to : - - - - -  Risk for fall due to:  Comment - - - - -   Depression Screen PHQ 2/9 Scores 05/10/2017 05/08/2016 08/08/2015 02/01/2015  PHQ - 2 Score 0 0 0 0     Cognitive Function MMSE - Mini Mental State Exam 05/10/2017 05/08/2016  Orientation to time 5 5  Orientation to Place 5 5  Registration 3 3  Attention/ Calculation 5 5  Recall 3 3  Language- name 2 objects 2 2  Language- repeat 1 1  Language- follow 3 step command 3 3  Language- read & follow direction 1 1  Write a sentence 1 1  Copy design 1 1  Total score 30 30        Immunization History  Administered Date(s) Administered  . Influenza Whole 03/26/2009  . Influenza, High Dose Seasonal PF 02/25/2017  . Pneumococcal Conjugate-13 01/30/2014  . Pneumococcal Polysaccharide-23 12/09/2012  . Td 08/10/2000  . Tdap 04/07/2011  . Zoster 02/10/2010   Screening Tests Health Maintenance  Topic Date Due  . MAMMOGRAM  08/22/2017  . TETANUS/TDAP  04/06/2021  . COLONOSCOPY  08/19/2021  . INFLUENZA VACCINE  Completed  . DEXA SCAN  Completed  . Hepatitis C Screening  Completed  . PNA vac Low Risk Adult  Completed      Plan:   Follow up with Dr.Lowne as scheduled 08/27/17.   Continue to eat heart healthy diet (full of fruits, vegetables, whole grains, lean protein, water--limit salt, fat, and sugar intake) and increase physical activity as tolerated.  Continue doing brain stimulating activities (puzzles, reading, adult coloring books, staying active) to keep memory sharp.    I have personally reviewed and noted the following in the patient's chart:   . Medical and social history . Use of alcohol, tobacco or illicit drugs  . Current medications and supplements . Functional ability and status . Nutritional status . Physical activity . Advanced directives . List of other physicians . Hospitalizations, surgeries, and ER visits in previous 12 months . Vitals . Screenings to include cognitive, depression, and falls . Referrals and appointments  In  addition, I have reviewed and discussed with patient certain preventive protocols, quality metrics, and best practice recommendations. A written personalized care plan for preventive services as well as general preventive health recommendations were provided to patient.     Shela Nevin, [place]  05/10/2017

## 2017-05-10 ENCOUNTER — Encounter: Payer: Self-pay | Admitting: *Deleted

## 2017-05-10 ENCOUNTER — Ambulatory Visit (INDEPENDENT_AMBULATORY_CARE_PROVIDER_SITE_OTHER): Payer: Medicare Other | Admitting: *Deleted

## 2017-05-10 VITALS — BP 126/78 | HR 75 | Ht 63.0 in | Wt 203.2 lb

## 2017-05-10 DIAGNOSIS — Z0000 Encounter for general adult medical examination without abnormal findings: Secondary | ICD-10-CM

## 2017-05-10 NOTE — Patient Instructions (Signed)
Follow up with Dr.Lowne as scheduled 08/27/17.   Continue to eat heart healthy diet (full of fruits, vegetables, whole grains, lean protein, water--limit salt, fat, and sugar intake) and increase physical activity as tolerated.  Continue doing brain stimulating activities (puzzles, reading, adult coloring books, staying active) to keep memory sharp.    Candice Bennett , Thank you for taking time to come for your Medicare Wellness Visit. I appreciate your ongoing commitment to your health goals. Please review the following plan we discussed and let me know if I can assist you in the future.   These are the goals we discussed: Goals    . Drink 4 glasses of water per day (pt-stated)    . Exercise 2x per week (30 min per time)    . Limits sweets to 2 per day (pt-stated)    . Weight (lb) < 170 lb (77.1 kg)       This is a list of the screening recommended for you and due dates:  Health Maintenance  Topic Date Due  . Mammogram  08/22/2017  . Tetanus Vaccine  04/06/2021  . Colon Cancer Screening  08/19/2021  . Flu Shot  Completed  . DEXA scan (bone density measurement)  Completed  .  Hepatitis C: One time screening is recommended by Center for Disease Control  (CDC) for  adults born from [birth year] through 1965.   Completed  . Pneumonia vaccines  Completed    Health Maintenance for Postmenopausal Women Menopause is a normal process in which your reproductive ability comes to an end. This process happens gradually over a span of months to years, usually between the ages of 50 and 28. Menopause is complete when you have missed 12 consecutive menstrual periods. It is important to talk with your health care provider about some of the most common conditions that affect postmenopausal women, such as heart disease, cancer, and bone loss (osteoporosis). Adopting a healthy lifestyle and getting preventive care can help to promote your health and wellness. Those actions can also lower your chances of developing  some of these common conditions. What should I know about menopause? During menopause, you may experience a number of symptoms, such as:  Moderate-to-severe hot flashes.  Night sweats.  Decrease in sex drive.  Mood swings.  Headaches.  Tiredness.  Irritability.  Memory problems.  Insomnia.  Choosing to treat or not to treat menopausal changes is an individual decision that you make with your health care provider. What should I know about hormone replacement therapy and supplements? Hormone therapy products are effective for treating symptoms that are associated with menopause, such as hot flashes and night sweats. Hormone replacement carries certain risks, especially as you become older. If you are thinking about using estrogen or estrogen with progestin treatments, discuss the benefits and risks with your health care provider. What should I know about heart disease and stroke? Heart disease, heart attack, and stroke become more likely as you age. This may be due, in part, to the hormonal changes that your body experiences during menopause. These can affect how your body processes dietary fats, triglycerides, and cholesterol. Heart attack and stroke are both medical emergencies. There are many things that you can do to help prevent heart disease and stroke:  Have your blood pressure checked at least every 1-2 years. High blood pressure causes heart disease and increases the risk of stroke.  If you are 2-24 years old, ask your health care provider if you should take aspirin to prevent  a heart attack or a stroke.  Do not use any tobacco products, including cigarettes, chewing tobacco, or electronic cigarettes. If you need help quitting, ask your health care provider.  It is important to eat a healthy diet and maintain a healthy weight. ? Be sure to include plenty of vegetables, fruits, low-fat dairy products, and lean protein. ? Avoid eating foods that are high in solid fats,  added sugars, or salt (sodium).  Get regular exercise. This is one of the most important things that you can do for your health. ? Try to exercise for at least 150 minutes each week. The type of exercise that you do should increase your heart rate and make you sweat. This is known as moderate-intensity exercise. ? Try to do strengthening exercises at least twice each week. Do these in addition to the moderate-intensity exercise.  Know your numbers.Ask your health care provider to check your cholesterol and your blood glucose. Continue to have your blood tested as directed by your health care provider.  What should I know about cancer screening? There are several types of cancer. Take the following steps to reduce your risk and to catch any cancer development as early as possible. Breast Cancer  Practice breast self-awareness. ? This means understanding how your breasts normally appear and feel. ? It also means doing regular breast self-exams. Let your health care provider know about any changes, no matter how small.  If you are 27 or older, have a clinician do a breast exam (clinical breast exam or CBE) every year. Depending on your age, family history, and medical history, it may be recommended that you also have a yearly breast X-ray (mammogram).  If you have a family history of breast cancer, talk with your health care provider about genetic screening.  If you are at high risk for breast cancer, talk with your health care provider about having an MRI and a mammogram every year.  Breast cancer (BRCA) gene test is recommended for women who have family members with BRCA-related cancers. Results of the assessment will determine the need for genetic counseling and BRCA1 and for BRCA2 testing. BRCA-related cancers include these types: ? Breast. This occurs in males or females. ? Ovarian. ? Tubal. This may also be called fallopian tube cancer. ? Cancer of the abdominal or pelvic lining  (peritoneal cancer). ? Prostate. ? Pancreatic.  Cervical, Uterine, and Ovarian Cancer Your health care provider may recommend that you be screened regularly for cancer of the pelvic organs. These include your ovaries, uterus, and vagina. This screening involves a pelvic exam, which includes checking for microscopic changes to the surface of your cervix (Pap test).  For women ages 21-65, health care providers may recommend a pelvic exam and a Pap test every three years. For women ages 46-65, they may recommend the Pap test and pelvic exam, combined with testing for human papilloma virus (HPV), every five years. Some types of HPV increase your risk of cervical cancer. Testing for HPV may also be done on women of any age who have unclear Pap test results.  Other health care providers may not recommend any screening for nonpregnant women who are considered low risk for pelvic cancer and have no symptoms. Ask your health care provider if a screening pelvic exam is right for you.  If you have had past treatment for cervical cancer or a condition that could lead to cancer, you need Pap tests and screening for cancer for at least 20 years after your  treatment. If Pap tests have been discontinued for you, your risk factors (such as having a new sexual partner) need to be reassessed to determine if you should start having screenings again. Some women have medical problems that increase the chance of getting cervical cancer. In these cases, your health care provider may recommend that you have screening and Pap tests more often.  If you have a family history of uterine cancer or ovarian cancer, talk with your health care provider about genetic screening.  If you have vaginal bleeding after reaching menopause, tell your health care provider.  There are currently no reliable tests available to screen for ovarian cancer.  Lung Cancer Lung cancer screening is recommended for adults 39-86 years old who are at  high risk for lung cancer because of a history of smoking. A yearly low-dose CT scan of the lungs is recommended if you:  Currently smoke.  Have a history of at least 30 pack-years of smoking and you currently smoke or have quit within the past 15 years. A pack-year is smoking an average of one pack of cigarettes per day for one year.  Yearly screening should:  Continue until it has been 15 years since you quit.  Stop if you develop a health problem that would prevent you from having lung cancer treatment.  Colorectal Cancer  This type of cancer can be detected and can often be prevented.  Routine colorectal cancer screening usually begins at age 82 and continues through age 63.  If you have risk factors for colon cancer, your health care provider may recommend that you be screened at an earlier age.  If you have a family history of colorectal cancer, talk with your health care provider about genetic screening.  Your health care provider may also recommend using home test kits to check for hidden blood in your stool.  A small camera at the end of a tube can be used to examine your colon directly (sigmoidoscopy or colonoscopy). This is done to check for the earliest forms of colorectal cancer.  Direct examination of the colon should be repeated every 5-10 years until age 9. However, if early forms of precancerous polyps or small growths are found or if you have a family history or genetic risk for colorectal cancer, you may need to be screened more often.  Skin Cancer  Check your skin from head to toe regularly.  Monitor any moles. Be sure to tell your health care provider: ? About any new moles or changes in moles, especially if there is a change in a mole's shape or color. ? If you have a mole that is larger than the size of a pencil eraser.  If any of your family members has a history of skin cancer, especially at a young age, talk with your health care provider about genetic  screening.  Always use sunscreen. Apply sunscreen liberally and repeatedly throughout the day.  Whenever you are outside, protect yourself by wearing long sleeves, pants, a wide-brimmed hat, and sunglasses.  What should I know about osteoporosis? Osteoporosis is a condition in which bone destruction happens more quickly than new bone creation. After menopause, you may be at an increased risk for osteoporosis. To help prevent osteoporosis or the bone fractures that can happen because of osteoporosis, the following is recommended:  If you are 42-18 years old, get at least 1,000 mg of calcium and at least 600 mg of vitamin D per day.  If you are older than age  66 but younger than age 110, get at least 1,200 mg of calcium and at least 600 mg of vitamin D per day.  If you are older than age 36, get at least 1,200 mg of calcium and at least 800 mg of vitamin D per day.  Smoking and excessive alcohol intake increase the risk of osteoporosis. Eat foods that are rich in calcium and vitamin D, and do weight-bearing exercises several times each week as directed by your health care provider. What should I know about how menopause affects my mental health? Depression may occur at any age, but it is more common as you become older. Common symptoms of depression include:  Low or sad mood.  Changes in sleep patterns.  Changes in appetite or eating patterns.  Feeling an overall lack of motivation or enjoyment of activities that you previously enjoyed.  Frequent crying spells.  Talk with your health care provider if you think that you are experiencing depression. What should I know about immunizations? It is important that you get and maintain your immunizations. These include:  Tetanus, diphtheria, and pertussis (Tdap) booster vaccine.  Influenza every year before the flu season begins.  Pneumonia vaccine.  Shingles vaccine.  Your health care provider may also recommend other  immunizations. This information is not intended to replace advice given to you by your health care provider. Make sure you discuss any questions you have with your health care provider. Document Released: 07/31/2005 Document Revised: 12/27/2015 Document Reviewed: 03/12/2015 Elsevier Interactive Patient Education  2018 [employer].

## 2017-07-09 ENCOUNTER — Encounter: Payer: Self-pay | Admitting: Medical

## 2017-07-09 ENCOUNTER — Ambulatory Visit (HOSPITAL_BASED_OUTPATIENT_CLINIC_OR_DEPARTMENT_OTHER)
Admission: RE | Admit: 2017-07-09 | Discharge: 2017-07-09 | Disposition: A | Discharge status: Home / Self Care | Payer: Medicare Other | Source: Ambulatory Visit | Attending: Medical | Admitting: Medical

## 2017-07-09 ENCOUNTER — Ambulatory Visit (INDEPENDENT_AMBULATORY_CARE_PROVIDER_SITE_OTHER): Payer: Medicare Other | Admitting: Medical

## 2017-07-09 VITALS — BP 129/70 | HR 88 | Temp 102.0°F | Ht 63.0 in | Wt 199.0 lb

## 2017-07-09 DIAGNOSIS — J40 Bronchitis, not specified as acute or chronic: Secondary | ICD-10-CM

## 2017-07-09 DIAGNOSIS — J029 Acute pharyngitis, unspecified: Secondary | ICD-10-CM | POA: Diagnosis not present

## 2017-07-09 DIAGNOSIS — R509 Fever, unspecified: Secondary | ICD-10-CM

## 2017-07-09 DIAGNOSIS — R52 Pain, unspecified: Secondary | ICD-10-CM | POA: Diagnosis not present

## 2017-07-09 DIAGNOSIS — J111 Influenza due to unidentified influenza virus with other respiratory manifestations: Secondary | ICD-10-CM | POA: Diagnosis not present

## 2017-07-09 DIAGNOSIS — R05 Cough: Secondary | ICD-10-CM | POA: Diagnosis not present

## 2017-07-09 LAB — CBC WITH DIFFERENTIAL/PLATELET
BASOS ABS: 0 10*3/uL (ref 0.0–0.1)
Basophils Relative: 0.5 % (ref 0.0–3.0)
EOS PCT: 0.2 % (ref 0.0–5.0)
Eosinophils Absolute: 0 10*3/uL (ref 0.0–0.7)
HCT: 38.7 % (ref 36.0–46.0)
Hemoglobin: 12.3 g/dL (ref 12.0–15.0)
LYMPHS ABS: 2.2 10*3/uL (ref 0.7–4.0)
Lymphocytes Relative: 21.6 % (ref 12.0–46.0)
MCHC: 31.9 g/dL (ref 30.0–36.0)
MCV: 80.5 fl (ref 78.0–100.0)
MONOS PCT: 5.7 % (ref 3.0–12.0)
Monocytes Absolute: 0.6 10*3/uL (ref 0.1–1.0)
NEUTROS ABS: 7.5 10*3/uL (ref 1.4–7.7)
NEUTROS PCT: 72 % (ref 43.0–77.0)
PLATELETS: 278 10*3/uL (ref 150.0–400.0)
RBC: 4.81 Mil/uL (ref 3.87–5.11)
RDW: 14.4 % (ref 11.5–15.5)
WBC: 10.4 10*3/uL (ref 4.0–10.5)

## 2017-07-09 LAB — POCT INFLUENZA A/B
INFLUENZA A, POC: NEGATIVE
Influenza B, POC: NEGATIVE

## 2017-07-09 LAB — POCT RAPID STREP A (OFFICE): Rapid Strep A Screen: NEGATIVE

## 2017-07-09 MED ORDER — HYDROCODONE-HOMATROPINE 5-1.5 MG/5ML PO SYRP: 5.0000 mL | ORAL_SOLUTION | Freq: Three times a day (TID) | ORAL | 0 refills | Status: DC | PRN

## 2017-07-09 MED ORDER — OSELTAMIVIR PHOSPHATE 75 MG PO CAPS: 75.0000 mg | ORAL_CAPSULE | Freq: Two times a day (BID) | ORAL | 0 refills | Status: DC

## 2017-07-09 MED ORDER — ALBUTEROL SULFATE HFA 108 (90 BASE) MCG/ACT IN AERS: 2.0000 | INHALATION_SPRAY | Freq: Four times a day (QID) | RESPIRATORY_TRACT | 2 refills | Status: DC | PRN

## 2017-07-09 MED ORDER — DOXYCYCLINE HYCLATE 100 MG PO TABS: 100.0000 mg | ORAL_TABLET | Freq: Two times a day (BID) | ORAL | 0 refills | Status: DC

## 2017-07-09 NOTE — Progress Notes (Signed)
Subjective:    Patient ID: Candice Bennett, female    DOB: [birth year], 72 y.o.   MRN: [id number]  HPI   Pt in for evaluation.  Pt states on Monday morning woke up with scratchy throat and cough. Then got ha, body aches, and faint course feeling to skin that day. Had nasal congestion and runny nose. Faint sinus pressure but not severe.  Pt cough is dry mostly but occasional productive.  Pt states hears occasional rattle in chest. No wheezing.  Fever 102 today. Pt thinks bodyaches started on Wednesday.     Review of Systems  Constitutional: Positive for chills, fatigue and fever.       One night this week woke up sweating. That night turned up her heat.  HENT: Positive for congestion, sinus pressure and sinus pain.        Faint sinus pressure.  Respiratory: Positive for cough. Negative for chest tightness, shortness of breath and wheezing.        Mild sob intermittent. No obvious wheeze.  Cardiovascular: Negative for chest pain and palpitations.  Gastrointestinal: Negative for abdominal pain, diarrhea, nausea and vomiting.  Genitourinary: Negative for decreased urine volume, dyspareunia, dysuria, flank pain, urgency and vaginal pain.  Musculoskeletal: Positive for myalgias. Negative for arthralgias, back pain, neck pain and neck stiffness.  Skin: Negative for rash.  Neurological: Negative for dizziness, speech difficulty, weakness, light-headedness and headaches.  Hematological: Negative for adenopathy. Does not bruise/bleed easily.  Psychiatric/Behavioral: Negative for behavioral problems, confusion, dysphoric mood, sleep disturbance and suicidal ideas. The patient is not nervous/anxious.     Past Medical History:  Diagnosis Date  . Follicular lymphoma ([place])   . OSA (obstructive sleep apnea)   . Sarcoidosis   . Unspecified essential hypertension      Social History   Socioeconomic History  . Marital status: Divorced    Spouse name: Not on file  . Number of  children: 2  . Years of education: Not on file  . Highest education level: Not on file  Social Needs  . Financial resource strain: Not on file  . Food insecurity - worry: Not on file  . Food insecurity - inability: Not on file  . Transportation needs - medical: Not on file  . Transportation needs - non-medical: Not on file  Occupational History  . Occupation: [employer]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[place]  Tobacco Use  . Smoking status: Never Smoker  . Smokeless tobacco: Never Used  Substance and Sexual Activity  . Alcohol use: No  . Drug use: No  . Sexual activity: Not Currently    Partners: Male  Other Topics Concern  . Not on file  Social History Narrative   No regularly exercise   Live with daughter and her daughter    Past Surgical History:  Procedure Laterality Date  . ABDOMINAL HYSTERECTOMY  1983  . CARPAL TUNNEL RELEASE    . LYMPH NODE BIOPSY    . port a cath insertion  2005  . PORT-A-CATH REMOVAL  2006  . TUBAL LIGATION  1978    Family History  Problem Relation Age of Onset  . Lung cancer Father   . Diabetes Brother   . Colon cancer Brother   . Heart disease Mother   . Heart disease Sister 38       chf  . Hypertension Sister   . Multiple myeloma Sister   . Bell's palsy Sister   . Diabetes Sister  Allergies  Allergen Reactions  . Iohexol Itching  . Ivp Dye [Iodinated Diagnostic Agents] Itching    Current Outpatient Medications on File Prior to Visit  Medication Sig Dispense Refill  . amLODipine (NORVASC) 5 MG tablet TAKE 1 TABLET BY MOUTH ONCE DAILY 90 tablet 1  . labetalol (NORMODYNE) 100 MG tablet TAKE ONE TABLET BY MOUTH TWICE DAILY 60 tablet 5  . mometasone (ELOCON) 0.1 % cream Apply 1 application topically daily. 45 g 1   No current facility-administered medications on file prior to visit.     There were no vitals taken for this visit.      Objective:   Physical Exam   General  Mental Status - Alert. General  Appearance - Well groomed. Not in acute distress.  Skin Rashes- No Rashes.  HEENT Head- Normal. Ear Auditory Canal - Left- Normal. Right - Normal.Tympanic Membrane- Left- Normal. Right- Normal. Eye Sclera/Conjunctiva- Left- Normal. Right- Normal. Nose & Sinuses Nasal Mucosa- Left-  Boggy and Congested. Right-  Boggy and  Congested.Bilateral no  maxillary and  No frontal sinus pressure. Mouth & Throat Lips: Upper Lip- Normal: no dryness, cracking, pallor, cyanosis, or vesicular eruption. Lower Lip-Normal: no dryness, cracking, pallor, cyanosis or vesicular eruption. Buccal Mucosa- Bilateral- No Aphthous ulcers. Oropharynx- No Discharge or Erythema. Tonsils: Characteristics- Bilateral-faint erythema . Size/Enlargement- Bilateral- No enlargement. Discharge- bilateral-None.  Neck Neck- Supple. No Masses.   Chest and Lung Exam Auscultation: Breath Sounds:- even and unlabored. Faint rough breath sounds at bases.  Cardiovascular Auscultation:Rythm- Regular, rate and rhythm. Murmurs & Other Heart Sounds:Ausculatation of the heart reveal- No Murmurs.  Lymphatic Head & Neck General Head & Neck Lymphatics: Bilateral: Description- No Localized lymphadenopathy.     Assessment & Plan:  You do appear to have recent viral syndrome/possible flu despite the rapid flu test being negative.  By her history, I think viral syndrome started on Wednesday and you are within the treatment timeframe with Tamiflu.  Please start that today.  Also have concern for possible secondary infection of bronchitis versus pneumonia.  I am going to prescribe doxycycline antibiotic.  For your cough prescribing Hycodan.  If you have any wheezing I am making albuterol inhaler available.  For your high fevers, can alternate Tylenol and ibuprofen as directed.  Make sure you rest a lot and stay well-hydrated. Please get CBC today and chest x-ray.  I think you will improve gradually but if you have worse signs and  symptoms despite the above measures then recommend ED evaluation.  Follow-up in 7 days or as needed.  Serafin Dec[place], Percell Miller, PA-C

## 2017-07-09 NOTE — Patient Instructions (Addendum)
You do appear to have recent viral syndrome/possible flu despite the rapid flu test being negative.  By your history, I think viral syndrome started on Wednesday and you are within the treatment timeframe with Tamiflu.  Please start that today.  Also have concern for possible secondary infection of bronchitis versus pneumonia.  I am going to prescribe doxycycline antibiotic.  For your cough prescribing Hycodan.  If you have any wheezing I am making albuterol inhaler available.  For your high fevers, can alternate Tylenol and ibuprofen as directed.  Make sure you rest a lot and stay well-hydrated. Please get CBC today and chest x-ray.  I think you will improve gradually but if you have worse signs and symptoms despite the above measures then recommend ED evaluation.  Follow-up in 7 days or as needed.

## 2017-08-27 ENCOUNTER — Encounter: Payer: Self-pay | Admitting: Family Medicine

## 2017-08-27 ENCOUNTER — Ambulatory Visit (INDEPENDENT_AMBULATORY_CARE_PROVIDER_SITE_OTHER): Payer: Medicare Other | Admitting: Family Medicine

## 2017-08-27 VITALS — BP 120/64 | HR 79 | Temp 98.1°F | Resp 16 | Ht 62.99 in | Wt 193.4 lb

## 2017-08-27 DIAGNOSIS — E785 Hyperlipidemia, unspecified: Secondary | ICD-10-CM

## 2017-08-27 DIAGNOSIS — I10 Essential (primary) hypertension: Secondary | ICD-10-CM | POA: Diagnosis not present

## 2017-08-27 LAB — POC URINALSYSI DIPSTICK (AUTOMATED)
BILIRUBIN UA: NEGATIVE
Blood, UA: NEGATIVE
GLUCOSE UA: NEGATIVE
KETONES UA: NEGATIVE
LEUKOCYTES UA: NEGATIVE
Nitrite, UA: NEGATIVE
PROTEIN UA: NEGATIVE
Urobilinogen, UA: 0.2 E.U./dL
pH, UA: 6 (ref 5.0–8.0)

## 2017-08-27 LAB — COMPREHENSIVE METABOLIC PANEL
ALBUMIN: 4.2 g/dL (ref 3.5–5.2)
ALK PHOS: 67 U/L (ref 39–117)
ALT: 14 U/L (ref 0–35)
AST: 15 U/L (ref 0–37)
BUN: 11 mg/dL (ref 6–23)
CHLORIDE: 102 meq/L (ref 96–112)
CO2: 31 mEq/L (ref 19–32)
Calcium: 10.2 mg/dL (ref 8.4–10.5)
Creatinine, Ser: 0.72 mg/dL (ref 0.40–1.20)
GFR: 102.98 mL/min (ref >60.00)
Glucose, Bld: 102 mg/dL — ABNORMAL HIGH (ref 70–99)
POTASSIUM: 4.6 meq/L (ref 3.5–5.1)
SODIUM: 141 meq/L (ref 135–145)
TOTAL PROTEIN: 7.8 g/dL (ref 6.0–8.3)
Total Bilirubin: 0.3 mg/dL (ref 0.2–1.2)

## 2017-08-27 LAB — LIPID PANEL
CHOLESTEROL: 175 mg/dL (ref 0–200)
HDL: 60.2 mg/dL (ref >39.00)
LDL CALC: 99 mg/dL (ref 0–99)
NonHDL: 114.5
Total CHOL/HDL Ratio: 3
Triglycerides: 78 mg/dL (ref 0.0–149.0)
VLDL: 15.6 mg/dL (ref 0.0–40.0)

## 2017-08-27 LAB — MICROALBUMIN / CREATININE URINE RATIO
Creatinine,U: 253.4 mg/dL
MICROALB/CREAT RATIO: 0.6 mg/g (ref 0.0–30.0)
Microalb, Ur: 1.5 mg/dL (ref 0.0–1.9)

## 2017-08-27 MED ORDER — AMLODIPINE BESYLATE 5 MG PO TABS: 5.0000 mg | ORAL_TABLET | Freq: Every day | ORAL | 1 refills | Status: DC

## 2017-08-27 MED ORDER — LABETALOL HCL 100 MG PO TABS: 100.0000 mg | ORAL_TABLET | Freq: Two times a day (BID) | ORAL | 3 refills | Status: DC

## 2017-08-27 NOTE — Assessment & Plan Note (Signed)
Encouraged heart healthy diet, increase exercise, avoid trans fats, consider a krill oil cap daily 

## 2017-08-27 NOTE — Assessment & Plan Note (Signed)
Well controlled, no changes to meds. Encouraged heart healthy diet such as the DASH diet and exercise as tolerated.  °

## 2017-08-27 NOTE — Patient Instructions (Signed)

## 2017-08-27 NOTE — Progress Notes (Signed)
Subjective:  I acted as a Education administrator for [employer]. Candice Bennett, Candice Bennett   Patient ID: Candice Bennett, female    DOB: 03-16-1946, 72 y.o.   MRN: [id number]  Chief Complaint  Patient presents with  . Follow-up    HPI  Patient is in today for follow up visit for bp and cholesterol.  No complaints.    Patient Care Team: Carollee Herter, Alferd Apa, DO as PCP - General Inda Castle, MD (Inactive) as Consulting Physician (Gastroenterology)   Past Medical History:  Diagnosis Date  . Follicular lymphoma ([place])   . OSA (obstructive sleep apnea)   . Sarcoidosis   . Unspecified essential hypertension     Past Surgical History:  Procedure Laterality Date  . ABDOMINAL HYSTERECTOMY  1983  . CARPAL TUNNEL RELEASE    . LYMPH NODE BIOPSY    . port a cath insertion  2005  . PORT-A-CATH REMOVAL  2006  . TUBAL LIGATION  1978    Family History  Problem Relation Age of Onset  . Lung cancer Father   . Diabetes Brother   . Colon cancer Brother   . Heart disease Mother   . Heart disease Sister 35       chf  . Hypertension Sister   . Multiple myeloma Sister   . Bell's palsy Sister   . Diabetes Sister     Social History   Socioeconomic History  . Marital status: Divorced    Spouse name: Not on file  . Number of children: 2  . Years of education: Not on file  . Highest education level: Not on file  Social Needs  . Financial resource strain: Not on file  . Food insecurity - worry: Not on file  . Food insecurity - inability: Not on file  . Transportation needs - medical: Not on file  . Transportation needs - non-medical: Not on file  Occupational History  . Occupation: [employer]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[place]  Tobacco Use  . Smoking status: Never Smoker  . Smokeless tobacco: Never Used  Substance and Sexual Activity  . Alcohol use: No  . Drug use: No  . Sexual activity: Not Currently    Partners: Male  Other Topics Concern  . Not on file  Social History  Narrative   No regularly exercise   Live with daughter and her daughter    Outpatient Medications Prior to Visit  Medication Sig Dispense Refill  . mometasone (ELOCON) 0.1 % cream Apply 1 application topically daily. 45 g 1  . albuterol (PROVENTIL HFA;VENTOLIN HFA) 108 (90 Base) MCG/ACT inhaler Inhale 2 puffs into the lungs every 6 (six) hours as needed for wheezing or shortness of breath. 1 Inhaler 2  . amLODipine (NORVASC) 5 MG tablet TAKE 1 TABLET BY MOUTH ONCE DAILY 90 tablet 1  . labetalol (NORMODYNE) 100 MG tablet TAKE ONE TABLET BY MOUTH TWICE DAILY 60 tablet 5  . oseltamivir (TAMIFLU) 75 MG capsule Take 1 capsule (75 mg total) by mouth 2 (two) times daily. 10 capsule 0  . doxycycline (VIBRA-TABS) 100 MG tablet Take 1 tablet (100 mg total) by mouth 2 (two) times daily. Can give caps or generic (Patient not taking: Reported on 08/27/2017) 20 tablet 0  . HYDROcodone-homatropine (HYCODAN) 5-1.5 MG/5ML syrup Take 5 mLs by mouth every 8 (eight) hours as needed. (Patient not taking: Reported on 08/27/2017) 100 mL 0   No facility-administered medications prior to visit.  Allergies  Allergen Reactions  . Iohexol Itching  . Ivp Dye [Iodinated Diagnostic Agents] Itching    Review of Systems  Constitutional: Negative for chills, fever and malaise/fatigue.  HENT: Negative for congestion and hearing loss.   Eyes: Negative for discharge.  Respiratory: Negative for cough, sputum production and shortness of breath.   Cardiovascular: Negative for chest pain, palpitations and leg swelling.  Gastrointestinal: Negative for abdominal pain, blood in stool, constipation, diarrhea, heartburn, nausea and vomiting.  Genitourinary: Negative for dysuria, frequency, hematuria and urgency.  Musculoskeletal: Negative for back pain, falls and myalgias.  Skin: Negative for rash.  Neurological: Negative for dizziness, sensory change, loss of consciousness, weakness and headaches.  Endo/Heme/Allergies:  Negative for environmental allergies. Does not bruise/bleed easily.  Psychiatric/Behavioral: Negative for depression and suicidal ideas. The patient is not nervous/anxious and does not have insomnia.        Objective:    Physical Exam  Constitutional: She is oriented to person, place, and time. She appears well-developed and well-nourished.  HENT:  Head: Normocephalic and atraumatic.  Eyes: Conjunctivae and EOM are normal.  Neck: Normal range of motion. Neck supple. No JVD present. Carotid bruit is not present. No thyromegaly present.  Cardiovascular: Normal rate, regular rhythm and normal heart sounds.  No murmur heard. Pulmonary/Chest: Effort normal and breath sounds normal. No respiratory distress. She has no wheezes. She has no rales. She exhibits no tenderness.  Musculoskeletal: She exhibits no edema.  Neurological: She is alert and oriented to person, place, and time.  Psychiatric: She has a normal mood and affect.  Nursing note and vitals reviewed.   BP 120/64 (BP Location: Left Arm, Patient Position: Sitting, Cuff Size: Normal)   Pulse 79   Temp 98.1 F (36.7 C) (Oral)   Resp 16   Ht 5' 2.99" (1.6 m)   Wt 193 lb 6.4 oz (87.7 kg)   SpO2 99%   BMI 34.27 kg/m  Wt Readings from Last 3 Encounters:  08/27/17 193 lb 6.4 oz (87.7 kg)  07/09/17 199 lb (90.3 kg)  05/10/17 203 lb 3.2 oz (92.2 kg)   BP Readings from Last 3 Encounters:  08/27/17 120/64  07/09/17 129/70  05/10/17 126/78     Immunization History  Administered Date(s) Administered  . Influenza Whole 03/26/2009  . Influenza, High Dose Seasonal PF 02/25/2017  . Pneumococcal Conjugate-13 01/30/2014  . Pneumococcal Polysaccharide-23 12/09/2012  . Td 08/10/2000  . Tdap 04/07/2011  . Zoster 02/10/2010    Health Maintenance  Topic Date Due  . MAMMOGRAM  08/22/2016  . TETANUS/TDAP  04/06/2021  . COLONOSCOPY  08/19/2021  . INFLUENZA VACCINE  Completed  . DEXA SCAN  Completed  . Hepatitis C Screening   Completed  . PNA vac Low Risk Adult  Completed    Lab Results  Component Value Date   WBC 10.4 07/09/2017   HGB 12.3 07/09/2017   HCT 38.7 07/09/2017   PLT 278.0 07/09/2017   GLUCOSE 101 (H) 02/25/2017   CHOL 191 02/25/2017   TRIG 79.0 02/25/2017   HDL 60.20 02/25/2017   LDLCALC 115 (H) 02/25/2017   ALT 19 02/25/2017   AST 20 02/25/2017   NA 140 02/25/2017   K 4.0 02/25/2017   CL 104 02/25/2017   CREATININE 0.76 02/25/2017   BUN 12 02/25/2017   CO2 28 02/25/2017   TSH 0.86 04/07/2011   MICROALBUR 0.9 08/08/2015    Lab Results  Component Value Date   TSH 0.86 04/07/2011   Lab Results  Component Value Date   WBC 10.4 07/09/2017   HGB 12.3 07/09/2017   HCT 38.7 07/09/2017   MCV 80.5 07/09/2017   PLT 278.0 07/09/2017   Lab Results  Component Value Date   NA 140 02/25/2017   K 4.0 02/25/2017   CO2 28 02/25/2017   GLUCOSE 101 (H) 02/25/2017   BUN 12 02/25/2017   CREATININE 0.76 02/25/2017   BILITOT 0.5 02/25/2017   ALKPHOS 59 02/25/2017   AST 20 02/25/2017   ALT 19 02/25/2017   PROT 7.7 02/25/2017   ALBUMIN 4.2 02/25/2017   CALCIUM 9.5 02/25/2017   GFR 96.89 02/25/2017   Lab Results  Component Value Date   CHOL 191 02/25/2017   Lab Results  Component Value Date   HDL 60.20 02/25/2017   Lab Results  Component Value Date   LDLCALC 115 (H) 02/25/2017   Lab Results  Component Value Date   TRIG 79.0 02/25/2017   Lab Results  Component Value Date   CHOLHDL 3 02/25/2017   No results found for: HGBA1C       Assessment & Plan:   Problem List Items Addressed This Visit      Unprioritized   Essential hypertension    Well controlled, no changes to meds. Encouraged heart healthy diet such as the DASH diet and exercise as tolerated.       Relevant Medications   labetalol (NORMODYNE) 100 MG tablet   amLODipine (NORVASC) 5 MG tablet   Other Relevant Orders   Comprehensive metabolic panel   Lipid panel   POCT Urinalysis Dipstick (Automated)    Microalbumin / creatinine urine ratio   Hyperlipidemia LDL goal <100 - Primary    Encouraged heart healthy diet, increase exercise, avoid trans fats, consider a krill oil cap daily      Relevant Medications   labetalol (NORMODYNE) 100 MG tablet   amLODipine (NORVASC) 5 MG tablet   Other Relevant Orders   Comprehensive metabolic panel   Lipid panel   POCT Urinalysis Dipstick (Automated)   Microalbumin / creatinine urine ratio      I have discontinued Lisa-Marie T. Fleet's oseltamivir, albuterol, and HYDROcodone-homatropine. I have also changed her labetalol and amLODipine. Additionally, I am having her maintain her mometasone and doxycycline.  Meds ordered this encounter  Medications  . labetalol (NORMODYNE) 100 MG tablet    Sig: Take 1 tablet (100 mg total) by mouth 2 (two) times daily.    Dispense:  180 tablet    Refill:  3    Please consider 90 day supplies to promote better adherence  . amLODipine (NORVASC) 5 MG tablet    Sig: Take 1 tablet (5 mg total) by mouth daily.    Dispense:  90 tablet    Refill:  1   CMA served as Education administrator during this visit. History, Physical and Plan performed by medical provider. Documentation and orders reviewed and attested to [name], DO

## 2017-08-30 ENCOUNTER — Encounter: Payer: Self-pay | Admitting: *Deleted

## 2017-12-29 ENCOUNTER — Other Ambulatory Visit: Payer: Self-pay | Admitting: Family Medicine

## 2018-02-25 ENCOUNTER — Other Ambulatory Visit: Payer: Self-pay | Admitting: Family Medicine

## 2018-02-25 DIAGNOSIS — I10 Essential (primary) hypertension: Secondary | ICD-10-CM

## 2018-05-11 ENCOUNTER — Ambulatory Visit: Payer: Medicare Other | Admitting: *Deleted

## 2018-05-13 NOTE — Progress Notes (Signed)
Subjective:   Candice Bennett is a 72 y.o. female who presents for Medicare Annual (Subsequent) preventive examination.  She enjoys creative writing and writing children's plays. 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  Review of Systems: No ROS.  Medicare Wellness Visit. Additional risk factors are reflected in the social history. Cardiac Risk Factors include: advanced age (>29mn, >>46women);dyslipidemia;hypertension;obesity (BMI >30kg/m2) Sleep patterns: varies Home Safety/Smoke Alarms: Feels safe in home. Smoke alarms in place. Lives in 2 story townhouse with daughter.    Female:        Mammo-pt states she will schedule       Dexa scan- pt states she will schedule        CCS- next due 07/2021    Objective:     Vitals: BP 120/63 (BP Location: Right Arm, Patient Position: Sitting, Cuff Size: Large)   Pulse 70   Ht 5' 3"  (1.6 m)   Wt 200 lb (90.7 kg)   SpO2 100%   BMI 35.43 kg/m   Body mass index is 35.43 kg/m.  Advanced Directives 05/16/2018 05/10/2017 08/18/2016 05/08/2016  Does Patient Have a Medical Advance Directive? Yes Yes Yes Yes  Type of AParamedicof [place]Living will [place]Living will [place]Living will [place]Living will  Does patient want to make changes to medical advance directive? - - No - Patient declined -  Copy of [place] Chart? Yes - validated most recent copy scanned in chart (See row information) Yes No - copy requested No - copy requested    Tobacco Social History   Tobacco Use  Smoking Status Never Smoker  Smokeless Tobacco Never Used     Counseling given: Not Answered   Clinical Intake:     Pain : No/denies pain                 Past Medical History:  Diagnosis Date  . [place]   . OSA (obstructive sleep apnea)   . Sarcoidosis   . Unspecified essential hypertension    Past Surgical History:    Procedure Laterality Date  . ABDOMINAL HYSTERECTOMY  1983  . CARPAL TUNNEL RELEASE    . LYMPH NODE BIOPSY    . port a cath insertion  2005  . PORT-A-CATH REMOVAL  2006  . TUBAL LIGATION  1978   Family History  Problem Relation Age of Onset  . Lung cancer Father   . Heart attack Father   . Diabetes Brother   . Colon cancer Brother   . Heart disease Mother   . Heart disease Sister 633      chf  . Dementia Sister   . Kidney disease Sister   . Hypertension Sister   . Multiple myeloma Sister   . Bell's palsy Sister   . Heart attack Sister 745 . Diabetes Sister    Social History   Socioeconomic History  . Marital status: Divorced    Spouse name: Not on file  . Number of children: 2  . Years of education: Not on file  . Highest education level: Not on file  Occupational History  . 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 Financial resource strain: Not on file  . Food insecurity:    Worry: Not on file    Inability: Not on file  . Transportation needs:    Medical: Not  on file    Non-medical: Not on file  Tobacco Use  . Smoking status: Never Smoker  . Smokeless tobacco: Never Used  Substance and Sexual Activity  . Alcohol use: No  . Drug use: No  . Sexual activity: Not Currently    Partners: Male  Lifestyle  . Physical activity:    Days per week: Not on file    Minutes per session: Not on file  . Stress: Not on file  Relationships  . Social connections:    Talks on phone: Not on file    Gets together: Not on file    Attends religious service: Not on file    Active member of club or organization: Not on file    Attends meetings of clubs or organizations: Not on file    Relationship status: Not on file  Other Topics Concern  . Not on file  Social History Narrative   No regularly exercise   Live with daughter and her daughter    Outpatient Encounter Medications as of 05/16/2018  Medication Sig  .  amLODipine (NORVASC) 5 MG tablet Take 1 tablet (5 mg total) by mouth daily.  Marland Kitchen labetalol (NORMODYNE) 100 MG tablet Take 1 tablet (100 mg total) by mouth 2 (two) times daily.  . mometasone (ELOCON) 0.1 % cream APPLY  CREAM TOPICALLY TO AFFECTED AREA(S) ONCE DAILY  . [DISCONTINUED] amLODipine (NORVASC) 5 MG tablet TAKE 1 TABLET BY MOUTH ONCE DAILY  . [DISCONTINUED] doxycycline (VIBRA-TABS) 100 MG tablet Take 1 tablet (100 mg total) by mouth 2 (two) times daily. Can give caps or generic (Patient not taking: Reported on 08/27/2017)   No facility-administered encounter medications on file as of 05/16/2018.     Activities of Daily Living In your present state of health, do you have any difficulty performing the following activities: 05/16/2018  Hearing? N  Vision? N  Difficulty concentrating or making decisions? N  Walking or climbing stairs? N  Dressing or bathing? N  Doing errands, shopping? N  Preparing Food and eating ? N  Using the Toilet? N  In the past six months, have you accidently leaked urine? N  Do you have problems with loss of bowel control? N  Managing your Medications? N  Managing your Finances? N  Housekeeping or managing your Housekeeping? N  Some recent data might be hidden    Patient Care Team: Carollee Herter, Alferd Apa, DO as PCP - General Inda Castle, MD (Inactive) as Consulting Physician (Gastroenterology)    Assessment:   This is a routine wellness examination for Deshayla. Physical assessment deferred to PCP.  Exercise Activities and Dietary recommendations Current Exercise Habits: The patient does not participate in regular exercise at present, Exercise limited by: None identified Diet (meal preparation, eat out, water intake, caffeinated beverages, dairy products, fruits and vegetables): in general, an "unhealthy" diet Pt admits she eats a lot of fast food and sweets.  Goals    . Decrease soda or juice intake    . Drink 4 glasses of water per day  (pt-stated)    . Exercise 2x per week (30 min per time)    . Increase physical activity    . Limits sweets to 2 per day (pt-stated)    . Reduce fast food intake    . Weight (lb) < 170 lb (77.1 kg)       Fall Risk Fall Risk  05/16/2018 05/10/2017 05/08/2016 08/08/2015 02/01/2015  Falls in the past year? 0 No No No No  Number falls in past yr: - - - - -  Risk for fall due to : - - - - -  Risk for fall due to: Comment - - - - -   Depression Screen PHQ 2/9 Scores 05/16/2018 05/16/2018 05/10/2017 05/08/2016  PHQ - 2 Score 0 0 0 0  PHQ- 9 Score - 5 - -     Cognitive Function Ad8 score reviewed for issues:  Issues making decisions:no  Less interest in hobbies / activities:no  Repeats questions, stories (family complaining):no  Trouble using ordinary gadgets (microwave, computer, phone):no  Forgets the month or year: no  Mismanaging finances: no  Remembering appts:no Daily problems with thinking and/or memory:no Ad8 score is=0     MMSE - Mini Mental State Exam 05/10/2017 05/08/2016  Orientation to time 5 5  Orientation to Place 5 5  Registration 3 3  Attention/ Calculation 5 5  Recall 3 3  Language- name 2 objects 2 2  Language- repeat 1 1  Language- follow 3 step command 3 3  Language- read & follow direction 1 1  Write a sentence 1 1  Copy design 1 1  Total score 30 30        Immunization History  Administered Date(s) Administered  . Influenza Whole 03/26/2009  . Influenza, High Dose Seasonal PF 02/25/2017, 05/16/2018  . Pneumococcal Conjugate-13 01/30/2014  . Pneumococcal Polysaccharide-23 12/09/2012  . Td 08/10/2000  . Tdap 04/07/2011  . Zoster 02/10/2010    Screening Tests Health Maintenance  Topic Date Due  . MAMMOGRAM  08/22/2016  . TETANUS/TDAP  04/06/2021  . COLONOSCOPY  08/19/2021  . INFLUENZA VACCINE  Completed  . DEXA SCAN  Completed  . Hepatitis C Screening  Completed  . PNA vac Low Risk Adult  Completed       Plan:    Please  schedule your next medicare wellness visit with me in 1 yr.  Eat heart healthy diet (full of fruits, vegetables, whole grains, lean protein, water--limit salt, fat, and sugar intake) and increase physical activity as tolerated.    I have personally reviewed and noted the following in the patient's chart:   . Medical and social history . Use of alcohol, tobacco or illicit drugs  . Current medications and supplements . Functional ability and status . Nutritional status . Physical activity . Advanced directives . List of other physicians . Hospitalizations, surgeries, and ER visits in previous 12 months . Vitals . Screenings to include cognitive, depression, and falls . Referrals and appointments  In addition, I have reviewed and discussed with patient certain preventive protocols, quality metrics, and best practice recommendations. A written personalized care plan for preventive services as well as general preventive health recommendations were provided to patient.     Shela Nevin, [place]  05/16/2018

## 2018-05-16 ENCOUNTER — Ambulatory Visit: Payer: Medicare Other | Admitting: *Deleted

## 2018-05-16 ENCOUNTER — Ambulatory Visit (INDEPENDENT_AMBULATORY_CARE_PROVIDER_SITE_OTHER): Payer: Medicare Other | Admitting: Family Medicine

## 2018-05-16 ENCOUNTER — Encounter: Payer: Self-pay | Admitting: *Deleted

## 2018-05-16 ENCOUNTER — Encounter: Payer: Self-pay | Admitting: Family Medicine

## 2018-05-16 VITALS — BP 120/63 | HR 70 | Ht 63.0 in | Wt 200.0 lb

## 2018-05-16 VITALS — BP 120/63 | HR 70 | Temp 98.3°F | Resp 16 | Ht 63.0 in | Wt 200.8 lb

## 2018-05-16 DIAGNOSIS — I10 Essential (primary) hypertension: Secondary | ICD-10-CM | POA: Diagnosis not present

## 2018-05-16 DIAGNOSIS — E785 Hyperlipidemia, unspecified: Secondary | ICD-10-CM | POA: Diagnosis not present

## 2018-05-16 DIAGNOSIS — Z0000 Encounter for general adult medical examination without abnormal findings: Secondary | ICD-10-CM | POA: Diagnosis not present

## 2018-05-16 DIAGNOSIS — Z23 Encounter for immunization: Secondary | ICD-10-CM

## 2018-05-16 LAB — COMPREHENSIVE METABOLIC PANEL
ALBUMIN: 4.2 g/dL (ref 3.5–5.2)
ALK PHOS: 65 U/L (ref 39–117)
ALT: 15 U/L (ref 0–35)
AST: 16 U/L (ref 0–37)
BUN: 11 mg/dL (ref 6–23)
CO2: 30 meq/L (ref 19–32)
Calcium: 9.6 mg/dL (ref 8.4–10.5)
Chloride: 104 mEq/L (ref 96–112)
Creatinine, Ser: 0.75 mg/dL (ref 0.40–1.20)
GFR: 98.04 mL/min (ref >60.00)
Glucose, Bld: 100 mg/dL — ABNORMAL HIGH (ref 70–99)
Potassium: 4.5 mEq/L (ref 3.5–5.1)
Sodium: 141 mEq/L (ref 135–145)
TOTAL PROTEIN: 7.4 g/dL (ref 6.0–8.3)
Total Bilirubin: 0.5 mg/dL (ref 0.2–1.2)

## 2018-05-16 LAB — LIPID PANEL
CHOL/HDL RATIO: 3
CHOLESTEROL: 187 mg/dL (ref 0–200)
HDL: 67.7 mg/dL (ref >39.00)
LDL Cholesterol: 105 mg/dL — ABNORMAL HIGH (ref 0–99)
NonHDL: 118.9
Triglycerides: 71 mg/dL (ref 0.0–149.0)
VLDL: 14.2 mg/dL (ref 0.0–40.0)

## 2018-05-16 NOTE — Assessment & Plan Note (Signed)
Well controlled, no changes to meds. Encouraged heart healthy diet such as the DASH diet and exercise as tolerated.  °

## 2018-05-16 NOTE — Patient Instructions (Signed)
Please schedule your next medicare wellness visit with me in 1 yr.  Eat heart healthy diet (full of fruits, vegetables, whole grains, lean protein, water--limit salt, fat, and sugar intake) and increase physical activity as tolerated.   Candice Bennett , Thank you for taking time to come for your Medicare Wellness Visit. I appreciate your ongoing commitment to your health goals. Please review the following plan we discussed and let me know if I can assist you in the future.   These are the goals we discussed: Goals    . Decrease soda or juice intake    . Drink 4 glasses of water per day (pt-stated)    . Exercise 2x per week (30 min per time)    . Increase physical activity    . Limits sweets to 2 per day (pt-stated)    . Reduce fast food intake    . Weight (lb) < 170 lb (77.1 kg)       This is a list of the screening recommended for you and due dates:  Health Maintenance  Topic Date Due  . Mammogram  08/22/2016  . Tetanus Vaccine  04/06/2021  . Colon Cancer Screening  08/19/2021  . Flu Shot  Completed  . DEXA scan (bone density measurement)  Completed  .  Hepatitis C: One time screening is recommended by Center for Disease Control  (CDC) for  adults born from [birth year] through 1965.   Completed  . Pneumonia vaccines  Completed    Health Maintenance for Postmenopausal Women Menopause is a normal process in which your reproductive ability comes to an end. This process happens gradually over a span of months to years, usually between the ages of 66 and 60. Menopause is complete when you have missed 12 consecutive menstrual periods. It is important to talk with your health care provider about some of the most common conditions that affect postmenopausal women, such as heart disease, cancer, and bone loss (osteoporosis). Adopting a healthy lifestyle and getting preventive care can help to promote your health and wellness. Those actions can also lower your chances of developing some of these common  conditions. What should I know about menopause? During menopause, you may experience a number of symptoms, such as:  Moderate-to-severe hot flashes.  Night sweats.  Decrease in sex drive.  Mood swings.  Headaches.  Tiredness.  Irritability.  Memory problems.  Insomnia.  Choosing to treat or not to treat menopausal changes is an individual decision that you make with your health care provider. What should I know about hormone replacement therapy and supplements? Hormone therapy products are effective for treating symptoms that are associated with menopause, such as hot flashes and night sweats. Hormone replacement carries certain risks, especially as you become older. If you are thinking about using estrogen or estrogen with progestin treatments, discuss the benefits and risks with your health care provider. What should I know about heart disease and stroke? Heart disease, heart attack, and stroke become more likely as you age. This may be due, in part, to the hormonal changes that your body experiences during menopause. These can affect how your body processes dietary fats, triglycerides, and cholesterol. Heart attack and stroke are both medical emergencies. There are many things that you can do to help prevent heart disease and stroke:  Have your blood pressure checked at least every 1-2 years. High blood pressure causes heart disease and increases the risk of stroke.  If you are 82-51 years old, ask your health care  provider if you should take aspirin to prevent a heart attack or a stroke.  Do not use any tobacco products, including cigarettes, chewing tobacco, or electronic cigarettes. If you need help quitting, ask your health care provider.  It is important to eat a healthy diet and maintain a healthy weight. ? Be sure to include plenty of vegetables, fruits, low-fat dairy products, and lean protein. ? Avoid eating foods that are high in solid fats, added sugars, or salt  (sodium).  Get regular exercise. This is one of the most important things that you can do for your health. ? Try to exercise for at least 150 minutes each week. The type of exercise that you do should increase your heart rate and make you sweat. This is known as moderate-intensity exercise. ? Try to do strengthening exercises at least twice each week. Do these in addition to the moderate-intensity exercise.  Know your numbers.Ask your health care provider to check your cholesterol and your blood glucose. Continue to have your blood tested as directed by your health care provider.  What should I know about cancer screening? There are several types of cancer. Take the following steps to reduce your risk and to catch any cancer development as early as possible. Breast Cancer  Practice breast self-awareness. ? This means understanding how your breasts normally appear and feel. ? It also means doing regular breast self-exams. Let your health care provider know about any changes, no matter how small.  If you are 7 or older, have a clinician do a breast exam (clinical breast exam or CBE) every year. Depending on your age, family history, and medical history, it may be recommended that you also have a yearly breast X-ray (mammogram).  If you have a family history of breast cancer, talk with your health care provider about genetic screening.  If you are at high risk for breast cancer, talk with your health care provider about having an MRI and a mammogram every year.  Breast cancer (BRCA) gene test is recommended for women who have family members with BRCA-related cancers. Results of the assessment will determine the need for genetic counseling and BRCA1 and for BRCA2 testing. BRCA-related cancers include these types: ? Breast. This occurs in males or females. ? Ovarian. ? Tubal. This may also be called fallopian tube cancer. ? Cancer of the abdominal or pelvic lining (peritoneal  cancer). ? Prostate. ? Pancreatic.  Cervical, Uterine, and Ovarian Cancer Your health care provider may recommend that you be screened regularly for cancer of the pelvic organs. These include your ovaries, uterus, and vagina. This screening involves a pelvic exam, which includes checking for microscopic changes to the surface of your cervix (Pap test).  For women ages 21-65, health care providers may recommend a pelvic exam and a Pap test every three years. For women ages 74-65, they may recommend the Pap test and pelvic exam, combined with testing for human papilloma virus (HPV), every five years. Some types of HPV increase your risk of cervical cancer. Testing for HPV may also be done on women of any age who have unclear Pap test results.  Other health care providers may not recommend any screening for nonpregnant women who are considered low risk for pelvic cancer and have no symptoms. Ask your health care provider if a screening pelvic exam is right for you.  If you have had past treatment for cervical cancer or a condition that could lead to cancer, you need Pap tests and screening for  cancer for at least 20 years after your treatment. If Pap tests have been discontinued for you, your risk factors (such as having a new sexual partner) need to be reassessed to determine if you should start having screenings again. Some women have medical problems that increase the chance of getting cervical cancer. In these cases, your health care provider may recommend that you have screening and Pap tests more often.  If you have a family history of uterine cancer or ovarian cancer, talk with your health care provider about genetic screening.  If you have vaginal bleeding after reaching menopause, tell your health care provider.  There are currently no reliable tests available to screen for ovarian cancer.  Lung Cancer Lung cancer screening is recommended for adults 33-78 years old who are at high risk for  lung cancer because of a history of smoking. A yearly low-dose CT scan of the lungs is recommended if you:  Currently smoke.  Have a history of at least 30 pack-years of smoking and you currently smoke or have quit within the past 15 years. A pack-year is smoking an average of one pack of cigarettes per day for one year.  Yearly screening should:  Continue until it has been 15 years since you quit.  Stop if you develop a health problem that would prevent you from having lung cancer treatment.  Colorectal Cancer  This type of cancer can be detected and can often be prevented.  Routine colorectal cancer screening usually begins at age 42 and continues through age 66.  If you have risk factors for colon cancer, your health care provider may recommend that you be screened at an earlier age.  If you have a family history of colorectal cancer, talk with your health care provider about genetic screening.  Your health care provider may also recommend using home test kits to check for hidden blood in your stool.  A small camera at the end of a tube can be used to examine your colon directly (sigmoidoscopy or colonoscopy). This is done to check for the earliest forms of colorectal cancer.  Direct examination of the colon should be repeated every 5-10 years until age 67. However, if early forms of precancerous polyps or small growths are found or if you have a family history or genetic risk for colorectal cancer, you may need to be screened more often.  Skin Cancer  Check your skin from head to toe regularly.  Monitor any moles. Be sure to tell your health care provider: ? About any new moles or changes in moles, especially if there is a change in a mole's shape or color. ? If you have a mole that is larger than the size of a pencil eraser.  If any of your family members has a history of skin cancer, especially at a young age, talk with your health care provider about genetic  screening.  Always use sunscreen. Apply sunscreen liberally and repeatedly throughout the day.  Whenever you are outside, protect yourself by wearing long sleeves, pants, a wide-brimmed hat, and sunglasses.  What should I know about osteoporosis? Osteoporosis is a condition in which bone destruction happens more quickly than new bone creation. After menopause, you may be at an increased risk for osteoporosis. To help prevent osteoporosis or the bone fractures that can happen because of osteoporosis, the following is recommended:  If you are 41-74 years old, get at least 1,000 mg of calcium and at least 600 mg of vitamin D per  day.  If you are older than age 62 but younger than age 37, get at least 1,200 mg of calcium and at least 600 mg of vitamin D per day.  If you are older than age 74, get at least 1,200 mg of calcium and at least 800 mg of vitamin D per day.  Smoking and excessive alcohol intake increase the risk of osteoporosis. Eat foods that are rich in calcium and vitamin D, and do weight-bearing exercises several times each week as directed by your health care provider. What should I know about how menopause affects my mental health? Depression may occur at any age, but it is more common as you become older. Common symptoms of depression include:  Low or sad mood.  Changes in sleep patterns.  Changes in appetite or eating patterns.  Feeling an overall lack of motivation or enjoyment of activities that you previously enjoyed.  Frequent crying spells.  Talk with your health care provider if you think that you are experiencing depression. What should I know about immunizations? It is important that you get and maintain your immunizations. These include:  Tetanus, diphtheria, and pertussis (Tdap) booster vaccine.  Influenza every year before the flu season begins.  Pneumonia vaccine.  Shingles vaccine.  Your health care provider may also recommend other  immunizations. This information is not intended to replace advice given to you by your health care provider. Make sure you discuss any questions you have with your health care provider. Document Released: 07/31/2005 Document Revised: 12/27/2015 Document Reviewed: 03/12/2015 Elsevier Interactive Patient Education  2018 [employer].

## 2018-05-16 NOTE — Patient Instructions (Signed)

## 2018-05-16 NOTE — Assessment & Plan Note (Signed)
Encouraged heart healthy diet, increase exercise, avoid trans fats, consider a krill oil cap daily 

## 2018-05-16 NOTE — Progress Notes (Signed)
Patient ID: Candice Bennett, female    DOB: [birth year]  Age: 72 y.o. MRN: [id number]    Subjective:  Subjective  HPI Candice Bennett presents for bp an chol.  No complaints.    Review of Systems  Constitutional: Negative for appetite change, diaphoresis, fatigue and unexpected weight change.  Eyes: Negative for pain, redness and visual disturbance.  Respiratory: Negative for cough, chest tightness, shortness of breath and wheezing.   Cardiovascular: Negative for chest pain, palpitations and leg swelling.  Endocrine: Negative for cold intolerance, heat intolerance, polydipsia, polyphagia and polyuria.  Genitourinary: Negative for difficulty urinating, dysuria and frequency.  Neurological: Negative for dizziness, light-headedness, numbness and headaches.  Psychiatric/Behavioral: Negative for decreased concentration. The patient is not nervous/anxious.     History Past Medical History:  Diagnosis Date  . Follicular lymphoma ([place])   . OSA (obstructive sleep apnea)   . Sarcoidosis   . Unspecified essential hypertension     She has a past surgical history that includes Carpal tunnel release; Tubal ligation (1978); Lymph node biopsy; Abdominal hysterectomy (1983); port a cath insertion (2005); and Port-a-cath removal (2006).   Her family history includes Bell's palsy in her sister; Colon cancer in her brother; Dementia in her sister; Diabetes in her brother and sister; Heart attack in her father; Heart attack (age of onset: 71) in her sister; Heart disease in her mother; Heart disease (age of onset: 38) in her sister; Hypertension in her sister; Kidney disease in her sister; Lung cancer in her father; Multiple myeloma in her sister.She reports that she has never smoked. She has never used smokeless tobacco. She reports that she does not drink alcohol or use drugs.  Current Outpatient Medications on File Prior to Visit  Medication Sig Dispense Refill  . amLODipine (NORVASC) 5 MG tablet  Take 1 tablet (5 mg total) by mouth daily. 90 tablet 1  . labetalol (NORMODYNE) 100 MG tablet Take 1 tablet (100 mg total) by mouth 2 (two) times daily. 180 tablet 3  . mometasone (ELOCON) 0.1 % cream APPLY  CREAM TOPICALLY TO AFFECTED AREA(S) ONCE DAILY 45 g 1   No current facility-administered medications on file prior to visit.      Objective:  Objective  Physical Exam  Constitutional: She is oriented to person, place, and time. She appears well-developed and well-nourished.  HENT:  Head: Normocephalic and atraumatic.  Eyes: Conjunctivae and EOM are normal.  Neck: Normal range of motion. Neck supple. No JVD present. Carotid bruit is not present. No thyromegaly present.  Cardiovascular: Normal rate, regular rhythm and normal heart sounds.  No murmur heard. Pulmonary/Chest: Effort normal and breath sounds normal. No respiratory distress. She has no wheezes. She has no rales. She exhibits no tenderness.  Musculoskeletal: She exhibits no edema.  Neurological: She is alert and oriented to person, place, and time.  Psychiatric: She has a normal mood and affect.  Nursing note and vitals reviewed.  BP 120/63 (BP Location: Right Arm, Cuff Size: Large)   Pulse 70   Temp 98.3 F (36.8 C) (Oral)   Resp 16   Ht 5' 3"  (1.6 m)   Wt 200 lb 12.8 oz (91.1 kg)   SpO2 100%   BMI 35.57 kg/m  Wt Readings from Last 3 Encounters:  05/16/18 200 lb (90.7 kg)  05/16/18 200 lb 12.8 oz (91.1 kg)  08/27/17 193 lb 6.4 oz (87.7 kg)     Lab Results  Component Value Date   WBC 10.4 07/09/2017  HGB 12.3 07/09/2017   HCT 38.7 07/09/2017   PLT 278.0 07/09/2017   GLUCOSE 102 (H) 08/27/2017   CHOL 175 08/27/2017   TRIG 78.0 08/27/2017   HDL 60.20 08/27/2017   LDLCALC 99 08/27/2017   ALT 14 08/27/2017   AST 15 08/27/2017   NA 141 08/27/2017   K 4.6 08/27/2017   CL 102 08/27/2017   CREATININE 0.72 08/27/2017   BUN 11 08/27/2017   CO2 31 08/27/2017   TSH 0.86 04/07/2011   MICROALBUR 1.5  08/27/2017    Dg Chest 2 View  Result Date: 07/09/2017 CLINICAL DATA:  72 year old female with sore throat fever and cough this week. Nasal congestion and runny nose. Lymphoma diagnosed in 2005. EXAM: CHEST  2 VIEW COMPARISON:  Chest CT 03/10/2011 and earlier. Chest radiographs 09/27/2006. FINDINGS: Lung volumes are stable since 2,008 and within normal limits. Cardiac size is stable at the upper limits of normal. Stable mild tortuosity of the thoracic aorta. Other mediastinal contours are within normal limits. Visualized tracheal air column is within normal limits. No pneumothorax, pulmonary edema, pleural effusion or confluent pulmonary opacity. Mild scoliosis and degenerative changes in the spine. No acute osseous abnormality identified. Negative visible bowel gas pattern. IMPRESSION: No acute cardiopulmonary abnormality. Electronically Signed   By: Genevie Ann M.D.   On: 07/09/2017 10:44     Assessment & Plan:  Plan  I have discontinued Florella T. Heitman's doxycycline. I am also having her maintain her labetalol, amLODipine, and mometasone.  No orders of the defined types were placed in this encounter.   Problem List Items Addressed This Visit      Unprioritized   Essential hypertension    Well controlled, no changes to meds. Encouraged heart healthy diet such as the DASH diet and exercise as tolerated.       Relevant Orders   Comprehensive metabolic panel   Lipid panel   Hyperlipidemia LDL goal <100 - Primary    Encouraged heart healthy diet, increase exercise, avoid trans fats, consider a krill oil cap daily      Relevant Orders   Comprehensive metabolic panel   Lipid panel    Other Visit Diagnoses    Influenza vaccine administered       Relevant Orders   Flu vaccine HIGH DOSE PF (Fluzone High dose) (Completed)   Morbid obesity ([place])       Relevant Orders   Amb Ref to Medical Weight Management      Follow-up: Return in about 6 months (around 11/14/2018) for hypertension,  hyperlipidemia.  [name], DO

## 2018-05-16 NOTE — Progress Notes (Signed)
Reviewed  Yvonne R Lowne Chase, DO  

## 2018-06-27 ENCOUNTER — Ambulatory Visit (INDEPENDENT_AMBULATORY_CARE_PROVIDER_SITE_OTHER): Payer: Medicare Other | Admitting: Family

## 2018-06-27 ENCOUNTER — Ambulatory Visit (HOSPITAL_BASED_OUTPATIENT_CLINIC_OR_DEPARTMENT_OTHER)
Admission: RE | Admit: 2018-06-27 | Discharge: 2018-06-27 | Disposition: A | Discharge status: Home / Self Care | Payer: Medicare Other | Source: Ambulatory Visit | Attending: Family | Admitting: Family

## 2018-06-27 ENCOUNTER — Encounter: Payer: Self-pay | Admitting: Family

## 2018-06-27 VITALS — BP 122/60 | HR 70 | Temp 98.1°F | Resp 16 | Ht 63.0 in | Wt 201.0 lb

## 2018-06-27 DIAGNOSIS — R0989 Other specified symptoms and signs involving the circulatory and respiratory systems: Secondary | ICD-10-CM | POA: Diagnosis not present

## 2018-06-27 DIAGNOSIS — M545 Low back pain, unspecified: Secondary | ICD-10-CM

## 2018-06-27 DIAGNOSIS — J40 Bronchitis, not specified as acute or chronic: Secondary | ICD-10-CM | POA: Diagnosis not present

## 2018-06-27 DIAGNOSIS — M25551 Pain in right hip: Secondary | ICD-10-CM | POA: Diagnosis not present

## 2018-06-27 DIAGNOSIS — M1611 Unilateral primary osteoarthritis, right hip: Secondary | ICD-10-CM | POA: Diagnosis not present

## 2018-06-27 MED ORDER — AZITHROMYCIN 250 MG PO TABS: ORAL_TABLET | ORAL | 0 refills | Status: DC

## 2018-06-27 MED ORDER — MELOXICAM 7.5 MG PO TABS: 7.5000 mg | ORAL_TABLET | Freq: Every day | ORAL | 0 refills | Status: DC

## 2018-06-27 NOTE — Addendum Note (Signed)
Addended by: Debbrah Alar on: 06/27/2018 10:40 AM   Modules accepted: Orders

## 2018-06-27 NOTE — Progress Notes (Signed)
Subjective:    Patient ID: Candice Bennett, female    DOB: 1946-05-12, 73 y.o.   MRN: [id number]  HPI  Patient is a 73 year old female with hx of HTN, sarcoidosis, OSA, and lymphoma (dx 2005 treated with chemo) who presents today with 2 concerns:  1) cough- patient reports cough and chest congestion which is been present for about 6 weeks.  Cough is productive of phlegm. Denies fever but has had general fatigue. Using otc meds without improvement in her symptoms. She reports that she notes some improvement in the last 2 days.  2) right-sided low back pain- reports that this hurts with cough.  Has to hold the area when she coughs.    Review of Systems See HPI  Past Medical History:  Diagnosis Date  . Follicular lymphoma ([place])   . OSA (obstructive sleep apnea)   . Sarcoidosis   . Unspecified essential hypertension      Social History   Socioeconomic History  . Marital status: Divorced    Spouse name: Not on file  . Number of children: 2  . Years of education: Not on file  . Highest education level: Not on file  Occupational History  . Occupation: [employer]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[place]  . Financial resource strain: Not on file  . Food insecurity:    Worry: Not on file    Inability: Not on file  . Transportation needs:    Medical: Not on file    Non-medical: Not on file  Tobacco Use  . Smoking status: Never Smoker  . Smokeless tobacco: Never Used  Substance and Sexual Activity  . Alcohol use: No  . Drug use: No  . Sexual activity: Not Currently    Partners: Male  Lifestyle  . Physical activity:    Days per week: Not on file    Minutes per session: Not on file  . Stress: Not on file  Relationships  . Social connections:    Talks on phone: Not on file    Gets together: Not on file    Attends religious service: Not on file    Active member of club or organization: Not on file    Attends meetings of clubs or organizations:  Not on file    Relationship status: Not on file  . Intimate partner violence:    Fear of current or ex partner: Not on file    Emotionally abused: Not on file    Physically abused: Not on file    Forced sexual activity: Not on file  Other Topics Concern  . Not on file  Social History Narrative   No regularly exercise   Live with daughter and her daughter    Past Surgical History:  Procedure Laterality Date  . ABDOMINAL HYSTERECTOMY  1983  . CARPAL TUNNEL RELEASE    . LYMPH NODE BIOPSY    . port a cath insertion  2005  . PORT-A-CATH REMOVAL  2006  . TUBAL LIGATION  1978    Family History  Problem Relation Age of Onset  . Lung cancer Father   . Heart attack Father   . Diabetes Brother   . Colon cancer Brother   . Heart disease Mother   . Heart disease Sister 55       chf  . Dementia Sister   . Kidney disease Sister   . Hypertension Sister   . Multiple myeloma Sister   .  Bell's palsy Sister   . Heart attack Sister 38  . Diabetes Sister     Allergies  Allergen Reactions  . Iohexol Itching  . Ivp Dye [Iodinated Diagnostic Agents] Itching    Current Outpatient Medications on File Prior to Visit  Medication Sig Dispense Refill  . amLODipine (NORVASC) 5 MG tablet Take 1 tablet (5 mg total) by mouth daily. 90 tablet 1  . labetalol (NORMODYNE) 100 MG tablet Take 1 tablet (100 mg total) by mouth 2 (two) times daily. 180 tablet 3  . mometasone (ELOCON) 0.1 % cream APPLY  CREAM TOPICALLY TO AFFECTED AREA(S) ONCE DAILY 45 g 1   No current facility-administered medications on file prior to visit.     BP 122/60 (BP Location: Right Arm, Patient Position: Sitting, Cuff Size: Large)   Pulse 70   Temp 98.1 F (36.7 C) (Oral)   Resp 16   Ht 5' 3"  (1.6 m)   Wt 201 lb (91.2 kg)   SpO2 100%   BMI 35.61 kg/m       Objective:   Physical Exam Constitutional:      Appearance: She is well-developed.  HENT:     Right Ear: Tympanic membrane and ear canal normal.     Left  Ear: Tympanic membrane and ear canal normal.     Mouth/Throat:     Pharynx: Oropharynx is clear. No pharyngeal swelling, oropharyngeal exudate or posterior oropharyngeal erythema.  Neck:     Musculoskeletal: Neck supple.     Thyroid: No thyromegaly.  Cardiovascular:     Rate and Rhythm: Normal rate and regular rhythm.     Heart sounds: Normal heart sounds. No murmur.  Pulmonary:     Effort: Pulmonary effort is normal. No respiratory distress.     Breath sounds: Normal breath sounds. No wheezing.  Lymphadenopathy:     Cervical: No cervical adenopathy.  Skin:    General: Skin is warm and dry.  Neurological:     Mental Status: She is alert and oriented to person, place, and time.  Psychiatric:        Behavior: Behavior normal.        Thought Content: Thought content normal.        Judgment: Judgment normal.           Assessment & Plan:  Bronchitis- rx with zpak, mucinex prn. Obtain CXR to rule out PNA.  Consider pulmonary referral if symptoms fail to improve given hx of sarcoidosis.  Low back pain- rx with meloxicam. Exacerbated by recent coughing

## 2018-06-27 NOTE — Patient Instructions (Signed)
Please complete chest x-ray on the first  Floor. Start zpak (antibiotic) for bronchitis. You may use mucinex as needed for congestion. Begin meloxicam once daily as needed for low back pain. Call if symptoms worsen or if not improved in 3-4 days.

## 2018-06-28 ENCOUNTER — Ambulatory Visit: Payer: Self-pay | Admitting: *Deleted

## 2018-06-28 MED ORDER — METHYLPREDNISOLONE 4 MG PO TBPK: ORAL_TABLET | ORAL | 0 refills | Status: DC

## 2018-06-28 NOTE — Telephone Encounter (Signed)
fyi

## 2018-06-28 NOTE — Telephone Encounter (Signed)
I sent an rx for medrol dose pak she can begin. If this does not improve her pain in 3-4 days please let us know.

## 2018-06-28 NOTE — Telephone Encounter (Signed)
Pt seen yesterday by M. O'Sullivan, Bronchitis, low back pain. States meloxicam has not helped relieve pain. States 5/10 when lying still, 12/10 if coughing. Pt requesting something for pain; states "I'll try something OTC if needed." Requesting advise on what to take in addition to meloxicam, "OTC med or something else for pain be called in."  Please advise: [phone]  Reason for Disposition . Caller has NON-URGENT medication question about med that PCP prescribed and triager unable to answer question  Answer Assessment - Initial Assessment Questions 1. SYMPTOMS: "Do you have any symptoms?"     BAck pain 2. SEVERITY: If symptoms are present, ask "Are they mild, moderate or severe?"     Yes,varies  Protocols used: MEDICATION QUESTION CALL-A-AH

## 2018-06-28 NOTE — Telephone Encounter (Signed)
Please advise 

## 2018-06-28 NOTE — Telephone Encounter (Signed)
Patient advised of new rx and to call if not better in 3-4 days

## 2018-07-18 DIAGNOSIS — H43393 Other vitreous opacities, bilateral: Secondary | ICD-10-CM | POA: Diagnosis not present

## 2018-07-18 DIAGNOSIS — H35373 Puckering of macula, bilateral: Secondary | ICD-10-CM | POA: Diagnosis not present

## 2018-07-18 DIAGNOSIS — H2513 Age-related nuclear cataract, bilateral: Secondary | ICD-10-CM | POA: Diagnosis not present

## 2018-08-29 ENCOUNTER — Other Ambulatory Visit: Payer: Self-pay | Admitting: Family Medicine

## 2018-08-29 DIAGNOSIS — I10 Essential (primary) hypertension: Secondary | ICD-10-CM

## 2018-11-08 ENCOUNTER — Other Ambulatory Visit: Payer: Self-pay | Admitting: *Deleted

## 2018-11-08 DIAGNOSIS — I10 Essential (primary) hypertension: Secondary | ICD-10-CM

## 2018-11-08 MED ORDER — LABETALOL HCL 100 MG PO TABS: 100.0000 mg | ORAL_TABLET | Freq: Two times a day (BID) | ORAL | 0 refills | Status: DC

## 2018-11-15 ENCOUNTER — Ambulatory Visit (INDEPENDENT_AMBULATORY_CARE_PROVIDER_SITE_OTHER): Payer: Medicare Other | Admitting: Family Medicine

## 2018-11-15 ENCOUNTER — Other Ambulatory Visit: Payer: Self-pay

## 2018-11-15 ENCOUNTER — Encounter: Payer: Self-pay | Admitting: Family Medicine

## 2018-11-15 VITALS — BP 132/79 | HR 72 | Temp 97.3°F | Wt 200.0 lb

## 2018-11-15 DIAGNOSIS — E785 Hyperlipidemia, unspecified: Secondary | ICD-10-CM

## 2018-11-15 DIAGNOSIS — M778 Other enthesopathies, not elsewhere classified: Secondary | ICD-10-CM | POA: Diagnosis not present

## 2018-11-15 DIAGNOSIS — I10 Essential (primary) hypertension: Secondary | ICD-10-CM

## 2018-11-15 NOTE — Progress Notes (Signed)
Virtual Visit via Video Note  I connected with Candice Bennett on 11/15/18 at 10:15 AM EDT by a video enabled telemedicine application and verified that I am speaking with the correct person using two identifiers.  Location: Patient: home Provider: office    I discussed the limitations of evaluation and management by telemedicine and the availability of in person appointments. The patient expressed understanding and agreed to proceed.  History of Present Illness: Pt is home and needs f/u bp and c/o pain in L arm/ elbow esp with walking with hand weights and if she has to lift something No other complaints     Past Medical History:  Diagnosis Date  . Follicular lymphoma ([place])   . OSA (obstructive sleep apnea)   . Sarcoidosis   . Unspecified essential hypertension    Current Outpatient Medications on File Prior to Visit  Medication Sig Dispense Refill  . amLODipine (NORVASC) 5 MG tablet Take 1 tablet by mouth once daily 90 tablet 0  . labetalol (NORMODYNE) 100 MG tablet Take 1 tablet (100 mg total) by mouth 2 (two) times daily. 180 tablet 0  . mometasone (ELOCON) 0.1 % cream APPLY  CREAM TOPICALLY TO AFFECTED AREA(S) ONCE DAILY 45 g 1   No current facility-administered medications on file prior to visit.     Observations/Objective: Vitals:   11/15/18 1000 11/15/18 1001  BP: 127/77 132/79  Pulse: 71 72  Temp: (!) 97.3 F (36.3 C)    Pt in NAD  c/o L elbow pain -- lat epicondyle area  No swelling   Assessment and Plan: 1. Essential hypertension Well controlled, no changes to meds. Encouraged heart healthy diet such as the DASH diet and exercise as tolerated.    2. Left elbow tendonitis Do not walk with the weights for now Ice the elbow at night  Rest  Consider sport med/ ortho if no better   3. Hyperlipidemia LDL goal <100 Tolerating statin, encouraged heart healthy diet, avoid trans fats, minimize simple carbs and saturated fats. Increase exercise as  tolerated   Follow Up Instructions:    I discussed the assessment and treatment plan with the patient. The patient was provided an opportunity to ask questions and all were answered. The patient agreed with the plan and demonstrated an understanding of the instructions.   The patient was advised to call back or seek an in-person evaluation if the symptoms worsen or if the condition fails to improve as anticipated.  I provided 25 minutes of non-face-to-face time during this encounter.   [name], DO

## 2018-11-15 NOTE — Assessment & Plan Note (Signed)
Tolerating statin, encouraged heart healthy diet, avoid trans fats, minimize simple carbs and saturated fats. Increase exercise as tolerated 

## 2018-12-11 ENCOUNTER — Other Ambulatory Visit: Payer: Self-pay | Admitting: Family Medicine

## 2018-12-11 DIAGNOSIS — I10 Essential (primary) hypertension: Secondary | ICD-10-CM

## 2019-03-09 ENCOUNTER — Other Ambulatory Visit: Payer: Self-pay

## 2019-03-09 ENCOUNTER — Ambulatory Visit (INDEPENDENT_AMBULATORY_CARE_PROVIDER_SITE_OTHER): Payer: Medicare Other

## 2019-03-09 DIAGNOSIS — I10 Essential (primary) hypertension: Secondary | ICD-10-CM

## 2019-03-09 DIAGNOSIS — Z23 Encounter for immunization: Secondary | ICD-10-CM

## 2019-03-09 MED ORDER — LABETALOL HCL 100 MG PO TABS
100.0000 mg | ORAL_TABLET | Freq: Two times a day (BID) | ORAL | 0 refills | Status: DC
Start: 2019-03-09 — End: 2019-06-07

## 2019-03-09 NOTE — Progress Notes (Signed)
Flu shot given to patient w/o any complications 

## 2019-04-18 ENCOUNTER — Other Ambulatory Visit: Payer: Self-pay | Admitting: Family Medicine

## 2019-04-18 ENCOUNTER — Telehealth: Payer: Self-pay | Admitting: Family Medicine

## 2019-04-18 DIAGNOSIS — Z1231 Encounter for screening mammogram for malignant neoplasm of breast: Secondary | ICD-10-CM

## 2019-04-18 NOTE — Telephone Encounter (Signed)
Patient states that she does not feel a lump in her left breast.  She lifted here left arm and felt a catch and now wants left breast checked.  Appointment scheduled for Thursday afternoon.

## 2019-04-18 NOTE — Telephone Encounter (Signed)
Copied from [place] [phone]. Topic: General - Other >> Apr 18, 2019  8:42 AM Keene Breath wrote: Reason for CRM: Patient called to ask the nurse to call her regarding getting a mammogram.  Please advise and call patient to discuss.  CB# [phone]

## 2019-04-18 NOTE — Telephone Encounter (Signed)
Ok to place but she does not need one if its a routine mammogram

## 2019-04-18 NOTE — Telephone Encounter (Signed)
Pt last seen on 11/15/2018 and last mammogram done in 2017. Can I place referral? Please advise

## 2019-04-19 ENCOUNTER — Other Ambulatory Visit: Payer: Self-pay

## 2019-04-20 ENCOUNTER — Ambulatory Visit (INDEPENDENT_AMBULATORY_CARE_PROVIDER_SITE_OTHER): Payer: Medicare Other | Admitting: Family Medicine

## 2019-04-20 ENCOUNTER — Encounter: Payer: Self-pay | Admitting: Family Medicine

## 2019-04-20 ENCOUNTER — Other Ambulatory Visit (HOSPITAL_COMMUNITY)
Admission: RE | Admit: 2019-04-20 | Discharge: 2019-04-20 | Disposition: A | Discharge status: Home / Self Care | Payer: Medicare Other | Source: Ambulatory Visit | Attending: Family Medicine | Admitting: Family Medicine

## 2019-04-20 VITALS — BP 118/70 | HR 79 | Temp 97.2°F | Resp 18 | Ht 63.0 in | Wt 197.2 lb

## 2019-04-20 DIAGNOSIS — L309 Dermatitis, unspecified: Secondary | ICD-10-CM

## 2019-04-20 DIAGNOSIS — M25512 Pain in left shoulder: Secondary | ICD-10-CM | POA: Diagnosis not present

## 2019-04-20 DIAGNOSIS — B354 Tinea corporis: Secondary | ICD-10-CM

## 2019-04-20 DIAGNOSIS — R8290 Unspecified abnormal findings in urine: Secondary | ICD-10-CM

## 2019-04-20 DIAGNOSIS — N760 Acute vaginitis: Secondary | ICD-10-CM

## 2019-04-20 DIAGNOSIS — N644 Mastodynia: Secondary | ICD-10-CM | POA: Diagnosis not present

## 2019-04-20 LAB — POCT URINALYSIS DIPSTICK OB
Bilirubin, UA: NEGATIVE
Blood, UA: NEGATIVE
Glucose, UA: NEGATIVE
Ketones, UA: NEGATIVE
Leukocytes, UA: NEGATIVE
Nitrite, UA: NEGATIVE
POC,PROTEIN,UA: NEGATIVE
Spec Grav, UA: 1.015 (ref 1.010–1.025)
Urobilinogen, UA: 0.2 E.U./dL
pH, UA: 7 (ref 5.0–8.0)

## 2019-04-20 MED ORDER — MOMETASONE FUROATE 0.1 % EX CREA: TOPICAL_CREAM | CUTANEOUS | 1 refills | Status: DC

## 2019-04-20 MED ORDER — NYSTATIN 100000 UNIT/GM EX POWD: Freq: Four times a day (QID) | CUTANEOUS | 0 refills | Status: DC

## 2019-04-20 NOTE — Assessment & Plan Note (Signed)
Only bothers her on occasion  if worsens-- we will refer to sport med/ vs ortho

## 2019-04-20 NOTE — Assessment & Plan Note (Addendum)
Diagnostic mammogram  ekg -- no change from previous one  If chest pain returns--- go to ER

## 2019-04-20 NOTE — Assessment & Plan Note (Signed)
Check urine ancillary Did refill nystop for tinea pt has in folds of skin

## 2019-04-20 NOTE — Patient Instructions (Signed)
Shoulder Pain Many things can cause shoulder pain, including:  An injury to the shoulder.  Overuse of the shoulder.  Arthritis. The source of the pain can be:  Inflammation.  An injury to the shoulder joint.  An injury to a tendon, ligament, or bone. Follow these instructions at home: Pay attention to changes in your symptoms. Let your health care provider know about them. Follow these instructions to relieve your pain. If you have a sling:  Wear the sling as told by your health care provider. Remove it only as told by your health care provider.  Loosen the sling if your fingers tingle, become numb, or turn cold and blue.  Keep the sling clean.  If the sling is not waterproof: ? Do not let it get wet. Remove it to shower or bathe.  Move your arm as little as possible, but keep your hand moving to prevent swelling. Managing pain, stiffness, and swelling   If directed, put ice on the painful area: ? Put ice in a plastic bag. ? Place a towel between your skin and the bag. ? Leave the ice on for 20 minutes, 2-3 times per day. Stop applying ice if it does not help with the pain.  Squeeze a soft ball or a foam pad as much as possible. This helps to keep the shoulder from swelling. It also helps to strengthen the arm. General instructions  Take over-the-counter and prescription medicines only as told by your health care provider.  Keep all follow-up visits as told by your health care provider. This is important. Contact a health care provider if:  Your pain gets worse.  Your pain is not relieved with medicines.  New pain develops in your arm, hand, or fingers. Get help right away if:  Your arm, hand, or fingers: ? Tingle. ? Become numb. ? Become swollen. ? Become painful. ? Turn white or blue. Summary  Shoulder pain can be caused by an injury, overuse, or arthritis.  Pay attention to changes in your symptoms. Let your health care provider know about them.   This condition may be treated with a sling, ice, and pain medicines.  Contact your health care provider if the pain gets worse or new pain develops. Get help right away if your arm, hand, or fingers tingle or become numb, swollen, or painful.  Keep all follow-up visits as told by your health care provider. This is important. This information is not intended to replace advice given to you by your health care provider. Make sure you discuss any questions you have with your health care provider. Document Released: 03/18/2005 Document Revised: 12/21/2017 Document Reviewed: 12/21/2017 Elsevier Patient Education  2020 Elsevier Inc.  

## 2019-04-20 NOTE — Progress Notes (Signed)
Patient ID: Candice Bennett, female    DOB: July 02, 1945  Age: 73 y.o. MRN: [id number]    Subjective:  Subjective  HPI OMOLOLA Bennett presents for L shoulder pain--- her shoulder would catch when she reached for something --- it occurs on occasion    The last time it occurred it radiated to her L breast so she was concerned about her breast and her heart She also c/o vaginal itch--- no d/c or odor   Review of Systems  Constitutional: Negative for appetite change, diaphoresis, fatigue and unexpected weight change.  Eyes: Negative for pain, redness and visual disturbance.  Respiratory: Negative for cough, chest tightness, shortness of breath and wheezing.   Cardiovascular: Positive for chest pain. Negative for palpitations and leg swelling.  Endocrine: Negative for cold intolerance, heat intolerance, polydipsia, polyphagia and polyuria.  Genitourinary: Negative for difficulty urinating, dysuria and frequency.  Musculoskeletal: Positive for arthralgias.  Neurological: Negative for dizziness, light-headedness, numbness and headaches.    History Past Medical History:  Diagnosis Date   Follicular lymphoma ([place])    OSA (obstructive sleep apnea)    Sarcoidosis    Unspecified essential hypertension     She has a past surgical history that includes Carpal tunnel release; Tubal ligation (1978); Lymph node biopsy; Abdominal hysterectomy (1983); port a cath insertion (2005); and Port-a-cath removal (2006).   Her family history includes Bell's palsy in her sister; Colon cancer in her brother; Dementia in her sister; Diabetes in her brother and sister; Heart attack in her father; Heart attack (age of onset: 30) in her sister; Heart disease in her mother; Heart disease (age of onset: 23) in her sister; Hypertension in her sister; Kidney disease in her sister; Lung cancer in her father; Multiple myeloma in her sister.She reports that she has never smoked. She has never used smokeless tobacco. She  reports that she does not drink alcohol or use drugs.  Current Outpatient Medications on File Prior to Visit  Medication Sig Dispense Refill   amLODipine (NORVASC) 5 MG tablet Take 1 tablet (5 mg total) by mouth daily. 90 tablet 1   labetalol (NORMODYNE) 100 MG tablet Take 1 tablet (100 mg total) by mouth 2 (two) times daily. 180 tablet 0   No current facility-administered medications on file prior to visit.      Objective:  Objective  Physical Exam Vitals signs and nursing note reviewed.  Constitutional:      Appearance: She is well-developed.  HENT:     Head: Normocephalic and atraumatic.  Eyes:     Conjunctiva/sclera: Conjunctivae normal.  Neck:     Musculoskeletal: Normal range of motion and neck supple.     Thyroid: No thyromegaly.     Vascular: No carotid bruit or JVD.  Cardiovascular:     Rate and Rhythm: Normal rate and regular rhythm.     Heart sounds: Normal heart sounds. No murmur.  Pulmonary:     Effort: Pulmonary effort is normal. No respiratory distress.     Breath sounds: Normal breath sounds. No wheezing or rales.  Chest:     Chest wall: No tenderness.  Skin:    Findings: Rash present.  Neurological:     Mental Status: She is alert and oriented to person, place, and time.    BP 118/70 (BP Location: Right Arm, Patient Position: Sitting, Cuff Size: Large)    Pulse 79    Temp (!) 97.2 F (36.2 C) (Temporal)    Resp 18    Ht 5'  3" (1.6 m)    Wt 197 lb 3.2 oz (89.4 kg)    SpO2 94%    BMI 34.93 kg/m  Wt Readings from Last 3 Encounters:  04/20/19 197 lb 3.2 oz (89.4 kg)  11/15/18 200 lb (90.7 kg)  06/27/18 201 lb (91.2 kg)     Lab Results  Component Value Date   WBC 10.4 07/09/2017   HGB 12.3 07/09/2017   HCT 38.7 07/09/2017   PLT 278.0 07/09/2017   GLUCOSE 100 (H) 05/16/2018   CHOL 187 05/16/2018   TRIG 71.0 05/16/2018   HDL 67.70 05/16/2018   LDLCALC 105 (H) 05/16/2018   ALT 15 05/16/2018   AST 16 05/16/2018   NA 141 05/16/2018   K 4.5  05/16/2018   CL 104 05/16/2018   CREATININE 0.75 05/16/2018   BUN 11 05/16/2018   CO2 30 05/16/2018   TSH 0.86 04/07/2011   MICROALBUR 1.5 08/27/2017    Dg Chest 2 View  Result Date: 06/27/2018 CLINICAL DATA:  Cough and congestion for several weeks EXAM: CHEST - 2 VIEW COMPARISON:  07/09/2017 FINDINGS: Cardiac shadow is stable. Mild aortic calcifications are seen. The lungs are well aerated bilaterally. No focal infiltrate or effusion is seen. Degenerative changes of the thoracic spine are noted. IMPRESSION: No active disease. Electronically Signed   By: Inez Catalina M.D.   On: 06/27/2018 14:11   Dg Hip Unilat W Or W/o Pelvis 2-3 Views Right  Result Date: 06/27/2018 CLINICAL DATA:  73 year old female with right hip pain stiffness for the past week. No known injury. History of sarcoidosis. Initial encounter. EXAM: DG HIP (WITH OR WITHOUT PELVIS) 2-3V RIGHT COMPARISON:  None. FINDINGS: Mild bilateral hip joint degenerative changes with small subchondral cyst superolateral aspect of the acetabulum bilaterally. No evidence of right hip fracture or dislocation. No plain film evidence of right femoral head avascular necrosis. Sacroiliac joints appear intact. Mild degenerative changes pubic symphysis. Mild degenerative changes lower lumbar spine. No plain film evidence of sarcoid involvement of osseous structures. IMPRESSION: 1. Mild bilateral hip joint degenerative changes with small subchondral cyst superolateral aspect acetabulum bilaterally. 2. Mild degenerative changes L4-5. Electronically Signed   By: Genia Del M.D.   On: 06/27/2018 11:51     Assessment & Plan:  Plan  I am having Konnie T. Siller start on nystatin. I am also having her maintain her amLODipine, labetalol, and mometasone.  Meds ordered this encounter  Medications   mometasone (ELOCON) 0.1 % cream    Sig: APPLY  CREAM TOPICALLY TO AFFECTED AREA(S) ONCE DAILY    Dispense:  45 g    Refill:  1    Please consider 90 day  supplies to promote better adherence   nystatin (NYSTATIN) powder    Sig: Apply topically 4 (four) times daily.    Dispense:  15 g    Refill:  0    Problem List Items Addressed This Visit      Unprioritized   Acute pain of left shoulder    Only bothers her on occasion  if worsens-- we will refer to sport med/ vs ortho      Acute vaginitis    Check urine ancillary Did refill nystop for tinea pt has in folds of skin       Breast pain, left - Primary    Diagnostic mammogram  ekg -- no change from previous one  If chest pain returns--- go to ER       Relevant Orders   MM  Digital Diagnostic Bilat   US BREAST COMPLETE UNI LEFT INC AXILLA   EKG 12-Lead (Completed)   Tinea corporis   Relevant Medications   nystatin (NYSTATIN) powder    Other Visit Diagnoses    Abnormal urine       Relevant Orders   POC Urinalysis Dipstick OB (Completed)   Urine cytology ancillary only([place])   Eczema, unspecified type       Relevant Medications   mometasone (ELOCON) 0.1 % cream      Follow-up: Return if symptoms worsen or fail to improve.  [name], DO

## 2019-04-21 ENCOUNTER — Other Ambulatory Visit: Payer: Self-pay | Admitting: Family Medicine

## 2019-04-21 DIAGNOSIS — N6320 Unspecified lump in the left breast, unspecified quadrant: Secondary | ICD-10-CM

## 2019-04-21 DIAGNOSIS — N644 Mastodynia: Secondary | ICD-10-CM

## 2019-04-26 ENCOUNTER — Ambulatory Visit
Admission: RE | Admit: 2019-04-26 | Discharge: 2019-04-26 | Disposition: A | Discharge status: Home / Self Care | Payer: Medicare Other | Source: Ambulatory Visit | Attending: Family Medicine | Admitting: Family Medicine

## 2019-04-26 ENCOUNTER — Ambulatory Visit: Payer: Medicare Other

## 2019-04-26 ENCOUNTER — Other Ambulatory Visit: Payer: Self-pay

## 2019-04-26 DIAGNOSIS — R928 Other abnormal and inconclusive findings on diagnostic imaging of breast: Secondary | ICD-10-CM | POA: Diagnosis not present

## 2019-04-26 DIAGNOSIS — N644 Mastodynia: Secondary | ICD-10-CM

## 2019-04-28 LAB — URINE CYTOLOGY ANCILLARY ONLY
Bacterial Vaginitis-Urine: NEGATIVE
Candida Urine: NEGATIVE
Chlamydia: NEGATIVE
Comment: NEGATIVE
Comment: NEGATIVE
Comment: NORMAL
Neisseria Gonorrhea: NEGATIVE
Trichomonas: NEGATIVE

## 2019-05-16 NOTE — Progress Notes (Signed)
Subjective:   Candice Bennett is a 73 y.o. female who presents for Medicare Annual (Subsequent) preventive examination.  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   Review of Systems:  Cardiac Risk Factors include: advanced age (>20mn, >>60women);dyslipidemia;hypertension Home Safety/Smoke Alarms: Feels safe in home. Smoke alarms in place.  Lives w/ daughter and granddaughter in 2 story townhouse.   Female:   Mammo-  04/26/19   Dexa scan- ordered       CCS- due 07/2021    Objective:     Vitals: BP 120/68 (BP Location: Left Arm, Patient Position: Sitting, Cuff Size: Large)   Pulse 76   Ht _0  (1.6 m)   Wt 198 lb 12.8 oz (90.2 kg)   SpO2 98%   BMI 35.22 kg/m   Body mass index is 35.22 kg/m.  Advanced Directives 05/22/2019 05/16/2018 05/10/2017 08/18/2016 05/08/2016  Does Patient Have a Medical Advance Directive? _1   Type of AParamedicof AOktahaLiving will HLoganLiving will HClementonLiving will HByronLiving will  Does patient want to make changes to medical advance directive? No - Patient declined - - No - Patient declined -  Copy of HMirandain Chart? Yes - validated most recent copy scanned in chart (See row information) Yes - validated most recent copy scanned in chart (See row information) Yes No - copy requested No - copy requested    Tobacco Social History   Tobacco Use  Smoking Status Never Smoker  Smokeless Tobacco Never Used     Counseling given: Not Answered   Clinical Intake: Pain : No/denies pain     Past Medical History:  Diagnosis Date  . Follicular lymphoma ([place]   . OSA (obstructive sleep apnea)   . Sarcoidosis   . Unspecified essential hypertension    Past Surgical History:  Procedure Laterality Date  . ABDOMINAL HYSTERECTOMY  1983  . CARPAL TUNNEL RELEASE    . LYMPH  NODE BIOPSY    . port a cath insertion  2005  . PORT-A-CATH REMOVAL  2006  . TUBAL LIGATION  1978   Family History  Problem Relation Age of Onset  . Lung cancer Father   . Heart attack Father   . Diabetes Brother   . Colon cancer Brother   . Heart disease Mother   . Heart disease Sister [id number]      chf  . Dementia Sister   . Kidney disease Sister   . Hypertension Sister   . Multiple myeloma Sister   . Bell's palsy Sister   . Heart attack Sister [phone] . Diabetes Sister    Social History   Socioeconomic History  . Marital status: Divorced    Spouse name: Not on file  . Number of children: 2  . Years of education: Not on file  . Highest education level: Not on file  Occupational History  . 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 Financial resource strain: Not on file  . Food insecurity    Worry: Not on file    Inability: Not on file  . Transportation needs    Medical: Not on file    Non-medical: Not on file  Tobacco Use  . Smoking status: Never Smoker  . Smokeless tobacco: Never Used  Substance and Sexual Activity  .  Alcohol use: No  . Drug use: No  . Sexual activity: Not Currently    Partners: Male  Lifestyle  . Physical activity    Days per week: Not on file    Minutes per session: Not on file  . Stress: Not on file  Relationships  . Social Herbalist on phone: Not on file    Gets together: Not on file    Attends religious service: Not on file    Active member of club or organization: Not on file    Attends meetings of clubs or organizations: Not on file    Relationship status: Not on file  Other Topics Concern  . Not on file  Social History Narrative   No regularly exercise   Live with daughter and her daughter    Outpatient Encounter Medications as of 05/22/2019  Medication Sig  . amLODipine (NORVASC) 5 MG tablet Take 1 tablet (5 mg total) by mouth daily.  Marland Kitchen labetalol (NORMODYNE) 100 MG  tablet Take 1 tablet (100 mg total) by mouth 2 (two) times daily.  . mometasone (ELOCON) 0.1 % cream APPLY  CREAM TOPICALLY TO AFFECTED AREA(S) ONCE DAILY  . nystatin (NYSTATIN) powder Apply topically 4 (four) times daily.   No facility-administered encounter medications on file as of 05/22/2019.     Activities of Daily Living In your present state of health, do you have any difficulty performing the following activities: 05/22/2019  Hearing? N  Vision? N  Difficulty concentrating or making decisions? N  Walking or climbing stairs? N  Dressing or bathing? N  Doing errands, shopping? N  Preparing Food and eating ? N  Using the Toilet? N  In the past six months, have you accidently leaked urine? N  Do you have problems with loss of bowel control? N  Managing your Medications? N  Managing your Finances? N  Housekeeping or managing your Housekeeping? N  Some recent data might be hidden    Patient Care Team: Carollee Herter, Alferd Apa, DO as PCP - General Inda Castle, MD (Inactive) as Consulting Physician (Gastroenterology)    Assessment:   This is a routine wellness examination for Candice Bennett. Physical assessment deferred to PCP.  Exercise Activities and Dietary recommendations Current Exercise Habits: The patient does not participate in regular exercise at present, Exercise limited by: None identified Diet (meal preparation, eat out, water intake, caffeinated beverages, dairy products, fruits and vegetables): in general, an "unhealthy" diet   Goals    . Decrease soda or juice intake    . Drink 4 glasses of water per day (pt-stated)    . Exercise 2x per week (30 min per time)    . Increase physical activity    . Limits sweets to 2 per day (pt-stated)    . Reduce fast food intake    . Weight (lb) < 170 lb (77.1 kg)       Fall Risk Fall Risk  05/22/2019 05/16/2018 05/10/2017 05/08/2016 08/08/2015  Falls in the past year? 0 0 No No No  Number falls in past yr: 0 - - - -   Injury with Fall? 0 - - - -  Risk for fall due to : - - - - -  Risk for fall due to: Comment - - - - -  Follow up Education provided;Falls prevention discussed - - - -    Depression Screen PHQ 2/9 Scores 05/22/2019 05/16/2018 05/16/2018 05/10/2017  PHQ - 2 Score 0 0 0 0  PHQ- 9 Score - - 5 -     Cognitive Function Ad8 score reviewed for issues:  Issues making decisions:no  Less interest in hobbies / activities:no  Repeats questions, stories (family complaining):no  Trouble using ordinary gadgets (microwave, computer, phone):no  Forgets the month or year: no  Mismanaging finances: no  Remembering appts:no  Daily problems with thinking and/or memory:no Ad8 score is=0     MMSE - Mini Mental State Exam 05/10/2017 05/08/2016  Orientation to time 5 5  Orientation to Place 5 5  Registration 3 3  Attention/ Calculation 5 5  Recall 3 3  Language- name 2 objects 2 2  Language- repeat 1 1  Language- follow 3 step command 3 3  Language- read & follow direction 1 1  Write a sentence 1 1  Copy design 1 1  Total score 30 30        Immunization History  Administered Date(s) Administered  . Fluad Quad(high Dose 65+) 03/09/2019  . Influenza Whole 03/26/2009  . Influenza, High Dose Seasonal PF 02/25/2017, 05/16/2018  . Pneumococcal Conjugate-13 01/30/2014  . Pneumococcal Polysaccharide-23 12/09/2012  . Td 08/10/2000  . Tdap 04/07/2011  . Zoster 02/10/2010   Screening Tests Health Maintenance  Topic Date Due  . MAMMOGRAM  04/25/2020  . TETANUS/TDAP  04/06/2021  . COLONOSCOPY  08/19/2021  . INFLUENZA VACCINE  Completed  . DEXA SCAN  Completed  . Hepatitis C Screening  Completed  . PNA vac Low Risk Adult  Completed       Plan:   See you next year!  Continue to eat heart healthy diet (full of fruits, vegetables, whole grains, lean protein, water--limit salt, fat, and sugar intake) and increase physical activity as tolerated.  Continue doing brain stimulating  activities (puzzles, reading, adult coloring books, staying active) to keep memory sharp.     I have personally reviewed and noted the following in the patient's chart:   . Medical and social history . Use of alcohol, tobacco or illicit drugs  . Current medications and supplements . Functional ability and status . Nutritional status . Physical activity . Advanced directives . List of other physicians . Hospitalizations, surgeries, and ER visits in previous 12 months . Vitals . Screenings to include cognitive, depression, and falls . Referrals and appointments  In addition, I have reviewed and discussed with patient certain preventive protocols, quality metrics, and best practice recommendations. A written personalized care plan for preventive services as well as general preventive health recommendations were provided to patient.     Shela Nevin, [place]  05/22/2019

## 2019-05-22 ENCOUNTER — Other Ambulatory Visit: Payer: Self-pay | Admitting: Family Medicine

## 2019-05-22 ENCOUNTER — Other Ambulatory Visit: Payer: Self-pay

## 2019-05-22 ENCOUNTER — Ambulatory Visit (INDEPENDENT_AMBULATORY_CARE_PROVIDER_SITE_OTHER): Payer: Medicare Other | Admitting: *Deleted

## 2019-05-22 ENCOUNTER — Encounter: Payer: Self-pay | Admitting: Family Medicine

## 2019-05-22 ENCOUNTER — Encounter: Payer: Self-pay | Admitting: *Deleted

## 2019-05-22 ENCOUNTER — Ambulatory Visit (INDEPENDENT_AMBULATORY_CARE_PROVIDER_SITE_OTHER): Payer: Medicare Other | Admitting: Family Medicine

## 2019-05-22 VITALS — BP 120/68 | HR 76 | Temp 96.8°F | Resp 12 | Ht 63.0 in | Wt 198.0 lb

## 2019-05-22 VITALS — BP 120/68 | HR 76 | Ht 63.0 in | Wt 198.8 lb

## 2019-05-22 DIAGNOSIS — I10 Essential (primary) hypertension: Secondary | ICD-10-CM

## 2019-05-22 DIAGNOSIS — Z0000 Encounter for general adult medical examination without abnormal findings: Secondary | ICD-10-CM | POA: Diagnosis not present

## 2019-05-22 DIAGNOSIS — N644 Mastodynia: Secondary | ICD-10-CM

## 2019-05-22 DIAGNOSIS — E785 Hyperlipidemia, unspecified: Secondary | ICD-10-CM

## 2019-05-22 DIAGNOSIS — Z780 Asymptomatic menopausal state: Secondary | ICD-10-CM

## 2019-05-22 LAB — LIPID PANEL
Cholesterol: 178 mg/dL (ref 0–200)
HDL: 59.8 mg/dL (ref >39.00)
LDL Cholesterol: 101 mg/dL — ABNORMAL HIGH (ref 0–99)
NonHDL: 118.29
Total CHOL/HDL Ratio: 3
Triglycerides: 87 mg/dL (ref 0.0–149.0)
VLDL: 17.4 mg/dL (ref 0.0–40.0)

## 2019-05-22 LAB — COMPREHENSIVE METABOLIC PANEL
ALT: 13 U/L (ref 0–35)
AST: 14 U/L (ref 0–37)
Albumin: 3.9 g/dL (ref 3.5–5.2)
Alkaline Phosphatase: 70 U/L (ref 39–117)
BUN: 11 mg/dL (ref 6–23)
CO2: 30 mEq/L (ref 19–32)
Calcium: 9.5 mg/dL (ref 8.4–10.5)
Chloride: 104 mEq/L (ref 96–112)
Creatinine, Ser: 0.75 mg/dL (ref 0.40–1.20)
GFR: 91.97 mL/min (ref >60.00)
Glucose, Bld: 91 mg/dL (ref 70–99)
Potassium: 4.4 mEq/L (ref 3.5–5.1)
Sodium: 142 mEq/L (ref 135–145)
Total Bilirubin: 0.6 mg/dL (ref 0.2–1.2)
Total Protein: 7.2 g/dL (ref 6.0–8.3)

## 2019-05-22 NOTE — Assessment & Plan Note (Addendum)
Encouraged heart healthy diet, increase exercise, avoid trans fats, consider a krill oil cap daily 

## 2019-05-22 NOTE — Patient Instructions (Signed)
See you next year!  Continue to eat heart healthy diet (full of fruits, vegetables, whole grains, lean protein, water--limit salt, fat, and sugar intake) and increase physical activity as tolerated.  Continue doing brain stimulating activities (puzzles, reading, adult coloring books, staying active) to keep memory sharp.    Candice Bennett , Thank you for taking time to come for your Medicare Wellness Visit. I appreciate your ongoing commitment to your health goals. Please review the following plan we discussed and let me know if I can assist you in the future.   These are the goals we discussed: Goals    . Decrease soda or juice intake    . Drink 4 glasses of water per day (pt-stated)    . Increase physical activity    . Limits sweets to 2 per day (pt-stated)    . Reduce fast food intake    . Weight (lb) < 170 lb (77.1 kg)       This is a list of the screening recommended for you and due dates:  Health Maintenance  Topic Date Due  . Mammogram  04/25/2020  . Tetanus Vaccine  04/06/2021  . Colon Cancer Screening  08/19/2021  . Flu Shot  Completed  . DEXA scan (bone density measurement)  Completed  .  Hepatitis C: One time screening is recommended by Center for Disease Control  (CDC) for  adults born from [birth year] through 1963.   Completed  . Pneumonia vaccines  Completed    Preventive Care 39 Years and Older, Female Preventive care refers to lifestyle choices and visits with your health care provider that can promote health and wellness. This includes:  A yearly physical exam. This is also called an annual well check.  Regular dental and eye exams.  Immunizations.  Screening for certain conditions.  Healthy lifestyle choices, such as diet and exercise. What can I expect for my preventive care visit? Physical exam Your health care provider will check:  Height and weight. These may be used to calculate body mass index (BMI), which is a measurement that tells if you are at a healthy  weight.  Heart rate and blood pressure.  Your skin for abnormal spots. Counseling Your health care provider may ask you questions about:  Alcohol, tobacco, and drug use.  Emotional well-being.  Home and relationship well-being.  Sexual activity.  Eating habits.  History of falls.  Memory and ability to understand (cognition).  Work and work Statistician.  Pregnancy and menstrual history. What immunizations do I need?  Influenza (flu) vaccine  This is recommended every year. Tetanus, diphtheria, and pertussis (Tdap) vaccine  You may need a Td booster every 10 years. Varicella (chickenpox) vaccine  You may need this vaccine if you have not already been vaccinated. Zoster (shingles) vaccine  You may need this after age 72. Pneumococcal conjugate (PCV13) vaccine  One dose is recommended after age 62. Pneumococcal polysaccharide (PPSV23) vaccine  One dose is recommended after age 51. Measles, mumps, and rubella (MMR) vaccine  You may need at least one dose of MMR if you were born in 1957 or later. You may also need a second dose. Meningococcal conjugate (MenACWY) vaccine  You may need this if you have certain conditions. Hepatitis A vaccine  You may need this if you have certain conditions or if you travel or work in places where you may be exposed to hepatitis A. Hepatitis B vaccine  You may need this if you have certain conditions or if you  travel or work in places where you may be exposed to hepatitis B. Haemophilus influenzae type b (Hib) vaccine  You may need this if you have certain conditions. You may receive vaccines as individual doses or as more than one vaccine together in one shot (combination vaccines). Talk with your health care provider about the risks and benefits of combination vaccines. What tests do I need? Blood tests  Lipid and cholesterol levels. These may be checked every 5 years, or more frequently depending on your overall health.   Hepatitis C test.  Hepatitis B test. Screening  Lung cancer screening. You may have this screening every year starting at age 56 if you have a 30-pack-year history of smoking and currently smoke or have quit within the past 15 years.  Colorectal cancer screening. All adults should have this screening starting at age 89 and continuing until age 42. Your health care provider may recommend screening at age 59 if you are at increased risk. You will have tests every 1-10 years, depending on your results and the type of screening test.  Diabetes screening. This is done by checking your blood sugar (glucose) after you have not eaten for a while (fasting). You may have this done every 1-3 years.  Mammogram. This may be done every 1-2 years. Talk with your health care provider about how often you should have regular mammograms.  BRCA-related cancer screening. This may be done if you have a family history of breast, ovarian, tubal, or peritoneal cancers. Other tests  Sexually transmitted disease (STD) testing.  Bone density scan. This is done to screen for osteoporosis. You may have this done starting at age 18. Follow these instructions at home: Eating and drinking  Eat a diet that includes fresh fruits and vegetables, whole grains, lean protein, and low-fat dairy products. Limit your intake of foods with high amounts of sugar, saturated fats, and salt.  Take vitamin and mineral supplements as recommended by your health care provider.  Do not drink alcohol if your health care provider tells you not to drink.  If you drink alcohol: ? Limit how much you have to 0-1 drink a day. ? Be aware of how much alcohol is in your drink. In the [place] one drink equals one 12 oz bottle of beer (355 mL), one 5 oz glass of wine (148 mL), or one 1 oz glass of hard liquor (44 mL). Lifestyle  Take daily care of your teeth and gums.  Stay active. Exercise for at least 30 minutes on 5 or more days each week.   Do not use any products that contain nicotine or tobacco, such as cigarettes, e-cigarettes, and chewing tobacco. If you need help quitting, ask your health care provider.  If you are sexually active, practice safe sex. Use a condom or other form of protection in order to prevent STIs (sexually transmitted infections).  Talk with your health care provider about taking a low-dose aspirin or statin. What's next?  Go to your health care provider once a year for a well check visit.  Ask your health care provider how often you should have your eyes and teeth checked.  Stay up to date on all vaccines. This information is not intended to replace advice given to you by your health care provider. Make sure you discuss any questions you have with your health care provider. Document Released: 07/05/2015 Document Revised: 06/02/2018 Document Reviewed: 06/02/2018 Elsevier Patient Education  2020 [employer].

## 2019-05-22 NOTE — Patient Instructions (Signed)

## 2019-05-22 NOTE — Assessment & Plan Note (Signed)
Well controlled, no changes to meds. Encouraged heart healthy diet such as the DASH diet and exercise as tolerated.  °

## 2019-05-22 NOTE — Progress Notes (Signed)
Patient ID: Candice Bennett, female    DOB: 07/29/1945  Age: 73 y.o. MRN: [id number]    Subjective:  Subjective  HPI Candice Bennett presents for f/u bp.  No complaints.    Review of Systems  Constitutional: Negative for appetite change, diaphoresis, fatigue and unexpected weight change.  Eyes: Negative for pain, redness and visual disturbance.  Respiratory: Negative for cough, chest tightness, shortness of breath and wheezing.   Cardiovascular: Negative for chest pain, palpitations and leg swelling.  Endocrine: Negative for cold intolerance, heat intolerance, polydipsia, polyphagia and polyuria.  Genitourinary: Negative for difficulty urinating, dysuria and frequency.  Neurological: Negative for dizziness, light-headedness, numbness and headaches.    History Past Medical History:  Diagnosis Date  . Follicular lymphoma ([place])   . OSA (obstructive sleep apnea)   . Sarcoidosis   . Unspecified essential hypertension     She has a past surgical history that includes Carpal tunnel release; Tubal ligation (1978); Lymph node biopsy; Abdominal hysterectomy (1983); port a cath insertion (2005); and Port-a-cath removal (2006).   Her family history includes Bell's palsy in her sister; Colon cancer in her brother; Dementia in her sister; Diabetes in her brother and sister; Heart attack in her father; Heart attack (age of onset: 78) in her sister; Heart disease in her mother; Heart disease (age of onset: 72) in her sister; Hypertension in her sister; Kidney disease in her sister; Lung cancer in her father; Multiple myeloma in her sister.She reports that she has never smoked. She has never used smokeless tobacco. She reports that she does not drink alcohol or use drugs.  Current Outpatient Medications on File Prior to Visit  Medication Sig Dispense Refill  . amLODipine (NORVASC) 5 MG tablet Take 1 tablet (5 mg total) by mouth daily. 90 tablet 1  . labetalol (NORMODYNE) 100 MG tablet  Take 1 tablet (100 mg total) by mouth 2 (two) times daily. 180 tablet 0  . mometasone (ELOCON) 0.1 % cream APPLY  CREAM TOPICALLY TO AFFECTED AREA(S) ONCE DAILY 45 g 1  . nystatin (NYSTATIN) powder Apply topically 4 (four) times daily. 15 g 0   No current facility-administered medications on file prior to visit.      Objective:  Objective  Physical Exam Vitals signs and nursing note reviewed.  Constitutional:      Appearance: She is well-developed.  HENT:     Head: Normocephalic and atraumatic.  Eyes:     Conjunctiva/sclera: Conjunctivae normal.  Neck:     Musculoskeletal: Normal range of motion and neck supple.     Thyroid: No thyromegaly.     Vascular: No carotid bruit or JVD.  Cardiovascular:     Rate and Rhythm: Normal rate and regular rhythm.     Heart sounds: Normal heart sounds. No murmur.  Pulmonary:     Effort: Pulmonary effort is normal. No respiratory distress.     Breath sounds: Normal breath sounds. No wheezing or rales.  Chest:     Chest wall: No tenderness.  Neurological:     Mental Status: She is alert and oriented to person, place, and time.    BP 120/68 (BP Location: Left Arm, Cuff Size: Large)   Pulse 76   Temp (!) 96.8 F (36 C) (Temporal)   Resp 12   Ht 5' 3"  (1.6 m)   Wt 198 lb (89.8 kg)   SpO2 98%   BMI 35.07 kg/m  Wt Readings from Last 3 Encounters:  05/22/19 198 lb (  89.8 kg)  05/22/19 198 lb 12.8 oz (90.2 kg)  04/20/19 197 lb 3.2 oz (89.4 kg)     Lab Results  Component Value Date   WBC 10.4 07/09/2017   HGB 12.3 07/09/2017   HCT 38.7 07/09/2017   PLT 278.0 07/09/2017   GLUCOSE 100 (H) 05/16/2018   CHOL 187 05/16/2018   TRIG 71.0 05/16/2018   HDL 67.70 05/16/2018   LDLCALC 105 (H) 05/16/2018   ALT 15 05/16/2018   AST 16 05/16/2018   NA 141 05/16/2018   K 4.5 05/16/2018   CL 104 05/16/2018   CREATININE 0.75 05/16/2018   BUN 11 05/16/2018   CO2 30 05/16/2018   TSH 0.86 04/07/2011   MICROALBUR 1.5 08/27/2017    Mm Diag  Breast Tomo Bilateral  Result Date: 04/26/2019 CLINICAL DATA:  73 year old patient with recent diffuse lateral left breast pain. EXAM: DIGITAL DIAGNOSTIC BILATERAL MAMMOGRAM WITH CAD AND TOMO COMPARISON:  Previous exam(s). ACR Breast Density Category b: There are scattered areas of fibroglandular density. FINDINGS: No mass, architectural distortion, or suspicious microcalcification is identified to suggest malignancy in either breast. Normal skin thickness bilaterally. Mammographic images were processed with CAD. IMPRESSION: No evidence of malignancy in either breast. RECOMMENDATION: Screening mammogram in one year.(Code:SM-B-01Y) I have discussed the findings and recommendations with the patient. If applicable, a reminder letter will be sent to the patient regarding the next appointment. BI-RADS CATEGORY  1: Negative. Electronically Signed   By: Curlene Dolphin M.D.   On: 04/26/2019 10:44     Assessment & Plan:  Plan  I am having Candice Bennett maintain her amLODipine, labetalol, mometasone, and nystatin.  No orders of the defined types were placed in this encounter.   Problem List Items Addressed This Visit      Unprioritized   Essential hypertension - Primary    Well controlled, no changes to meds. Encouraged heart healthy diet such as the DASH diet and exercise as tolerated.       Relevant Orders   Lipid panel   Comprehensive metabolic panel   Hyperlipidemia LDL goal <100    Encouraged heart healthy diet, increase exercise, avoid trans fats, consider a krill oil cap daily         Follow-up: Return in about 6 months (around 11/19/2019), or if symptoms worsen or fail to improve, for hypertension.  [name], DO

## 2019-05-22 NOTE — Progress Notes (Unsigned)
Patient ID: Candice Bennett, female    DOB: 29-Mar-1946  Age: 73 y.o. MRN: [id number]    Subjective:  Subjective  HPI Candice Bennett presents for ***  Review of Systems  History Past Medical History:  Diagnosis Date  . Follicular lymphoma ([place])   . OSA (obstructive sleep apnea)   . Sarcoidosis   . Unspecified essential hypertension     She has a past surgical history that includes Carpal tunnel release; Tubal ligation (1978); Lymph node biopsy; Abdominal hysterectomy (1983); port a cath insertion (2005); and Port-a-cath removal (2006).   Her family history includes Bell's palsy in her sister; Colon cancer in her brother; Dementia in her sister; Diabetes in her brother and sister; Heart attack in her father; Heart attack (age of onset: 81) in her sister; Heart disease in her mother; Heart disease (age of onset: 76) in her sister; Hypertension in her sister; Kidney disease in her sister; Lung cancer in her father; Multiple myeloma in her sister.She reports that she has never smoked. She has never used smokeless tobacco. She reports that she does not drink alcohol or use drugs.  Current Outpatient Medications on File Prior to Visit  Medication Sig Dispense Refill  . amLODipine (NORVASC) 5 MG tablet Take 1 tablet (5 mg total) by mouth daily. 90 tablet 1  . labetalol (NORMODYNE) 100 MG tablet Take 1 tablet (100 mg total) by mouth 2 (two) times daily. 180 tablet 0  . mometasone (ELOCON) 0.1 % cream APPLY  CREAM TOPICALLY TO AFFECTED AREA(S) ONCE DAILY 45 g 1  . nystatin (NYSTATIN) powder Apply topically 4 (four) times daily. 15 g 0   No current facility-administered medications on file prior to visit.      Objective:  Objective  Physical Exam There were no vitals taken for this visit. Wt Readings from Last 3 Encounters:  05/22/19 198 lb (89.8 kg)  05/22/19 198 lb 12.8 oz (90.2 kg)  04/20/19 197 lb 3.2 oz (89.4 kg)     Lab Results  Component Value Date   WBC 10.4 07/09/2017    HGB 12.3 07/09/2017   HCT 38.7 07/09/2017   PLT 278.0 07/09/2017   GLUCOSE 100 (H) 05/16/2018   CHOL 187 05/16/2018   TRIG 71.0 05/16/2018   HDL 67.70 05/16/2018   LDLCALC 105 (H) 05/16/2018   ALT 15 05/16/2018   AST 16 05/16/2018   NA 141 05/16/2018   K 4.5 05/16/2018   CL 104 05/16/2018   CREATININE 0.75 05/16/2018   BUN 11 05/16/2018   CO2 30 05/16/2018   TSH 0.86 04/07/2011   MICROALBUR 1.5 08/27/2017    Mm Diag Breast Tomo Bilateral  Result Date: 04/26/2019 CLINICAL DATA:  73 year old patient with recent diffuse lateral left breast pain. EXAM: DIGITAL DIAGNOSTIC BILATERAL MAMMOGRAM WITH CAD AND TOMO COMPARISON:  Previous exam(s). ACR Breast Density Category b: There are scattered areas of fibroglandular density. FINDINGS: No mass, architectural distortion, or suspicious microcalcification is identified to suggest malignancy in either breast. Normal skin thickness bilaterally. Mammographic images were processed with CAD. IMPRESSION: No evidence of malignancy in either breast. RECOMMENDATION: Screening mammogram in one year.(Code:SM-B-01Y) I have discussed the findings and recommendations with the patient. If applicable, a reminder letter will be sent to the patient regarding the next appointment. BI-RADS CATEGORY  1: Negative. Electronically Signed   By: Curlene Dolphin M.D.   On: 04/26/2019 10:44     Assessment & Plan:  Plan  I am having Candice Bennett maintain her amLODipine,  labetalol, mometasone, and nystatin.  No orders of the defined types were placed in this encounter.   Problem List Items Addressed This Visit      Unprioritized   Breast pain, left - Primary   Relevant Orders   MR BREAST LEFT W WO CONTRAST INC CAD      Follow-up: No follow-ups on file.  [name], DO

## 2019-05-29 ENCOUNTER — Other Ambulatory Visit: Payer: Self-pay

## 2019-05-29 ENCOUNTER — Ambulatory Visit (HOSPITAL_COMMUNITY)
Admission: RE | Admit: 2019-05-29 | Discharge: 2019-05-29 | Disposition: A | Discharge status: Home / Self Care | Payer: Medicare Other | Source: Ambulatory Visit | Attending: Family Medicine | Admitting: Family Medicine

## 2019-05-29 DIAGNOSIS — N644 Mastodynia: Secondary | ICD-10-CM | POA: Diagnosis not present

## 2019-05-29 MED ORDER — GADOBUTROL 1 MMOL/ML IV SOLN
9.0000 mL | Freq: Once | INTRAVENOUS | Status: AC | PRN
  Administered 2019-05-29: 11:00:00 9 mL via INTRAVENOUS

## 2019-06-07 ENCOUNTER — Other Ambulatory Visit: Payer: Self-pay | Admitting: Family Medicine

## 2019-06-07 DIAGNOSIS — I10 Essential (primary) hypertension: Secondary | ICD-10-CM

## 2019-06-09 ENCOUNTER — Other Ambulatory Visit: Payer: Self-pay

## 2019-06-09 ENCOUNTER — Ambulatory Visit (HOSPITAL_BASED_OUTPATIENT_CLINIC_OR_DEPARTMENT_OTHER)
Admission: RE | Admit: 2019-06-09 | Discharge: 2019-06-09 | Disposition: A | Discharge status: Home / Self Care | Payer: Medicare Other | Source: Ambulatory Visit | Attending: Family Medicine | Admitting: Family Medicine

## 2019-06-09 DIAGNOSIS — Z780 Asymptomatic menopausal state: Secondary | ICD-10-CM | POA: Insufficient documentation

## 2019-09-11 ENCOUNTER — Other Ambulatory Visit: Payer: Self-pay | Admitting: Family Medicine

## 2019-09-11 DIAGNOSIS — I10 Essential (primary) hypertension: Secondary | ICD-10-CM

## 2019-11-14 ENCOUNTER — Other Ambulatory Visit: Payer: Self-pay

## 2019-11-14 ENCOUNTER — Encounter: Payer: Self-pay | Admitting: Family Medicine

## 2019-11-14 ENCOUNTER — Ambulatory Visit (INDEPENDENT_AMBULATORY_CARE_PROVIDER_SITE_OTHER): Payer: Medicare Other | Admitting: Family Medicine

## 2019-11-14 VITALS — BP 118/72 | HR 72 | Temp 97.7°F | Resp 18 | Ht 63.0 in | Wt 188.4 lb

## 2019-11-14 DIAGNOSIS — E785 Hyperlipidemia, unspecified: Secondary | ICD-10-CM

## 2019-11-14 DIAGNOSIS — I10 Essential (primary) hypertension: Secondary | ICD-10-CM | POA: Diagnosis not present

## 2019-11-14 LAB — LIPID PANEL
Cholesterol: 168 mg/dL (ref 0–200)
HDL: 51.3 mg/dL (ref >39.00)
LDL Cholesterol: 100 mg/dL — ABNORMAL HIGH (ref 0–99)
NonHDL: 116.61
Total CHOL/HDL Ratio: 3
Triglycerides: 83 mg/dL (ref 0.0–149.0)
VLDL: 16.6 mg/dL (ref 0.0–40.0)

## 2019-11-14 LAB — COMPREHENSIVE METABOLIC PANEL
ALT: 11 U/L (ref 0–35)
AST: 12 U/L (ref 0–37)
Albumin: 4.1 g/dL (ref 3.5–5.2)
Alkaline Phosphatase: 75 U/L (ref 39–117)
BUN: 11 mg/dL (ref 6–23)
CO2: 28 mEq/L (ref 19–32)
Calcium: 9.1 mg/dL (ref 8.4–10.5)
Chloride: 106 mEq/L (ref 96–112)
Creatinine, Ser: 0.75 mg/dL (ref 0.40–1.20)
GFR: 91.85 mL/min (ref >60.00)
Glucose, Bld: 106 mg/dL — ABNORMAL HIGH (ref 70–99)
Potassium: 3.8 mEq/L (ref 3.5–5.1)
Sodium: 139 mEq/L (ref 135–145)
Total Bilirubin: 0.4 mg/dL (ref 0.2–1.2)
Total Protein: 6.8 g/dL (ref 6.0–8.3)

## 2019-11-14 NOTE — Assessment & Plan Note (Signed)
Encouraged heart healthy diet, increase exercise, avoid trans fats, consider a krill oil cap daily 

## 2019-11-14 NOTE — Assessment & Plan Note (Signed)
Well controlled, no changes to meds. Encouraged heart healthy diet such as the DASH diet and exercise as tolerated.  °

## 2019-11-14 NOTE — Patient Instructions (Signed)
DASH Eating Plan DASH stands for "Dietary Approaches to Stop Hypertension." The DASH eating plan is a healthy eating plan that has been shown to reduce high blood pressure (hypertension). It may also reduce your risk for type 2 diabetes, heart disease, and stroke. The DASH eating plan may also help with weight loss. What are tips for following this plan?  General guidelines  Avoid eating more than 2,300 mg (milligrams) of salt (sodium) a day. If you have hypertension, you may need to reduce your sodium intake to 1,500 mg a day.  Limit alcohol intake to no more than 1 drink a day for nonpregnant women and 2 drinks a day for men. One drink equals 12 oz of beer, 5 oz of wine, or 1 oz of hard liquor.  Work with your health care provider to maintain a healthy body weight or to lose weight. Ask what an ideal weight is for you.  Get at least 30 minutes of exercise that causes your heart to beat faster (aerobic exercise) most days of the week. Activities may include walking, swimming, or biking.  Work with your health care provider or diet and nutrition specialist (dietitian) to adjust your eating plan to your individual calorie needs. Reading food labels   Check food labels for the amount of sodium per serving. Choose foods with less than 5 percent of the Daily Value of sodium. Generally, foods with less than 300 mg of sodium per serving fit into this eating plan.  To find whole grains, look for the word "whole" as the first word in the ingredient list. Shopping  Buy products labeled as "low-sodium" or "no salt added."  Buy fresh foods. Avoid canned foods and premade or frozen meals. Cooking  Avoid adding salt when cooking. Use salt-free seasonings or herbs instead of table salt or sea salt. Check with your health care provider or pharmacist before using salt substitutes.  Do not fry foods. Cook foods using healthy methods such as baking, boiling, grilling, and broiling instead.  Cook with  heart-healthy oils, such as olive, canola, soybean, or sunflower oil. Meal planning  Eat a balanced diet that includes: ? 5 or more servings of fruits and vegetables each day. At each meal, try to fill half of your plate with fruits and vegetables. ? Up to 6-8 servings of whole grains each day. ? Less than 6 oz of lean meat, poultry, or fish each day. A 3-oz serving of meat is about the same size as a deck of cards. One egg equals 1 oz. ? 2 servings of low-fat dairy each day. ? A serving of nuts, seeds, or beans 5 times each week. ? Heart-healthy fats. Healthy fats called Omega-3 fatty acids are found in foods such as flaxseeds and coldwater fish, like sardines, salmon, and mackerel.  Limit how much you eat of the following: ? Canned or prepackaged foods. ? Food that is high in trans fat, such as fried foods. ? Food that is high in saturated fat, such as fatty meat. ? Sweets, desserts, sugary drinks, and other foods with added sugar. ? Full-fat dairy products.  Do not salt foods before eating.  Try to eat at least 2 vegetarian meals each week.  Eat more home-cooked food and less restaurant, buffet, and fast food.  When eating at a restaurant, ask that your food be prepared with less salt or no salt, if possible. What foods are recommended? The items listed may not be a complete list. Talk with your dietitian about   what dietary choices are best for you. Grains Whole-grain or whole-wheat bread. Whole-grain or whole-wheat pasta. Brown rice. Oatmeal. Quinoa. Bulgur. Whole-grain and low-sodium cereals. Pita bread. Low-fat, low-sodium crackers. Whole-wheat flour tortillas. Vegetables Fresh or frozen vegetables (raw, steamed, roasted, or grilled). Low-sodium or reduced-sodium tomato and vegetable juice. Low-sodium or reduced-sodium tomato sauce and tomato paste. Low-sodium or reduced-sodium canned vegetables. Fruits All fresh, dried, or frozen fruit. Canned fruit in natural juice (without  added sugar). Meat and other protein foods Skinless chicken or turkey. Ground chicken or turkey. Pork with fat trimmed off. Fish and seafood. Egg whites. Dried beans, peas, or lentils. Unsalted nuts, nut butters, and seeds. Unsalted canned beans. Lean cuts of beef with fat trimmed off. Low-sodium, lean deli meat. Dairy Low-fat (1%) or fat-free (skim) milk. Fat-free, low-fat, or reduced-fat cheeses. Nonfat, low-sodium ricotta or cottage cheese. Low-fat or nonfat yogurt. Low-fat, low-sodium cheese. Fats and oils Soft margarine without trans fats. Vegetable oil. Low-fat, reduced-fat, or light mayonnaise and salad dressings (reduced-sodium). Canola, safflower, olive, soybean, and sunflower oils. Avocado. Seasoning and other foods Herbs. Spices. Seasoning mixes without salt. Unsalted popcorn and pretzels. Fat-free sweets. What foods are not recommended? The items listed may not be a complete list. Talk with your dietitian about what dietary choices are best for you. Grains Baked goods made with fat, such as croissants, muffins, or some breads. Dry pasta or rice meal packs. Vegetables Creamed or fried vegetables. Vegetables in a cheese sauce. Regular canned vegetables (not low-sodium or reduced-sodium). Regular canned tomato sauce and paste (not low-sodium or reduced-sodium). Regular tomato and vegetable juice (not low-sodium or reduced-sodium). Pickles. Olives. Fruits Canned fruit in a light or heavy syrup. Fried fruit. Fruit in cream or butter sauce. Meat and other protein foods Fatty cuts of meat. Ribs. Fried meat. Bacon. Sausage. Bologna and other processed lunch meats. Salami. Fatback. Hotdogs. Bratwurst. Salted nuts and seeds. Canned beans with added salt. Canned or smoked fish. Whole eggs or egg yolks. Chicken or turkey with skin. Dairy Whole or 2% milk, cream, and half-and-half. Whole or full-fat cream cheese. Whole-fat or sweetened yogurt. Full-fat cheese. Nondairy creamers. Whipped toppings.  Processed cheese and cheese spreads. Fats and oils Butter. Stick margarine. Lard. Shortening. Ghee. Bacon fat. Tropical oils, such as coconut, palm kernel, or palm oil. Seasoning and other foods Salted popcorn and pretzels. Onion salt, garlic salt, seasoned salt, table salt, and sea salt. Worcestershire sauce. Tartar sauce. Barbecue sauce. Teriyaki sauce. Soy sauce, including reduced-sodium. Steak sauce. Canned and packaged gravies. Fish sauce. Oyster sauce. Cocktail sauce. Horseradish that you find on the shelf. Ketchup. Mustard. Meat flavorings and tenderizers. Bouillon cubes. Hot sauce and Tabasco sauce. Premade or packaged marinades. Premade or packaged taco seasonings. Relishes. Regular salad dressings. Where to find more information:  National Heart, Lung, and Blood Institute: www.nhlbi.nih.gov  American Heart Association: www.heart.org Summary  The DASH eating plan is a healthy eating plan that has been shown to reduce high blood pressure (hypertension). It may also reduce your risk for type 2 diabetes, heart disease, and stroke.  With the DASH eating plan, you should limit salt (sodium) intake to 2,300 mg a day. If you have hypertension, you may need to reduce your sodium intake to 1,500 mg a day.  When on the DASH eating plan, aim to eat more fresh fruits and vegetables, whole grains, lean proteins, low-fat dairy, and heart-healthy fats.  Work with your health care provider or diet and nutrition specialist (dietitian) to adjust your eating plan to your   individual calorie needs. This information is not intended to replace advice given to you by your health care provider. Make sure you discuss any questions you have with your health care provider. Document Revised: 05/21/2017 Document Reviewed: 06/01/2016 Elsevier Patient Education  2020 [employer]  

## 2019-11-14 NOTE — Progress Notes (Signed)
Patient ID: [name], female    DOB: 1945/12/03  Age: 74 y.o. MRN: [id number]    Subjective:  Subjective  HPI ESTEPHANI POPPER presents for f/u bp and cholesterol    No complaints   Review of Systems  Constitutional: Negative for appetite change, diaphoresis, fatigue and unexpected weight change.  Eyes: Negative for pain, redness and visual disturbance.  Respiratory: Negative for cough, chest tightness, shortness of breath and wheezing.   Cardiovascular: Negative for chest pain, palpitations and leg swelling.  Endocrine: Negative for cold intolerance, heat intolerance, polydipsia, polyphagia and polyuria.  Genitourinary: Negative for difficulty urinating, dysuria and frequency.  Neurological: Negative for dizziness, light-headedness, numbness and headaches.    History Past Medical History:  Diagnosis Date  . Follicular lymphoma ([place])   . OSA (obstructive sleep apnea)   . Sarcoidosis   . Unspecified essential hypertension     She has a past surgical history that includes Carpal tunnel release; Tubal ligation (1978); Lymph node biopsy; Abdominal hysterectomy (1983); port a cath insertion (2005); and Port-a-cath removal (2006).   Her family history includes Bell's palsy in her sister; Colon cancer in her brother; Dementia in her sister; Diabetes in her brother and sister; Heart attack in her father; Heart attack (age of onset: 47) in her sister; Heart disease in her mother; Heart disease (age of onset: 2) in her sister; Hypertension in her sister; Kidney disease in her sister; Lung cancer in her father; Multiple myeloma in her sister.She reports that she has never smoked. She has never used smokeless tobacco. She reports that she does not drink alcohol or use drugs.  Current Outpatient Medications on File Prior to Visit  Medication Sig Dispense Refill  . amLODipine (NORVASC) 5 MG tablet Take 1 tablet by mouth once daily 90 tablet 1  . labetalol (NORMODYNE) 100 MG tablet Take  1 tablet by mouth twice daily 180 tablet 1  . mometasone (ELOCON) 0.1 % cream APPLY  CREAM TOPICALLY TO AFFECTED AREA(S) ONCE DAILY 45 g 1  . nystatin (NYSTATIN) powder Apply topically 4 (four) times daily. 15 g 0   No current facility-administered medications on file prior to visit.     Objective:  Objective  Physical Exam Vitals and nursing note reviewed.  Constitutional:      Appearance: She is well-developed.  HENT:     Head: Normocephalic and atraumatic.  Eyes:     Conjunctiva/sclera: Conjunctivae normal.  Neck:     Thyroid: No thyromegaly.     Vascular: No carotid bruit or JVD.  Cardiovascular:     Rate and Rhythm: Normal rate and regular rhythm.     Heart sounds: Normal heart sounds. No murmur.  Pulmonary:     Effort: Pulmonary effort is normal. No respiratory distress.     Breath sounds: Normal breath sounds. No wheezing or rales.  Chest:     Chest wall: No tenderness.  Musculoskeletal:     Cervical back: Normal range of motion and neck supple.  Neurological:     Mental Status: She is alert and oriented to person, place, and time.    BP 118/72 (BP Location: Left Arm, Patient Position: Sitting, Cuff Size: Normal)   Pulse 72   Temp 97.7 F (36.5 C) (Temporal)   Resp 18   Ht _0  (1.6 m)   Wt 188 lb 6.4 oz (85.5 kg)   SpO2 98%   BMI 33.37 kg/m  Wt Readings from Last 3 Encounters:  11/14/19 188 lb 6.4 oz (  85.5 kg)  05/22/19 198 lb (89.8 kg)  05/22/19 198 lb 12.8 oz (90.2 kg)     Lab Results  Component Value Date   WBC 10.4 07/09/2017   HGB 12.3 07/09/2017   HCT 38.7 07/09/2017   PLT 278.0 07/09/2017   GLUCOSE 91 05/22/2019   CHOL 178 05/22/2019   TRIG 87.0 05/22/2019   HDL 59.80 05/22/2019   LDLCALC 101 (H) 05/22/2019   ALT 13 05/22/2019   AST 14 05/22/2019   NA 142 05/22/2019   K 4.4 05/22/2019   CL 104 05/22/2019   CREATININE 0.75 05/22/2019   BUN 11 05/22/2019   CO2 30 05/22/2019   TSH 0.86 04/07/2011   MICROALBUR 1.5 08/27/2017    DG  Bone Density  Result Date: 06/09/2019 EXAM: DUAL X-RAY ABSORPTIOMETRY (DXA) FOR BONE MINERAL DENSITY IMPRESSION: Alferd Apa [place] CHASE Your patient Natashia Roseman completed a BMD test on 06/09/2019 using the [place] (analysis version: 16.SP2) manufactured by [employer]. The following summarizes the results of our evaluation. [facility] PATIENT: Name: Lizbeth, Feijoo Patient ID: [id number] Birth Date: 27-Nov-1945 Height: 62.5 in. Gender: Female Measured: 06/09/2019 Weight: 199.8 lbs. Indications: Advanced Age, African-American, Chemotherapy for Cancer, Early Menopause, Estrogen Deficiency, Hysterectomy, Low Calcium Intake, Post Menopausal Fractures: Treatments: ASSESSMENT: The BMD measured at Femur Neck Right is 0.791 g/cm2 with a T-score of -1.8. This patient is considered osteopenic according to [facility]) criteria. L- 4 was excluded due to degenerative changes. The scan quality is good. Site Region Measured Date Measured Age WHO YA BMD Classification T-score AP Spine L1-L4 (L3) 06/09/2019 [age] Osteopenia -1.4 1.003 g/cm2 DualFemur Neck Right 06/09/2019 [age] Osteopenia -1.8 0.791 g/cm2 World Health Organization [facility]) criteria for post-menopausal, Caucasian Women: Normal       T-score at or above -1 SD Osteopenia   T-score between -1 and -2.5 SD Osteoporosis T-score at or below -2.5 SD RECOMMENDATION:1. All patients should optimize calcium and vitamin D intake. 2. Consider FDA-approved medical therapies in postmenopausal women and men aged 31 years and older, based on the following: a. A hip or vertebral(clinical or morphometric) fracture. b. T-Score < -2.5 at the femoral neck or spine after appropriate evaluation to exclude secondary causes c. Low bone mass (T-score between -1.0 and -2.5 at the femoral neck or spine) and a 10 year probability of a hip fracture >3% or a 10 year probability of major osteoporosis-related fracture > 20% based on the [place]-adapted WHO algorithm d.  Clinical judgement and/or patient preferences may indicate treatment for people with 10-year fracture probabilities above or below these levels FOLLOW-UP: Patients with diagnosis of osteoporosis or at high risk for fracture should have regular bone mineral density tests. For patients eligible for Medicare, routine testing is allowed once every 2 years. The testing frequency can be increased to one year for patients who have rapidly progressing disease, those who are receiving or discontinuing medical therapy to restore bone mass, or have additional risk factors. I have reviewed this report and agree with the above findings. [facility] Patient: Vic Blackbird T Referring Physician: Rosalita Chessman CHASE Birth Date: 1946/05/27 [age] Patient ID: [id number] Height: 62.5 in. Weight: 199.8 lbs. Measured: 06/09/2019 10:27:15 AM (16 SP 4) Gender: Female Ethnicity: Black Analyzed: 06/09/2019 10:27:43 AM (16 SP 4) FRAX* 10-year Probability of Fracture Based on femoral neck BMD: DualFemur (Right) Major Osteoporotic Fracture: 4.6% Hip Fracture:  0.8% Population:                  [place] Child psychotherapist) Risk Factors:                None *FRAX is a Materials engineer of the [employer] for Metabolic Bone Disease, a [place] (WHO) [facility]. ASSESSMENT: The probability of a major osteoporotic fracture is 4.6% within the next ten years. The probability of a hip fracture is 0.8% within the next ten years. Electronically Signed   By: Lowella Grip III M.D.   On: 06/09/2019 11:03     Assessment & Plan:  Plan  I am having Chairty T. Suppa maintain her mometasone, nystatin, amLODipine, and labetalol.  No orders of the defined types were placed in this encounter.   Problem List Items Addressed This Visit      Unprioritized   Essential hypertension - Primary    Well controlled, no changes to meds. Encouraged heart healthy diet such as  the DASH diet and exercise as tolerated.       Relevant Orders   Lipid panel   Comprehensive metabolic panel   Hyperlipidemia LDL goal <100    Encouraged heart healthy diet, increase exercise, avoid trans fats, consider a krill oil cap daily       Other Visit Diagnoses    Hyperlipidemia, unspecified hyperlipidemia type       Relevant Orders   Lipid panel   Comprehensive metabolic panel      Follow-up: Return in about 6 months (around 05/16/2020), or if symptoms worsen or fail to improve, for hypertension, hyperlipidemia.  [name], DO

## 2020-03-11 ENCOUNTER — Other Ambulatory Visit: Payer: Self-pay | Admitting: Family Medicine

## 2020-03-11 DIAGNOSIS — I10 Essential (primary) hypertension: Secondary | ICD-10-CM

## 2020-05-08 ENCOUNTER — Other Ambulatory Visit: Payer: Self-pay

## 2020-05-08 ENCOUNTER — Ambulatory Visit (INDEPENDENT_AMBULATORY_CARE_PROVIDER_SITE_OTHER): Payer: Medicare Other

## 2020-05-08 DIAGNOSIS — Z111 Encounter for screening for respiratory tuberculosis: Secondary | ICD-10-CM

## 2020-05-08 NOTE — Progress Notes (Signed)
Pt is here today for ppd placement. Pt's PPD was placed in left arm. Pt tolerated well

## 2020-05-10 ENCOUNTER — Telehealth: Payer: Self-pay

## 2020-05-10 NOTE — Telephone Encounter (Signed)
Patient came in today for TB skin test reading. She has upcoming appointment with PCP on Tuesday and is needing her health screening form filled out. I have placed on Heather-Dr. Lowne's CMA's desk until appointment.

## 2020-05-14 ENCOUNTER — Other Ambulatory Visit: Payer: Self-pay

## 2020-05-14 ENCOUNTER — Ambulatory Visit (INDEPENDENT_AMBULATORY_CARE_PROVIDER_SITE_OTHER): Payer: Medicare Other | Admitting: Family Medicine

## 2020-05-14 ENCOUNTER — Encounter: Payer: Self-pay | Admitting: Family Medicine

## 2020-05-14 ENCOUNTER — Other Ambulatory Visit: Payer: Self-pay | Admitting: Family Medicine

## 2020-05-14 VITALS — BP 110/70 | HR 74 | Temp 97.8°F | Resp 18 | Ht 63.0 in | Wt 199.4 lb

## 2020-05-14 DIAGNOSIS — N898 Other specified noninflammatory disorders of vagina: Secondary | ICD-10-CM | POA: Diagnosis not present

## 2020-05-14 DIAGNOSIS — Z0184 Encounter for antibody response examination: Secondary | ICD-10-CM

## 2020-05-14 DIAGNOSIS — I10 Essential (primary) hypertension: Secondary | ICD-10-CM

## 2020-05-14 DIAGNOSIS — Z1231 Encounter for screening mammogram for malignant neoplasm of breast: Secondary | ICD-10-CM

## 2020-05-14 DIAGNOSIS — Z1239 Encounter for other screening for malignant neoplasm of breast: Secondary | ICD-10-CM

## 2020-05-14 DIAGNOSIS — Z23 Encounter for immunization: Secondary | ICD-10-CM

## 2020-05-14 DIAGNOSIS — E785 Hyperlipidemia, unspecified: Secondary | ICD-10-CM

## 2020-05-14 LAB — POC URINALSYSI DIPSTICK (AUTOMATED)
Bilirubin, UA: NEGATIVE
Blood, UA: NEGATIVE
Glucose, UA: NEGATIVE
Ketones, UA: NEGATIVE
Leukocytes, UA: NEGATIVE
Nitrite, UA: NEGATIVE
Protein, UA: NEGATIVE
Spec Grav, UA: 1.02 (ref 1.010–1.025)
Urobilinogen, UA: 0.2 E.U./dL
pH, UA: 6.5 (ref 5.0–8.0)

## 2020-05-14 LAB — COMPREHENSIVE METABOLIC PANEL
ALT: 11 U/L (ref 0–35)
AST: 15 U/L (ref 0–37)
Albumin: 4.1 g/dL (ref 3.5–5.2)
Alkaline Phosphatase: 73 U/L (ref 39–117)
BUN: 10 mg/dL (ref 6–23)
CO2: 31 mEq/L (ref 19–32)
Calcium: 9.1 mg/dL (ref 8.4–10.5)
Chloride: 102 mEq/L (ref 96–112)
Creatinine, Ser: 0.78 mg/dL (ref 0.40–1.20)
GFR: 75.71 mL/min (ref >60.00)
Glucose, Bld: 94 mg/dL (ref 70–99)
Potassium: 4.3 mEq/L (ref 3.5–5.1)
Sodium: 137 mEq/L (ref 135–145)
Total Bilirubin: 0.5 mg/dL (ref 0.2–1.2)
Total Protein: 7.2 g/dL (ref 6.0–8.3)

## 2020-05-14 LAB — LIPID PANEL
Cholesterol: 163 mg/dL (ref 0–200)
HDL: 63 mg/dL (ref >39.00)
LDL Cholesterol: 84 mg/dL (ref 0–99)
NonHDL: 99.95
Total CHOL/HDL Ratio: 3
Triglycerides: 79 mg/dL (ref 0.0–149.0)
VLDL: 15.8 mg/dL (ref 0.0–40.0)

## 2020-05-14 MED ORDER — CLOTRIMAZOLE-BETAMETHASONE 1-0.05 % EX CREA: 1.0000 "application " | TOPICAL_CREAM | Freq: Two times a day (BID) | CUTANEOUS | 0 refills | Status: DC

## 2020-05-14 NOTE — Progress Notes (Signed)
Patient ID: [name], female    DOB: [birth year]  Age: 74 y.o. MRN: [id number]    Subjective:  Subjective  HPI ALEISHA PAONE presents for f/u bp and cholesterol   Pt c/o ext vaginal itching-- no d/c , no odor ,  otc cortisone has helped some.     Review of Systems  Constitutional: Negative for appetite change, diaphoresis, fatigue and unexpected weight change.  Eyes: Negative for pain, redness and visual disturbance.  Respiratory: Negative for cough, chest tightness, shortness of breath and wheezing.   Cardiovascular: Negative for chest pain, palpitations and leg swelling.  Endocrine: Negative for cold intolerance, heat intolerance, polydipsia, polyphagia and polyuria.  Genitourinary: Negative for difficulty urinating, dysuria and frequency.  Neurological: Negative for dizziness, light-headedness, numbness and headaches.    History Past Medical History:  Diagnosis Date  . Follicular lymphoma ([place])   . OSA (obstructive sleep apnea)   . Sarcoidosis   . Unspecified essential hypertension     She has a past surgical history that includes Carpal tunnel release; Tubal ligation (1978); Lymph node biopsy; Abdominal hysterectomy (1983); port a cath insertion (2005); and Port-a-cath removal (2006).   Her family history includes Bell's palsy in her sister; Colon cancer in her brother; Dementia in her sister; Diabetes in her brother and sister; Heart attack in her father; Heart attack (age of onset: 43) in her sister; Heart disease in her mother; Heart disease (age of onset: 74) in her sister; Hypertension in her sister; Kidney disease in her sister; Lung cancer in her father; Multiple myeloma in her sister.She reports that she has never smoked. She has never used smokeless tobacco. She reports that she does not drink alcohol and does not use drugs.  Current Outpatient Medications on File Prior to Visit  Medication Sig Dispense Refill  . amLODipine (NORVASC) 5 MG tablet Take 1 tablet  by mouth once daily 90 tablet 0  . labetalol (NORMODYNE) 100 MG tablet Take 1 tablet by mouth twice daily 180 tablet 0  . mometasone (ELOCON) 0.1 % cream APPLY  CREAM TOPICALLY TO AFFECTED AREA(S) ONCE DAILY 45 g 1  . nystatin (NYSTATIN) powder Apply topically 4 (four) times daily. 15 g 0   No current facility-administered medications on file prior to visit.     Objective:  Objective  Physical Exam Vitals and nursing note reviewed.  Constitutional:      Appearance: She is well-developed.  HENT:     Head: Normocephalic and atraumatic.  Eyes:     Conjunctiva/sclera: Conjunctivae normal.  Neck:     Thyroid: No thyromegaly.     Vascular: No carotid bruit or JVD.  Cardiovascular:     Rate and Rhythm: Normal rate and regular rhythm.     Heart sounds: Normal heart sounds. No murmur heard.   Pulmonary:     Effort: Pulmonary effort is normal. No respiratory distress.     Breath sounds: Normal breath sounds. No wheezing or rales.  Chest:     Chest wall: No tenderness.  Musculoskeletal:     Cervical back: Normal range of motion and neck supple.  Neurological:     Mental Status: She is alert and oriented to person, place, and time.    BP 110/70 (BP Location: Right Arm, Patient Position: Sitting, Cuff Size: Large)   Pulse 74   Temp 97.8 F (36.6 C) (Oral)   Resp 18   Ht _0  (1.6 m)   Wt 199 lb 6.4 oz (90.4 kg)  SpO2 99%   BMI 35.32 kg/m  Wt Readings from Last 3 Encounters:  05/14/20 199 lb 6.4 oz (90.4 kg)  11/14/19 188 lb 6.4 oz (85.5 kg)  05/22/19 198 lb (89.8 kg)     Lab Results  Component Value Date   WBC 10.4 07/09/2017   HGB 12.3 07/09/2017   HCT 38.7 07/09/2017   PLT 278.0 07/09/2017   GLUCOSE 106 (H) 11/14/2019   CHOL 168 11/14/2019   TRIG 83.0 11/14/2019   HDL 51.30 11/14/2019   LDLCALC 100 (H) 11/14/2019   ALT 11 11/14/2019   AST 12 11/14/2019   NA 139 11/14/2019   K 3.8 11/14/2019   CL 106 11/14/2019   CREATININE 0.75 11/14/2019   BUN 11  11/14/2019   CO2 28 11/14/2019   TSH 0.86 04/07/2011   MICROALBUR 1.5 08/27/2017    DG Bone Density  Result Date: 06/09/2019 EXAM: DUAL X-RAY ABSORPTIOMETRY (DXA) FOR BONE MINERAL DENSITY IMPRESSION: Alferd Apa [place] CHASE Your patient Chantal Worthey completed a BMD test on 06/09/2019 using the [place] (analysis version: 16.SP2) manufactured by [employer]. The following summarizes the results of our evaluation. [facility] PATIENT: Name: Bertrice, Leder Patient ID: [id number] Birth Date: 01-29-1946 Height: 62.5 in. Gender: Female Measured: 06/09/2019 Weight: 199.8 lbs. Indications: Advanced Age, African-American, Chemotherapy for Cancer, Early Menopause, Estrogen Deficiency, Hysterectomy, Low Calcium Intake, Post Menopausal Fractures: Treatments: ASSESSMENT: The BMD measured at Femur Neck Right is 0.791 g/cm2 with a T-score of -1.8. This patient is considered osteopenic according to [facility]) criteria. L- 4 was excluded due to degenerative changes. The scan quality is good. Site Region Measured Date Measured Age WHO YA BMD Classification T-score AP Spine L1-L4 (L3) 06[age]09/2019 73.8 Osteopenia -1.4 1.003 g/cm2 DualFemur Neck Right 06/09/2019 [age] years Osteopenia -1.8 0.791 g/cm2 World Health Organization [facility]) criteria for post-menopausal, Caucasian Women: Normal       T-score at or above -1 SD Osteopenia   T-score between -1 and -2.5 SD Osteoporosis T-score at or below -2.5 SD RECOMMENDATION:1. All patients should optimize calcium and vitamin D intake. 2. Consider FDA-approved medical therapies in postmenopausal women and men aged 57 years and older, based on the following: a. A hip or vertebral(clinical or morphometric) fracture. b. T-Score < -2.5 at the femoral neck or spine after appropriate evaluation to exclude secondary causes c. Low bone mass (T-score between -1.0 and -2.5 at the femoral neck or spine) and a 10 year probability of a hip fracture >3% or a 10 year  probability of major osteoporosis-related fracture > 20% based on the [place]-adapted WHO algorithm d. Clinical judgement and/or patient preferences may indicate treatment for people with 10-year fracture probabilities above or below these levels FOLLOW-UP: Patients with diagnosis of osteoporosis or at high risk for fracture should have regular bone mineral density tests. For patients eligible for Medicare, routine testing is allowed once every 2 years. The testing frequency can be increased to one year for patients who have rapidly progressing disease, those who are receiving or discontinuing medical therapy to restore bone mass, or have additional risk factors. I have reviewed this report and agree with the above findings. [facility] Patient: Vic Blackbird T Referring Physician: Rosalita Chessman CHASE Birth Date: Jan 06, 1946 [age] Patient ID: [id number] Height: 62.5 in. Weight: 199.8 lbs. Measured: 06/09/2019 10:27:15 AM (16 SP 4) Gender: Female Ethnicity: Black Analyzed: 06/09/2019 10:27:43 AM (16 SP 4) FRAX* 10-year Probability of Fracture Based  on femoral neck BMD: DualFemur (Right) Major Osteoporotic Fracture: 4.6% Hip Fracture:                0.8% Population:                  [place] (Black) Risk Factors:                None *FRAX is a Materials engineer of the [employer] for Metabolic Bone Disease, a [place] Pharmacologist (WHO) [facility]. ASSESSMENT: The probability of a major osteoporotic fracture is 4.6% within the next ten years. The probability of a hip fracture is 0.8% within the next ten years. Electronically Signed   By: Lowella Grip III M.D.   On: 06/09/2019 11:03     Assessment & Plan:  Plan  I am having Kaithlyn T. Kleckner start on clotrimazole-betamethasone. I am also having her maintain her mometasone, nystatin, labetalol, and amLODipine.  Meds ordered this encounter  Medications  . clotrimazole-betamethasone (LOTRISONE)  cream    Sig: Apply 1 application topically 2 (two) times daily.    Dispense:  30 g    Refill:  0    Problem List Items Addressed This Visit      Unprioritized   Essential hypertension    Well controlled, no changes to meds. Encouraged heart healthy diet such as the DASH diet and exercise as tolerated.       Hyperlipidemia LDL goal <100    Encouraged heart healthy diet, increase exercise, avoid trans fats, consider a krill oil cap daily      Immunity status testing   Relevant Orders   Hepatitis B surface antibody,quantitative   Measles/Mumps/Rubella Immunity   Vaginal itching   Relevant Medications   clotrimazole-betamethasone (LOTRISONE) cream   Other Relevant Orders   POCT Urinalysis Dipstick (Automated) (Completed)    Other Visit Diagnoses    Primary hypertension    -  Primary   Relevant Orders   Lipid panel   Comprehensive metabolic panel   Need for influenza vaccination       Relevant Orders   Flu Vaccine QUAD High Dose(Fluad) (Completed)   Hyperlipidemia, unspecified hyperlipidemia type       Relevant Orders   Lipid panel   Comprehensive metabolic panel   Encounter for screening mammogram for malignant neoplasm of breast       Breast cancer screening, high risk patient          Follow-up: Return in about 6 months (around 11/11/2020), or if symptoms worsen or fail to improve, for fasting, hyperlipidemia, hypertension.  [name], DO

## 2020-05-14 NOTE — Patient Instructions (Signed)
DASH Eating Plan DASH stands for "Dietary Approaches to Stop Hypertension." The DASH eating plan is a healthy eating plan that has been shown to reduce high blood pressure (hypertension). It may also reduce your risk for type 2 diabetes, heart disease, and stroke. The DASH eating plan may also help with weight loss. What are tips for following this plan?  General guidelines  Avoid eating more than 2,300 mg (milligrams) of salt (sodium) a day. If you have hypertension, you may need to reduce your sodium intake to 1,500 mg a day.  Limit alcohol intake to no more than 1 drink a day for nonpregnant women and 2 drinks a day for men. One drink equals 12 oz of beer, 5 oz of wine, or 1 oz of hard liquor.  Work with your health care provider to maintain a healthy body weight or to lose weight. Ask what an ideal weight is for you.  Get at least 30 minutes of exercise that causes your heart to beat faster (aerobic exercise) most days of the week. Activities may include walking, swimming, or biking.  Work with your health care provider or diet and nutrition specialist (dietitian) to adjust your eating plan to your individual calorie needs. Reading food labels   Check food labels for the amount of sodium per serving. Choose foods with less than 5 percent of the Daily Value of sodium. Generally, foods with less than 300 mg of sodium per serving fit into this eating plan.  To find whole grains, look for the word "whole" as the first word in the ingredient list. Shopping  Buy products labeled as "low-sodium" or "no salt added."  Buy fresh foods. Avoid canned foods and premade or frozen meals. Cooking  Avoid adding salt when cooking. Use salt-free seasonings or herbs instead of table salt or sea salt. Check with your health care provider or pharmacist before using salt substitutes.  Do not fry foods. Cook foods using healthy methods such as baking, boiling, grilling, and broiling instead.  Cook with  heart-healthy oils, such as olive, canola, soybean, or sunflower oil. Meal planning  Eat a balanced diet that includes: ? 5 or more servings of fruits and vegetables each day. At each meal, try to fill half of your plate with fruits and vegetables. ? Up to 6-8 servings of whole grains each day. ? Less than 6 oz of lean meat, poultry, or fish each day. A 3-oz serving of meat is about the same size as a deck of cards. One egg equals 1 oz. ? 2 servings of low-fat dairy each day. ? A serving of nuts, seeds, or beans 5 times each week. ? Heart-healthy fats. Healthy fats called Omega-3 fatty acids are found in foods such as flaxseeds and coldwater fish, like sardines, salmon, and mackerel.  Limit how much you eat of the following: ? Canned or prepackaged foods. ? Food that is high in trans fat, such as fried foods. ? Food that is high in saturated fat, such as fatty meat. ? Sweets, desserts, sugary drinks, and other foods with added sugar. ? Full-fat dairy products.  Do not salt foods before eating.  Try to eat at least 2 vegetarian meals each week.  Eat more home-cooked food and less restaurant, buffet, and fast food.  When eating at a restaurant, ask that your food be prepared with less salt or no salt, if possible. What foods are recommended? The items listed may not be a complete list. Talk with your dietitian about   what dietary choices are best for you. Grains Whole-grain or whole-wheat bread. Whole-grain or whole-wheat pasta. Brown rice. Oatmeal. Quinoa. Bulgur. Whole-grain and low-sodium cereals. Pita bread. Low-fat, low-sodium crackers. Whole-wheat flour tortillas. Vegetables Fresh or frozen vegetables (raw, steamed, roasted, or grilled). Low-sodium or reduced-sodium tomato and vegetable juice. Low-sodium or reduced-sodium tomato sauce and tomato paste. Low-sodium or reduced-sodium canned vegetables. Fruits All fresh, dried, or frozen fruit. Canned fruit in natural juice (without  added sugar). Meat and other protein foods Skinless chicken or turkey. Ground chicken or turkey. Pork with fat trimmed off. Fish and seafood. Egg whites. Dried beans, peas, or lentils. Unsalted nuts, nut butters, and seeds. Unsalted canned beans. Lean cuts of beef with fat trimmed off. Low-sodium, lean deli meat. Dairy Low-fat (1%) or fat-free (skim) milk. Fat-free, low-fat, or reduced-fat cheeses. Nonfat, low-sodium ricotta or cottage cheese. Low-fat or nonfat yogurt. Low-fat, low-sodium cheese. Fats and oils Soft margarine without trans fats. Vegetable oil. Low-fat, reduced-fat, or light mayonnaise and salad dressings (reduced-sodium). Canola, safflower, olive, soybean, and sunflower oils. Avocado. Seasoning and other foods Herbs. Spices. Seasoning mixes without salt. Unsalted popcorn and pretzels. Fat-free sweets. What foods are not recommended? The items listed may not be a complete list. Talk with your dietitian about what dietary choices are best for you. Grains Baked goods made with fat, such as croissants, muffins, or some breads. Dry pasta or rice meal packs. Vegetables Creamed or fried vegetables. Vegetables in a cheese sauce. Regular canned vegetables (not low-sodium or reduced-sodium). Regular canned tomato sauce and paste (not low-sodium or reduced-sodium). Regular tomato and vegetable juice (not low-sodium or reduced-sodium). Pickles. Olives. Fruits Canned fruit in a light or heavy syrup. Fried fruit. Fruit in cream or butter sauce. Meat and other protein foods Fatty cuts of meat. Ribs. Fried meat. Bacon. Sausage. Bologna and other processed lunch meats. Salami. Fatback. Hotdogs. Bratwurst. Salted nuts and seeds. Canned beans with added salt. Canned or smoked fish. Whole eggs or egg yolks. Chicken or turkey with skin. Dairy Whole or 2% milk, cream, and half-and-half. Whole or full-fat cream cheese. Whole-fat or sweetened yogurt. Full-fat cheese. Nondairy creamers. Whipped toppings.  Processed cheese and cheese spreads. Fats and oils Butter. Stick margarine. Lard. Shortening. Ghee. Bacon fat. Tropical oils, such as coconut, palm kernel, or palm oil. Seasoning and other foods Salted popcorn and pretzels. Onion salt, garlic salt, seasoned salt, table salt, and sea salt. Worcestershire sauce. Tartar sauce. Barbecue sauce. Teriyaki sauce. Soy sauce, including reduced-sodium. Steak sauce. Canned and packaged gravies. Fish sauce. Oyster sauce. Cocktail sauce. Horseradish that you find on the shelf. Ketchup. Mustard. Meat flavorings and tenderizers. Bouillon cubes. Hot sauce and Tabasco sauce. Premade or packaged marinades. Premade or packaged taco seasonings. Relishes. Regular salad dressings. Where to find more information:  National Heart, Lung, and Blood Institute: www.nhlbi.nih.gov  American Heart Association: www.heart.org Summary  The DASH eating plan is a healthy eating plan that has been shown to reduce high blood pressure (hypertension). It may also reduce your risk for type 2 diabetes, heart disease, and stroke.  With the DASH eating plan, you should limit salt (sodium) intake to 2,300 mg a day. If you have hypertension, you may need to reduce your sodium intake to 1,500 mg a day.  When on the DASH eating plan, aim to eat more fresh fruits and vegetables, whole grains, lean proteins, low-fat dairy, and heart-healthy fats.  Work with your health care provider or diet and nutrition specialist (dietitian) to adjust your eating plan to your   individual calorie needs. This information is not intended to replace advice given to you by your health care provider. Make sure you discuss any questions you have with your health care provider. Document Revised: 05/21/2017 Document Reviewed: 06/01/2016 Elsevier Patient Education  2020 [employer]  

## 2020-05-14 NOTE — Assessment & Plan Note (Signed)
Encouraged heart healthy diet, increase exercise, avoid trans fats, consider a krill oil cap daily 

## 2020-05-14 NOTE — Assessment & Plan Note (Signed)
Well controlled, no changes to meds. Encouraged heart healthy diet such as the DASH diet and exercise as tolerated.  °

## 2020-05-15 ENCOUNTER — Ambulatory Visit
Admission: RE | Admit: 2020-05-15 | Discharge: 2020-05-15 | Disposition: A | Discharge status: Home / Self Care | Payer: Medicare Other | Source: Ambulatory Visit | Attending: Family Medicine | Admitting: Family Medicine

## 2020-05-15 DIAGNOSIS — Z1231 Encounter for screening mammogram for malignant neoplasm of breast: Secondary | ICD-10-CM | POA: Diagnosis not present

## 2020-05-15 LAB — MEASLES/MUMPS/RUBELLA IMMUNITY
Mumps IgG: >300 AU/mL
Rubella: 9.22 Index
Rubeola IgG: >300 AU/mL

## 2020-05-15 LAB — HEPATITIS B SURFACE ANTIBODY, QUANTITATIVE: Hep B S AB Quant (Post): <5 m[IU]/mL — ABNORMAL LOW (ref >=10)

## 2020-05-21 ENCOUNTER — Ambulatory Visit (INDEPENDENT_AMBULATORY_CARE_PROVIDER_SITE_OTHER): Payer: Medicare Other

## 2020-05-21 ENCOUNTER — Other Ambulatory Visit: Payer: Self-pay

## 2020-05-21 DIAGNOSIS — Z23 Encounter for immunization: Secondary | ICD-10-CM | POA: Diagnosis not present

## 2020-06-11 ENCOUNTER — Other Ambulatory Visit: Payer: Self-pay | Admitting: Family Medicine

## 2020-06-11 DIAGNOSIS — I10 Essential (primary) hypertension: Secondary | ICD-10-CM

## 2020-06-20 ENCOUNTER — Ambulatory Visit (INDEPENDENT_AMBULATORY_CARE_PROVIDER_SITE_OTHER): Payer: Medicare Other | Admitting: *Deleted

## 2020-06-20 ENCOUNTER — Other Ambulatory Visit: Payer: Self-pay

## 2020-06-20 DIAGNOSIS — Z23 Encounter for immunization: Secondary | ICD-10-CM

## 2020-06-20 NOTE — Progress Notes (Signed)
Patient here for second hepatitis b vaccine per Dr. Laury Axon.  Vaccine given in left deltoid and patient tolerated well.  Patient has an appointment in May with Dr. Laury Axon and will see if she can get 3rd hep b at that time.

## 2020-07-10 DIAGNOSIS — Z23 Encounter for immunization: Secondary | ICD-10-CM | POA: Diagnosis not present

## 2020-10-28 ENCOUNTER — Telehealth: Payer: Self-pay | Admitting: Family Medicine

## 2020-10-28 NOTE — Telephone Encounter (Signed)
Pt, called with complain of sinus infection greenish yellow mucus, she would like an antibiotics prescription sent in for her   Please advice

## 2020-10-28 NOTE — Telephone Encounter (Signed)
Lvm for patient to call and make VV appt

## 2020-10-28 NOTE — Telephone Encounter (Signed)
Pt needs a virtual appointment for antibiotics

## 2020-10-29 ENCOUNTER — Other Ambulatory Visit: Payer: Self-pay

## 2020-10-29 ENCOUNTER — Telehealth (INDEPENDENT_AMBULATORY_CARE_PROVIDER_SITE_OTHER): Payer: Medicare Other | Admitting: Medical

## 2020-10-29 DIAGNOSIS — K0889 Other specified disorders of teeth and supporting structures: Secondary | ICD-10-CM | POA: Diagnosis not present

## 2020-10-29 DIAGNOSIS — J3489 Other specified disorders of nose and nasal sinuses: Secondary | ICD-10-CM | POA: Diagnosis not present

## 2020-10-29 DIAGNOSIS — R0981 Nasal congestion: Secondary | ICD-10-CM

## 2020-10-29 MED ORDER — BENZONATATE 100 MG PO CAPS: 100.0000 mg | ORAL_CAPSULE | Freq: Three times a day (TID) | ORAL | 0 refills | Status: DC | PRN

## 2020-10-29 MED ORDER — AMOXICILLIN-POT CLAVULANATE 875-125 MG PO TABS: 1.0000 | ORAL_TABLET | Freq: Two times a day (BID) | ORAL | 0 refills | Status: DC

## 2020-10-29 MED ORDER — FLUTICASONE PROPIONATE 50 MCG/ACT NA SUSP: 2.0000 | Freq: Every day | NASAL | 1 refills | Status: DC

## 2020-10-29 NOTE — Progress Notes (Signed)
Subjective:    Patient ID: Candice Bennett, female    DOB: September 17, 1945, 75 y.o.   MRN: [id number]  HPI  Virtual Visit via Video Note  I connected with Candice Bennett on 10/29/20 at 10:40 AM EDT by a video enabled telemedicine application and verified that I am speaking with the correct person using two identifiers.  Location: Patient: work Provider: office   I discussed the limitations of evaluation and management by telemedicine and the availability of in person appointments. The patient expressed understanding and agreed to proceed.  Pt did not check vitals. No 02 sat %.  History of Present Illness:  Pt states on Friday mild nasal congestion. Got worse on Saturday. Rt nostril felt blocked. Then later in day rt maxillary sinus felt painful. Pt also mentions early last week Monday, Tuesday and wed had some teeth pain on rt side.(Upper tooth in the back)   Yesterday evening when she was blowing nose had colored mucus with smell. Also bad taste in throat.  Pt had 3 covid vaccines in the past.  Pt had recent slight metallic taste to mouth transient.   Observations/Objective:  General-no acute distress, pleasant, oriented. Lungs- on inspection lungs appear unlabored. Neck- no tracheal deviation or jvd on inspection. Neuro- gross motor function appears intact. heent- maxillary sinus pressure.   Assessment and Plan: Recent upper respiratory type symptoms with right maxillary sinus pressure along with right tooth pain.  Will prescribe Augmentin to cover for sinus infection and possible tooth infection.  Prescribed Flonase for nasal congestion.  Intermittent mild cough recently.  Making benzonatate available if cough worsens.  Discussed patient's recent metallic taste in mouth.  She had concern for possible COVID.  She is vaccinated 3 times.  Recommended for caution sake that patient do have COVID testing.  Explained option of over-the-counter COVID test 1 test tonight and then  another tomorrow morning.  Patient prefers to get tested at site they can do rapid and PCR test.  I explained to patient that if she has positive test or PCR test pending then not to go to work.  Patient expressed understanding.  She will also notify us of any positive test results.  Follow-up in 7 to 10 days or as needed.  Time spent with patient today was   minutes which consisted of chart revdiew, discussing diagnosis, work up treatment and documentation.  Follow Up Instructions:    I discussed the assessment and treatment plan with the patient. The patient was provided an opportunity to ask questions and all were answered. The patient agreed with the plan and demonstrated an understanding of the instructions.   The patient was advised to call back or seek an in-person evaluation if the symptoms worsen or if the condition fails to improve as anticipated.  Time spent with patient today was 31  minutes which consisted of chart review, discussing diagnosis,  Treatment, answering her questions regarding possibility of COVID infection and documentation.   Mackie Pai, PA-C   Review of Systems  Constitutional: Negative for chills, fatigue and fever.  HENT: Positive for congestion, postnasal drip, sinus pressure and sinus pain. Negative for ear pain.   Respiratory: Positive for cough. Negative for chest tightness, shortness of breath and wheezing.        Occasional cough.  Cardiovascular: Negative for chest pain and palpitations.  Gastrointestinal: Negative for abdominal pain.  Musculoskeletal: Negative for back pain.       Pt had transient stuck left index and mid  finger transient sensation.   Neurological: Negative for dizziness, numbness and headaches.  Hematological: Negative for adenopathy. Does not bruise/bleed easily.  Psychiatric/Behavioral: Negative for behavioral problems, confusion and sleep disturbance. The patient is not nervous/anxious.     Past Medical History:   Diagnosis Date  . Follicular lymphoma ([place])   . OSA (obstructive sleep apnea)   . Sarcoidosis   . Unspecified essential hypertension      Social History   Socioeconomic History  . Marital status: Divorced    Spouse name: Not on file  . Number of children: 2  . Years of education: Not on file  . Highest education level: Not on file  Occupational History  . Occupation: [employer]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[place]  Tobacco Use  . Smoking status: Never Smoker  . Smokeless tobacco: Never Used  Substance and Sexual Activity  . Alcohol use: No  . Drug use: No  . Sexual activity: Not Currently    Partners: Male  Other Topics Concern  . Not on file  Social History Narrative   No regularly exercise   Live with daughter and her daughter   Social Determinants of Health   Financial Resource Strain: Not on file  Food Insecurity: Not on file  Transportation Needs: Not on file  Physical Activity: Not on file  Stress: Not on file  Social Connections: Not on file  Intimate Partner Violence: Not on file    Past Surgical History:  Procedure Laterality Date  . ABDOMINAL HYSTERECTOMY  1983  . CARPAL TUNNEL RELEASE    . LYMPH NODE BIOPSY    . port a cath insertion  2005  . PORT-A-CATH REMOVAL  2006  . TUBAL LIGATION  1978    Family History  Problem Relation Age of Onset  . Lung cancer Father   . Heart attack Father   . Diabetes Brother   . Colon cancer Brother   . Heart disease Mother   . Heart disease Sister 36       chf  . Dementia Sister   . Kidney disease Sister   . Hypertension Sister   . Multiple myeloma Sister   . Bell's palsy Sister   . Heart attack Sister 89  . Diabetes Sister     Allergies  Allergen Reactions  . Iohexol Itching  . Ivp Dye [Iodinated Diagnostic Agents] Itching    Current Outpatient Medications on File Prior to Visit  Medication Sig Dispense Refill  . amLODipine (NORVASC) 5 MG tablet Take 1 tablet (5 mg total) by  mouth daily. 90 tablet 1  . clotrimazole-betamethasone (LOTRISONE) cream Apply 1 application topically 2 (two) times daily. 30 g 0  . labetalol (NORMODYNE) 100 MG tablet Take 1 tablet (100 mg total) by mouth 2 (two) times daily. 180 tablet 1  . mometasone (ELOCON) 0.1 % cream APPLY  CREAM TOPICALLY TO AFFECTED AREA(S) ONCE DAILY 45 g 1  . nystatin (NYSTATIN) powder Apply topically 4 (four) times daily. 15 g 0   No current facility-administered medications on file prior to visit.    There were no vitals taken for this visit.      Objective:   Physical Exam        Assessment & Plan:

## 2020-10-29 NOTE — Patient Instructions (Signed)
Recent upper respiratory type symptoms with right maxillary sinus pressure along with right tooth pain.  Will prescribe Augmentin to cover for sinus infection and possible tooth infection.  Prescribed Flonase for nasal congestion.  Intermittent mild cough recently.  Making benzonatate available if cough worsens.  Discussed patient's recent metallic taste in mouth.  She had concern for possible COVID.  She is vaccinated 3 times.  Recommended for caution sake that patient do have COVID testing.  Explained option of over-the-counter COVID test 1 test tonight and then another tomorrow morning.  Patient prefers to get tested at site they can do rapid and PCR test.  I explained to patient that if she has positive test or PCR test pending then not to go to work.  Patient expressed understanding.  She will also notify us of any positive test results.  Follow-up in 7 to 10 days or as needed.

## 2020-11-11 ENCOUNTER — Ambulatory Visit: Payer: Medicare Other | Admitting: Family Medicine

## 2020-11-12 ENCOUNTER — Ambulatory Visit: Payer: Medicare Other | Admitting: Family Medicine

## 2020-11-22 ENCOUNTER — Encounter: Payer: Self-pay | Admitting: Family Medicine

## 2020-11-22 ENCOUNTER — Other Ambulatory Visit: Payer: Self-pay

## 2020-11-22 ENCOUNTER — Ambulatory Visit (INDEPENDENT_AMBULATORY_CARE_PROVIDER_SITE_OTHER): Payer: Medicare Other | Admitting: Family Medicine

## 2020-11-22 VITALS — BP 128/70 | HR 79 | Temp 98.5°F | Resp 18 | Ht 63.0 in | Wt 196.0 lb

## 2020-11-22 DIAGNOSIS — C8590 Non-Hodgkin lymphoma, unspecified, unspecified site: Secondary | ICD-10-CM | POA: Diagnosis not present

## 2020-11-22 DIAGNOSIS — L309 Dermatitis, unspecified: Secondary | ICD-10-CM

## 2020-11-22 DIAGNOSIS — J40 Bronchitis, not specified as acute or chronic: Secondary | ICD-10-CM | POA: Diagnosis not present

## 2020-11-22 DIAGNOSIS — E785 Hyperlipidemia, unspecified: Secondary | ICD-10-CM

## 2020-11-22 DIAGNOSIS — I10 Essential (primary) hypertension: Secondary | ICD-10-CM

## 2020-11-22 DIAGNOSIS — I700 Atherosclerosis of aorta: Secondary | ICD-10-CM | POA: Diagnosis not present

## 2020-11-22 MED ORDER — AZITHROMYCIN 250 MG PO TABS: ORAL_TABLET | ORAL | 0 refills | Status: AC

## 2020-11-22 MED ORDER — MOMETASONE FUROATE 0.1 % EX CREA: TOPICAL_CREAM | CUTANEOUS | 1 refills | Status: DC

## 2020-11-22 NOTE — Assessment & Plan Note (Addendum)
Check lipid today 

## 2020-11-22 NOTE — Patient Instructions (Signed)
Acute Bronchitis, Adult  Acute bronchitis is sudden or acute swelling of the air tubes (bronchi) in the lungs. Acute bronchitis causes these tubes to fill with mucus, which can make it hard to breathe. It can also cause coughing or wheezing. In adults, acute bronchitis usually goes away within 2 weeks. A cough caused by bronchitis may last up to 3 weeks. Smoking, allergies, and asthma can make the condition worse. What are the causes? This condition can be caused by germs and by substances that irritate the lungs, including:  Cold and flu viruses. The most common cause of this condition is the virus that causes the common cold.  Bacteria.  Substances that irritate the lungs, including: ? Smoke from cigarettes and other forms of tobacco. ? Dust and pollen. ? Fumes from chemical products, gases, or burned fuel. ? Other materials that pollute indoor or outdoor air.  Close contact with someone who has acute bronchitis. What increases the risk? The following factors may make you more likely to develop this condition:  A weak body's defense system, also called the immune system.  A condition that affects your lungs and breathing, such as asthma. What are the signs or symptoms? Common symptoms of this condition include:  Lung and breathing problems, such as: ? Coughing. This may bring up clear, yellow, or green mucus from your lungs (sputum). ? Wheezing. ? Having too much mucus in your lungs (chest congestion). ? Having shortness of breath.  A fever.  Chills.  Aches and pains, including: ? Tightness in your chest and other body aches. ? A sore throat. How is this diagnosed? This condition is usually diagnosed based on:  Your symptoms and medical history.  A physical exam. You may also have other tests, including tests to rule out other conditions, such pneumonia. These tests include:  A test of lung function.  Test of a mucus sample to look for the presence of  bacteria.  Tests to check the oxygen level in your blood.  Blood tests.  Chest X-ray. How is this treated? Most cases of acute bronchitis clear up over time without treatment. Your health care provider may recommend:  Drinking more fluids. This can thin your mucus, which may improve your breathing.  Taking a medicine for a fever or cough.  Using a device that gets medicine into your lungs (inhaler) to help improve breathing and control coughing.  Using a vaporizer or a humidifier. These are machines that add water to the air to help you breathe better. Follow these instructions at home: Activity  Get plenty of rest.  Return to your normal activities as told by your health care provider. Ask your health care provider what activities are safe for you. Lifestyle  Drink enough fluid to keep your urine pale yellow.  Do not drink alcohol.  Do not use any products that contain nicotine or tobacco, such as cigarettes, e-cigarettes, and chewing tobacco. If you need help quitting, ask your health care provider. Be aware that: ? Your bronchitis will get worse if you smoke or breathe in other people's smoke (secondhand smoke). ? Your lungs will heal faster if you quit smoking. General instructions  Take over-the-counter and prescription medicines only as told by your health care provider.  Use an inhaler, vaporizer, or humidifier as told by your health care provider.  If you have a sore throat, gargle with a salt-water mixture 3-4 times a day or as needed. To make a salt-water mixture, completely dissolve -1 tsp (3-6 g)   of salt in 1 cup (237 mL) of warm water.  Keep all follow-up visits as told by your health care provider. This is important.   How is this prevented? To lower your risk of getting this condition again:  Wash your hands often with soap and water. If soap and water are not available, use hand sanitizer.  Avoid contact with people who have cold symptoms.  Try not to  touch your mouth, nose, or eyes with your hands.  Avoid places where there are fumes from chemicals. Breathing these fumes will make your condition worse.  Get the flu shot every year.   Contact a health care provider if:  Your symptoms do not improve after 2 weeks of treatment.  You vomit more than once or twice.  You have symptoms of dehydration such as: ? Dark urine. ? Dry skin or eyes. ? Increased thirst. ? Headaches. ? Confusion. ? Muscle cramps. Get help right away if you:  Cough up blood.  Feel pain in your chest.  Have severe shortness of breath.  Faint or keep feeling like you are going to faint.  Have a severe headache.  Have fever or chills that get worse. These symptoms may represent a serious problem that is an emergency. Do not wait to see if the symptoms will go away. Get medical help right away. Call your local emergency services (911 in the [place] Do not drive yourself to the hospital. Summary  Acute bronchitis is sudden (acute) inflammation of the air tubes (bronchi) between the windpipe and the lungs. In adults, acute bronchitis usually goes away within 2 weeks, although coughing may last 3 weeks or longer  Take over-the-counter and prescription medicines only as told by your health care provider.  Drink enough fluid to keep your urine pale yellow.  Contact a health care provider if your symptoms do not improve after 2 weeks of treatment.  Get help right away if you cough up blood, faint, or have chest pain or shortness of breath. This information is not intended to replace advice given to you by your health care provider. Make sure you discuss any questions you have with your health care provider. Document Revised: 02/20/2019 Document Reviewed: 12/30/2018 Elsevier Patient Education  2021 Elsevier Inc.  

## 2020-11-22 NOTE — Progress Notes (Signed)
Patient ID: [name], female    DOB: 1946-06-12  Age: 75 y.o. MRN: [id number]    Subjective:  Subjective  HPI Candice Bennett presents for an office visit today. On 10/29/2020 Pt visit with Percell Miller and was dx with nasal congestion. She notes that it had resolved recently, but it had returned. She complains of productive cough. She notes that when she coughs, she heard rattling. She endorses having brown and tan phlegm, when she coughs. She states that she slept with the fan on all night. She endorses taking Tessalon.  She also complains of rash on her L forearm. She states putting alcohol and elocon on the rash. She also complains of R tooth pain, however she is planning on getting it fixed by her dentist. She denies any chest pain, SOB, fever, abdominal pain, chills, sore throat, dysuria, urinary incontinence, back pain, HA, or N/V/D at this time.  She endorses taking 5 mg of Norvasc PO Daily for her dx of HTN.  She states that her at home BP is "good". BP Readings from Last 3 Encounters:  11/22/20 128/70  05/14/20 110/70  11/14/19 118/72      Review of Systems  Constitutional: Negative for chills, fatigue and fever.  HENT: Positive for congestion. Negative for postnasal drip, rhinorrhea, sinus pressure, sinus pain and sore throat.        (+) Right tooth pain    Eyes: Negative for pain.  Respiratory: Positive for cough. Negative for shortness of breath and wheezing.   Cardiovascular: Negative for chest pain.  Gastrointestinal: Negative for abdominal pain, anal bleeding, constipation, diarrhea, nausea and vomiting.  Genitourinary: Negative for flank pain.  Musculoskeletal: Negative for back pain and neck pain.  Skin: Positive for rash (L forearm).  Neurological: Negative for seizures, weakness, light-headedness, numbness and headaches.    History Past Medical History:  Diagnosis Date  . Follicular lymphoma ([place])   . OSA (obstructive sleep apnea)   . Sarcoidosis   .  Unspecified essential hypertension     She has a past surgical history that includes Carpal tunnel release; Tubal ligation (1978); Lymph node biopsy; Abdominal hysterectomy (1983); port a cath insertion (2005); and Port-a-cath removal (2006).   Her family history includes Bell's palsy in her sister; Colon cancer in her brother; Dementia in her sister; Diabetes in her brother and sister; Heart attack in her father; Heart attack (age of onset: 79) in her sister; Heart disease in her mother; Heart disease (age of onset: 107) in her sister; Hypertension in her sister; Kidney disease in her sister; Lung cancer in her father; Multiple myeloma in her sister.She reports that she has never smoked. She has never used smokeless tobacco. She reports that she does not drink alcohol and does not use drugs.  Current Outpatient Medications on File Prior to Visit  Medication Sig Dispense Refill  . amLODipine (NORVASC) 5 MG tablet Take 1 tablet (5 mg total) by mouth daily. 90 tablet 1  . clotrimazole-betamethasone (LOTRISONE) cream Apply 1 application topically 2 (two) times daily. 30 g 0  . fluticasone (FLONASE) 50 MCG/ACT nasal spray Place 2 sprays into both nostrils daily. 16 g 1  . labetalol (NORMODYNE) 100 MG tablet Take 1 tablet (100 mg total) by mouth 2 (two) times daily. 180 tablet 1  . nystatin (NYSTATIN) powder Apply topically 4 (four) times daily. 15 g 0   No current facility-administered medications on file prior to visit.     Objective:  Objective  Physical Exam Vitals  and nursing note reviewed.  Constitutional:      General: She is not in acute distress.    Appearance: Normal appearance. She is well-developed. She is not ill-appearing.  HENT:     Head: Normocephalic and atraumatic.     Right Ear: External ear normal.     Left Ear: External ear normal.     Nose: Nose normal.  Eyes:     General:        Right eye: No discharge.        Left eye: No discharge.     Extraocular Movements:  Extraocular movements intact.     Pupils: Pupils are equal, round, and reactive to light.  Cardiovascular:     Rate and Rhythm: Normal rate and regular rhythm.     Pulses: Normal pulses.     Heart sounds: Normal heart sounds. No murmur heard. No friction rub. No gallop.   Pulmonary:     Effort: Pulmonary effort is normal. No respiratory distress.     Breath sounds: Normal breath sounds. No stridor. No wheezing, rhonchi or rales.  Chest:     Chest wall: No tenderness.  Abdominal:     General: Bowel sounds are normal. There is no distension.     Palpations: Abdomen is soft. There is no mass.     Tenderness: There is no abdominal tenderness. There is no guarding or rebound.     Hernia: No hernia is present.  Musculoskeletal:        General: Normal range of motion.     Cervical back: Normal range of motion and neck supple.     Right lower leg: No edema.     Left lower leg: No edema.  Skin:    General: Skin is warm and dry.  Neurological:     Mental Status: She is alert and oriented to person, place, and time.  Psychiatric:        Behavior: Behavior normal.        Thought Content: Thought content normal.    BP 128/70 (BP Location: Right Arm, Patient Position: Sitting, Cuff Size: Normal)   Pulse 79   Temp 98.5 F (36.9 C) (Oral)   Resp 18   Ht 5' 3"  (1.6 m)   Wt 196 lb (88.9 kg)   SpO2 95%   BMI 34.72 kg/m  Wt Readings from Last 3 Encounters:  11/22/20 196 lb (88.9 kg)  05/14/20 199 lb 6.4 oz (90.4 kg)  11/14/19 188 lb 6.4 oz (85.5 kg)     Lab Results  Component Value Date   WBC 10.4 07/09/2017   HGB 12.3 07/09/2017   HCT 38.7 07/09/2017   PLT 278.0 07/09/2017   GLUCOSE 94 05/14/2020   CHOL 163 05/14/2020   TRIG 79.0 05/14/2020   HDL 63.00 05/14/2020   LDLCALC 84 05/14/2020   ALT 11 05/14/2020   AST 15 05/14/2020   NA 137 05/14/2020   K 4.3 05/14/2020   CL 102 05/14/2020   CREATININE 0.78 05/14/2020   BUN 10 05/14/2020   CO2 31 05/14/2020   TSH 0.86  04/07/2011   MICROALBUR 1.5 08/27/2017    MM 3D SCREEN BREAST BILATERAL  Result Date: 05/19/2020 CLINICAL DATA:  Screening. EXAM: DIGITAL SCREENING BILATERAL MAMMOGRAM WITH TOMO AND CAD COMPARISON:  Previous exam(s). ACR Breast Density Category b: There are scattered areas of fibroglandular density. FINDINGS: There are no findings suspicious for malignancy. Images were processed with CAD. IMPRESSION: No mammographic evidence of malignancy. A result letter of  this screening mammogram will be mailed directly to the patient. RECOMMENDATION: Screening mammogram in one year. (Code:SM-B-01Y) BI-RADS CATEGORY  1: Negative. Electronically Signed   By: Lajean Manes M.D.   On: 05/19/2020 15:05     Assessment & Plan:  Plan    Meds ordered this encounter  Medications  . mometasone (ELOCON) 0.1 % cream    Sig: APPLY  CREAM TOPICALLY TO AFFECTED AREA(S) ONCE DAILY    Dispense:  45 g    Refill:  1    Please consider 90 day supplies to promote better adherence  . azithromycin (ZITHROMAX) 250 MG tablet    Sig: Take 2 tablets on day 1, then 1 tablet daily on days 2 through 5    Dispense:  6 tablet    Refill:  0    Problem List Items Addressed This Visit      Unprioritized   Aortic atherosclerosis ([place])    Check lipid today       Bronchitis    z pack  con't coricidin  She has tessalon at home too covid test done       Relevant Medications   azithromycin (ZITHROMAX) 250 MG tablet   Other Relevant Orders   Novel Coronavirus, NAA ([facility])   Essential hypertension    Well controlled, no changes to meds. Encouraged heart healthy diet such as the DASH diet and exercise as tolerated.       Hyperlipidemia LDL goal <100    Encouraged heart healthy diet, increase exercise, avoid trans fats, consider a krill oil cap daily      Morbid obesity ([facility])   Non Hodgkin's lymphoma ([place])    Last ct showed no reoccurrence 2015 Pt chose no more screening       Relevant Medications   azithromycin  (ZITHROMAX) 250 MG tablet   Non-Hodgkin's lymphoma ([facility])   Relevant Medications   azithromycin (ZITHROMAX) 250 MG tablet    Other Visit Diagnoses    Primary hypertension    -  Primary   Relevant Orders   Lipid panel   Comprehensive metabolic panel   Hyperlipidemia, unspecified hyperlipidemia type       Relevant Orders   Lipid panel   Comprehensive metabolic panel   Eczema, unspecified type       Relevant Medications   mometasone (ELOCON) 0.1 % cream      Follow-up: Return in about 6 months (around 05/24/2021), or if symptoms worsen or fail to improve, for hypertension, hyperlipidemia.  . I,Gordon Zheng,acting as a scribe for [employer], DO.,have documented all relevant documentation on the behalf of [name], DO,as directed by  [name], DO while in the presence of [place], DO, have reviewed all documentation for this visit. The documentation on 11/22/20 for the exam, diagnosis, procedures, and orders are all accurate and complete.

## 2020-11-22 NOTE — Assessment & Plan Note (Signed)
Well controlled, no changes to meds. Encouraged heart healthy diet such as the DASH diet and exercise as tolerated.  °

## 2020-11-22 NOTE — Assessment & Plan Note (Addendum)
Encouraged heart healthy diet, increase exercise, avoid trans fats, consider a krill oil cap daily 

## 2020-11-22 NOTE — Assessment & Plan Note (Signed)
z pack  con't coricidin  She has tessalon at home too covid test done

## 2020-11-22 NOTE — Assessment & Plan Note (Signed)
Last ct showed no reoccurrence 2015 Pt chose no more screening

## 2020-11-24 LAB — NOVEL CORONAVIRUS, NAA: SARS-CoV-2, NAA: NOT DETECTED

## 2020-11-24 LAB — SARS-COV-2, NAA 2 DAY TAT

## 2020-11-25 LAB — COMPREHENSIVE METABOLIC PANEL
ALT: 12 U/L (ref 0–35)
AST: 16 U/L (ref 0–37)
Albumin: 4.1 g/dL (ref 3.5–5.2)
Alkaline Phosphatase: 77 U/L (ref 39–117)
BUN: 13 mg/dL (ref 6–23)
CO2: 27 mEq/L (ref 19–32)
Calcium: 9 mg/dL (ref 8.4–10.5)
Chloride: 103 mEq/L (ref 96–112)
Creatinine, Ser: 0.78 mg/dL (ref 0.40–1.20)
GFR: 75.43 mL/min (ref >60.00)
Glucose, Bld: 98 mg/dL (ref 70–99)
Potassium: 4.1 mEq/L (ref 3.5–5.1)
Sodium: 139 mEq/L (ref 135–145)
Total Bilirubin: 0.4 mg/dL (ref 0.2–1.2)
Total Protein: 7.5 g/dL (ref 6.0–8.3)

## 2020-11-25 LAB — LIPID PANEL
Cholesterol: 173 mg/dL (ref 0–200)
HDL: 59.1 mg/dL (ref >39.00)
LDL Cholesterol: 98 mg/dL (ref 0–99)
NonHDL: 113.86
Total CHOL/HDL Ratio: 3
Triglycerides: 81 mg/dL (ref 0.0–149.0)
VLDL: 16.2 mg/dL (ref 0.0–40.0)

## 2020-12-13 ENCOUNTER — Other Ambulatory Visit: Payer: Self-pay | Admitting: Family Medicine

## 2020-12-13 DIAGNOSIS — I10 Essential (primary) hypertension: Secondary | ICD-10-CM

## 2020-12-13 DIAGNOSIS — N898 Other specified noninflammatory disorders of vagina: Secondary | ICD-10-CM

## 2020-12-15 ENCOUNTER — Other Ambulatory Visit: Payer: Self-pay | Admitting: Family Medicine

## 2020-12-15 DIAGNOSIS — I10 Essential (primary) hypertension: Secondary | ICD-10-CM

## 2021-01-02 ENCOUNTER — Telehealth: Payer: Self-pay | Admitting: Family Medicine

## 2021-01-02 NOTE — Telephone Encounter (Signed)
Attempted to schedule AWV. Unable to LVM.  Will try at later time.  

## 2021-02-07 ENCOUNTER — Ambulatory Visit (INDEPENDENT_AMBULATORY_CARE_PROVIDER_SITE_OTHER): Payer: Medicare Other | Admitting: Family

## 2021-02-07 ENCOUNTER — Other Ambulatory Visit: Payer: Self-pay

## 2021-02-07 VITALS — BP 136/79 | HR 80 | Temp 98.6°F | Resp 16 | Ht 63.0 in | Wt 192.0 lb

## 2021-02-07 DIAGNOSIS — I10 Essential (primary) hypertension: Secondary | ICD-10-CM

## 2021-02-07 DIAGNOSIS — R42 Dizziness and giddiness: Secondary | ICD-10-CM

## 2021-02-07 DIAGNOSIS — R2681 Unsteadiness on feet: Secondary | ICD-10-CM | POA: Diagnosis not present

## 2021-02-07 NOTE — Patient Instructions (Addendum)
Please complete lab work prior to leaving. Complete CT head on the first floor.  Go to the ER if you develop recurrent difficulty walking/dizziness.

## 2021-02-07 NOTE — Progress Notes (Signed)
Subjective:   By signing my name below, I, Shehryar Baig, attest that this documentation has been prepared under the direction and in the presence of Debbrah Alar NP, 02/07/2021    Patient ID: Candice Bennett, female    DOB: Jan 18, 1946, 75 y.o.   MRN: [id number]  Chief Complaint  Patient presents with   Dizziness    Complains of dizziness in the last 2 weeks.     HPI Patient is in today for office visit.  Dizziness- She complains of vertigo for the past 2 weeks. 2 weeks ago she felt slightly dizzy and since then her symptoms have worsened and she felts dizzy for most of the day. Her symptoms have improved for the past 2 days. Before her episodes of dizziness happen her legs feel heavy. Her right leg feels worse then her left leg. She has no past history of vertigo. She feels steady while walking at this time.  She also notes having mild pain on the bottom of her feet.  She also reports having 2 episodes of spasms in her second toe digit.  She denies having any leg swelling or episodes of slurred speech.   Health Maintenance Due  Topic Date Due   Zoster Vaccines- Shingrix (1 of 2) Never done   COVID-19 Vaccine (3 - Pfizer risk series) 01/21/2020   INFLUENZA VACCINE  01/20/2021    Past Medical History:  Diagnosis Date   Follicular lymphoma (HCC)    OSA (obstructive sleep apnea)    Sarcoidosis    Unspecified essential hypertension     Past Surgical History:  Procedure Laterality Date   ABDOMINAL HYSTERECTOMY  1983   CARPAL TUNNEL RELEASE     LYMPH NODE BIOPSY     port a cath insertion  2005   PORT-A-CATH REMOVAL  2006   TUBAL LIGATION  1978    Family History  Problem Relation Age of Onset   Lung cancer Father    Heart attack Father    Diabetes Brother    Colon cancer Brother    Heart disease Mother    Heart disease Sister 76       chf   Dementia Sister    Kidney disease Sister    Hypertension Sister    Multiple myeloma Sister    Bell's palsy Sister     Heart attack Sister 55   Diabetes Sister     Social History   Socioeconomic History   Marital status: Divorced    Spouse name: Not on file   Number of children: 2   Years of education: Not on file   Highest education level: Not on file  Occupational History   Occupation: Therapist, 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[place]  Tobacco Use   Smoking status: Never   Smokeless tobacco: Never  Substance and Sexual Activity   Alcohol use: No   Drug use: No   Sexual activity: Not Currently    Partners: Male  Other Topics Concern   Not on file  Social History Narrative   No regularly exercise   Live with daughter and her daughter   Social Determinants of Health   Financial Resource Strain: Not on file  Food Insecurity: Not on file  Transportation Needs: Not on file  Physical Activity: Not on file  Stress: Not on file  Social Connections: Not on file  Intimate Partner Violence: Not on file    Outpatient Medications Prior to Visit  Medication Sig Dispense  Refill   amLODipine (NORVASC) 5 MG tablet Take 1 tablet by mouth once daily 90 tablet 0   clotrimazole-betamethasone (LOTRISONE) cream APPLY  CREAM TOPICALLY TWICE DAILY 45 g 0   fluticasone (FLONASE) 50 MCG/ACT nasal spray Place 2 sprays into both nostrils daily. 16 g 1   labetalol (NORMODYNE) 100 MG tablet Take 1 tablet by mouth twice daily 180 tablet 1   mometasone (ELOCON) 0.1 % cream APPLY  CREAM TOPICALLY TO AFFECTED AREA(S) ONCE DAILY 45 g 1   nystatin (NYSTATIN) powder Apply topically 4 (four) times daily. 15 g 0   No facility-administered medications prior to visit.    Allergies  Allergen Reactions   Iohexol Itching   Ivp Dye [Iodinated Diagnostic Agents] Itching    Review of Systems  Cardiovascular:  Negative for leg swelling.  Musculoskeletal:  Positive for myalgias (bilateral lower extremities, R>L, bottom of both feet).  Neurological:  Positive for dizziness. Negative for speech change.       Objective:    Physical Exam Constitutional:      General: She is not in acute distress.    Appearance: Normal appearance. She is not ill-appearing.  HENT:     Head: Normocephalic and atraumatic.     Right Ear: External ear normal.     Left Ear: External ear normal.  Eyes:     Extraocular Movements: Extraocular movements intact.     Pupils: Pupils are equal, round, and reactive to light.  Neck:     Vascular: No carotid bruit.  Cardiovascular:     Rate and Rhythm: Normal rate and regular rhythm.     Pulses:          Carotid pulses are 2+ on the right side and 2+ on the left side.    Heart sounds: Normal heart sounds. No murmur heard.   No gallop.  Pulmonary:     Effort: Pulmonary effort is normal.     Breath sounds: Normal breath sounds.  Musculoskeletal:     Comments: 5/5 strength in both upper and lower extremities  Lymphadenopathy:     Cervical: No cervical adenopathy.  Skin:    General: Skin is warm and dry.  Neurological:     Mental Status: She is alert and oriented to person, place, and time.     Cranial Nerves: No cranial nerve deficit, dysarthria or facial asymmetry.     Comments: Negative Dix-Hallpike test  Psychiatric:        Behavior: Behavior normal.    BP 136/79 (BP Location: Right Arm, Patient Position: Sitting, Cuff Size: Small)   Pulse 80   Temp 98.6 F (37 C) (Oral)   Resp 16   Ht 5' 3"  (1.6 m)   Wt 192 lb (87.1 kg)   SpO2 100%   BMI 34.01 kg/m  Wt Readings from Last 3 Encounters:  02/07/21 192 lb (87.1 kg)  11/22/20 196 lb (88.9 kg)  05/14/20 199 lb 6.4 oz (90.4 kg)       Assessment & Plan:   Problem List Items Addressed This Visit       Unprioritized   Essential (primary) hypertension   Relevant Orders   ECHO COMPLETE (BACK OFFICE)   Dizziness   Relevant Orders   CT HEAD WO CONTRAST (5MM)   Comp Met (CMET) (Completed)   CBC with Differential/Platelet (Completed)   US Carotid Duplex Bilateral   ECHO COMPLETE (BACK OFFICE)    Other Visit Diagnoses     Unsteady gait    -  Primary   Relevant Orders   CT HEAD WO CONTRAST (5MM)   Comp Met (CMET) (Completed)   CBC with Differential/Platelet (Completed)        No orders of the defined types were placed in this encounter.   I, Nance Pear, NP, personally preformed the services described in this documentation.  All medical record entries made by the scribe were at my direction and in my presence.  I have reviewed the chart and discharge instructions (if applicable) and agree that the record reflects my personal performance and is accurate and complete. 02/07/2021   I,Shehryar Baig,acting as a Education administrator for Nance Pear, NP.,have documented all relevant documentation on the behalf of Nance Pear, NP,as directed by  Nance Pear, NP while in the presence of Nance Pear, NP.   Nance Pear, NP

## 2021-02-08 LAB — COMPREHENSIVE METABOLIC PANEL
AG Ratio: 1.2 (calc) (ref 1.0–2.5)
ALT: 15 U/L (ref 6–29)
AST: 17 U/L (ref 10–35)
Albumin: 4.3 g/dL (ref 3.6–5.1)
Alkaline phosphatase (APISO): 77 U/L (ref 37–153)
BUN: 15 mg/dL (ref 7–25)
CO2: 29 mmol/L (ref 20–32)
Calcium: 9.6 mg/dL (ref 8.6–10.4)
Chloride: 102 mmol/L (ref 98–110)
Creat: 0.74 mg/dL (ref 0.60–1.00)
Globulin: 3.5 g/dL (calc) (ref 1.9–3.7)
Glucose, Bld: 92 mg/dL (ref 65–99)
Potassium: 4.1 mmol/L (ref 3.5–5.3)
Sodium: 139 mmol/L (ref 135–146)
Total Bilirubin: 0.5 mg/dL (ref 0.2–1.2)
Total Protein: 7.8 g/dL (ref 6.1–8.1)

## 2021-02-08 LAB — CBC WITH DIFFERENTIAL/PLATELET
Absolute Monocytes: 538 cells/uL (ref 200–950)
Basophils Absolute: 70 cells/uL (ref 0–200)
Basophils Relative: 1.1 %
Eosinophils Absolute: 147 cells/uL (ref 15–500)
Eosinophils Relative: 2.3 %
HCT: 38.5 % (ref 35.0–45.0)
Hemoglobin: 11.9 g/dL (ref 11.7–15.5)
Lymphs Abs: 3290 cells/uL (ref 850–3900)
MCH: 24.9 pg — ABNORMAL LOW (ref 27.0–33.0)
MCHC: 30.9 g/dL — ABNORMAL LOW (ref 32.0–36.0)
MCV: 80.5 fL (ref 80.0–100.0)
MPV: 9.3 fL (ref 7.5–12.5)
Monocytes Relative: 8.4 %
Neutro Abs: 2355 cells/uL (ref 1500–7800)
Neutrophils Relative %: 36.8 %
Platelets: 357 10*3/uL (ref 140–400)
RBC: 4.78 10*6/uL (ref 3.80–5.10)
RDW: 14.6 % (ref 11.0–15.0)
Total Lymphocyte: 51.4 %
WBC: 6.4 10*3/uL (ref 3.8–10.8)

## 2021-02-09 DIAGNOSIS — R42 Dizziness and giddiness: Secondary | ICD-10-CM | POA: Insufficient documentation

## 2021-02-09 NOTE — Assessment & Plan Note (Signed)
BP Readings from Last 3 Encounters:  02/07/21 136/79  11/22/20 128/70  05/14/20 110/70   BP at goal. Continue amlodipine '5mg'$  once daily.

## 2021-02-09 NOTE — Assessment & Plan Note (Signed)
Concerning for subacute TIA/CVA.  Will order work up as below.

## 2021-02-10 ENCOUNTER — Ambulatory Visit (HOSPITAL_BASED_OUTPATIENT_CLINIC_OR_DEPARTMENT_OTHER)
Admission: RE | Admit: 2021-02-10 | Discharge: 2021-02-10 | Disposition: A | Discharge status: Home / Self Care | Payer: Medicare Other | Source: Ambulatory Visit | Attending: Family | Admitting: Family

## 2021-02-10 ENCOUNTER — Other Ambulatory Visit: Payer: Self-pay

## 2021-02-10 DIAGNOSIS — R55 Syncope and collapse: Secondary | ICD-10-CM | POA: Diagnosis not present

## 2021-02-10 DIAGNOSIS — R2681 Unsteadiness on feet: Secondary | ICD-10-CM | POA: Diagnosis not present

## 2021-02-10 DIAGNOSIS — R42 Dizziness and giddiness: Secondary | ICD-10-CM

## 2021-02-10 DIAGNOSIS — I10 Essential (primary) hypertension: Secondary | ICD-10-CM | POA: Diagnosis not present

## 2021-02-10 DIAGNOSIS — I6523 Occlusion and stenosis of bilateral carotid arteries: Secondary | ICD-10-CM | POA: Diagnosis not present

## 2021-02-11 ENCOUNTER — Telehealth: Payer: Self-pay | Admitting: Family

## 2021-02-11 ENCOUNTER — Ambulatory Visit (HOSPITAL_BASED_OUTPATIENT_CLINIC_OR_DEPARTMENT_OTHER): Payer: Medicare Other

## 2021-02-11 NOTE — Telephone Encounter (Signed)
Please advise pt that I reviewed her CT.  No evidence of stroke.  There was note of some sinus congestion. Is she having any sinus pain?   Also, both carotid arteries are open. No blockage noted.   I would recommend that she add aspirin '81mg'$  once daily for stroke prevention however. It is possible that she could have had a TIA which is a mini stroke and does not show up on imaging.

## 2021-02-12 MED ORDER — AMOXICILLIN-POT CLAVULANATE 875-125 MG PO TABS: 1.0000 | ORAL_TABLET | Freq: Two times a day (BID) | ORAL | 0 refills | Status: DC

## 2021-02-12 NOTE — Telephone Encounter (Signed)
OK, I would like her to start augmentin please. Rx sent.

## 2021-02-13 NOTE — Telephone Encounter (Signed)
Patient called back, she stated she had picked up her sinuses medication from her pharmacy yesterday.

## 2021-02-13 NOTE — Telephone Encounter (Signed)
Lvm for patient to call back, needs to be made aware that [facility] sent a prescription for her sinuses

## 2021-02-28 ENCOUNTER — Other Ambulatory Visit: Payer: Self-pay

## 2021-02-28 ENCOUNTER — Ambulatory Visit (HOSPITAL_BASED_OUTPATIENT_CLINIC_OR_DEPARTMENT_OTHER)
Admission: RE | Admit: 2021-02-28 | Discharge: 2021-02-28 | Disposition: A | Discharge status: Home / Self Care | Payer: Medicare Other | Source: Ambulatory Visit | Attending: Family | Admitting: Family

## 2021-02-28 DIAGNOSIS — I10 Essential (primary) hypertension: Secondary | ICD-10-CM

## 2021-02-28 DIAGNOSIS — R42 Dizziness and giddiness: Secondary | ICD-10-CM | POA: Diagnosis not present

## 2021-03-01 LAB — ECHOCARDIOGRAM COMPLETE
AR max vel: 2.5 cm2
AV Area VTI: 2.16 cm2
AV Area mean vel: 2.08 cm2
AV Mean grad: 4 mmHg
AV Peak grad: 6.6 mmHg
Ao pk vel: 1.28 m/s
Area-P 1/2: 2.63 cm2
S' Lateral: 2.7 cm
Single Plane A4C EF: 65.1 %

## 2021-03-11 ENCOUNTER — Other Ambulatory Visit: Payer: Self-pay | Admitting: Family Medicine

## 2021-03-11 DIAGNOSIS — I10 Essential (primary) hypertension: Secondary | ICD-10-CM

## 2021-06-09 ENCOUNTER — Other Ambulatory Visit: Payer: Self-pay | Admitting: Family Medicine

## 2021-06-09 DIAGNOSIS — I10 Essential (primary) hypertension: Secondary | ICD-10-CM

## 2021-06-10 ENCOUNTER — Ambulatory Visit: Payer: Medicare Other | Admitting: Family Medicine

## 2021-06-10 NOTE — Progress Notes (Incomplete)
Subjective:   By signing my name below, I, Shehryar Baig, attest that this documentation has been prepared under the direction and in the presence of Dr. Roma Schanz, DO. 06/10/2021    Patient ID: Candice Bennett, female    DOB: Jan 25, 1946, 75 y.o.   MRN: [id number]  No chief complaint on file.   HPI Patient is in today for a office visit.    Past Medical History:  Diagnosis Date   Follicular lymphoma ([place])    OSA (obstructive sleep apnea)    Sarcoidosis    Unspecified essential hypertension     Past Surgical History:  Procedure Laterality Date   ABDOMINAL HYSTERECTOMY  1983   CARPAL TUNNEL RELEASE     LYMPH NODE BIOPSY     port a cath insertion  2005   PORT-A-CATH REMOVAL  2006   TUBAL LIGATION  1978    Family History  Problem Relation Age of Onset   Lung cancer Father    Heart attack Father    Diabetes Brother    Colon cancer Brother    Heart disease Mother    Heart disease Sister 100       chf   Dementia Sister    Kidney disease Sister    Hypertension Sister    Multiple myeloma Sister    Bell's palsy Sister    Heart attack Sister 51   Diabetes Sister     Social History   Socioeconomic History   Marital status: Divorced    Spouse name: Not on file   Number of children: 2   Years of education: Not on file   Highest education level: Not on file  Occupational History   Occupation: Therapist, 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[place]  Tobacco Use   Smoking status: Never   Smokeless tobacco: Never  Substance and Sexual Activity   Alcohol use: No   Drug use: No   Sexual activity: Not Currently    Partners: Male  Other Topics Concern   Not on file  Social History Narrative   No regularly exercise   Live with daughter and her daughter   Social Determinants of Health   Financial Resource Strain: Not on file  Food Insecurity: Not on file  Transportation Needs: Not on file  Physical Activity: Not on file  Stress: Not on file   Social Connections: Not on file  Intimate Partner Violence: Not on file    Outpatient Medications Prior to Visit  Medication Sig Dispense Refill   amLODipine (NORVASC) 5 MG tablet Take 1 tablet by mouth once daily 90 tablet 1   amoxicillin-clavulanate (AUGMENTIN) 875-125 MG tablet Take 1 tablet by mouth 2 (two) times daily. 20 tablet 0   clotrimazole-betamethasone (LOTRISONE) cream APPLY  CREAM TOPICALLY TWICE DAILY 45 g 0   fluticasone (FLONASE) 50 MCG/ACT nasal spray Place 2 sprays into both nostrils daily. 16 g 1   labetalol (NORMODYNE) 100 MG tablet Take 1 tablet by mouth twice daily 180 tablet 0   mometasone (ELOCON) 0.1 % cream APPLY  CREAM TOPICALLY TO AFFECTED AREA(S) ONCE DAILY 45 g 1   nystatin (NYSTATIN) powder Apply topically 4 (four) times daily. 15 g 0   No facility-administered medications prior to visit.    Allergies  Allergen Reactions   Iohexol Itching   Ivp Dye [Iodinated Diagnostic Agents] Itching    ROS     Objective:    Physical Exam Constitutional:  General: She is not in acute distress.    Appearance: Normal appearance. She is not ill-appearing.  HENT:     Head: Normocephalic and atraumatic.     Right Ear: External ear normal.     Left Ear: External ear normal.  Eyes:     Extraocular Movements: Extraocular movements intact.     Pupils: Pupils are equal, round, and reactive to light.  Cardiovascular:     Rate and Rhythm: Normal rate and regular rhythm.     Heart sounds: Normal heart sounds. No murmur heard.   No gallop.  Pulmonary:     Effort: Pulmonary effort is normal. No respiratory distress.     Breath sounds: Normal breath sounds. No wheezing or rales.  Skin:    General: Skin is warm and dry.  Neurological:     Mental Status: She is alert and oriented to person, place, and time.  Psychiatric:        Behavior: Behavior normal.        Judgment: Judgment normal.    There were no vitals taken for this visit. Wt Readings from Last 3  Encounters:  02/07/21 192 lb (87.1 kg)  11/22/20 196 lb (88.9 kg)  05/14/20 199 lb 6.4 oz (90.4 kg)    Diabetic Foot Exam - Simple   No data filed    Lab Results  Component Value Date   WBC 6.4 02/07/2021   HGB 11.9 02/07/2021   HCT 38.5 02/07/2021   PLT 357 02/07/2021   GLUCOSE 92 02/07/2021   CHOL 173 11/22/2020   TRIG 81.0 11/22/2020   HDL 59.10 11/22/2020   LDLCALC 98 11/22/2020   ALT 15 02/07/2021   AST 17 02/07/2021   NA 139 02/07/2021   K 4.1 02/07/2021   CL 102 02/07/2021   CREATININE 0.74 02/07/2021   BUN 15 02/07/2021   CO2 29 02/07/2021   TSH 0.86 04/07/2011   MICROALBUR 1.5 08/27/2017    Lab Results  Component Value Date   TSH 0.86 04/07/2011   Lab Results  Component Value Date   WBC 6.4 02/07/2021   HGB 11.9 02/07/2021   HCT 38.5 02/07/2021   MCV 80.5 02/07/2021   PLT 357 02/07/2021   Lab Results  Component Value Date   NA 139 02/07/2021   K 4.1 02/07/2021   CO2 29 02/07/2021   GLUCOSE 92 02/07/2021   BUN 15 02/07/2021   CREATININE 0.74 02/07/2021   BILITOT 0.5 02/07/2021   ALKPHOS 77 11/22/2020   AST 17 02/07/2021   ALT 15 02/07/2021   PROT 7.8 02/07/2021   ALBUMIN 4.1 11/22/2020   CALCIUM 9.6 02/07/2021   GFR 75.43 11/22/2020   Lab Results  Component Value Date   CHOL 173 11/22/2020   Lab Results  Component Value Date   HDL 59.10 11/22/2020   Lab Results  Component Value Date   LDLCALC 98 11/22/2020   Lab Results  Component Value Date   TRIG 81.0 11/22/2020   Lab Results  Component Value Date   CHOLHDL 3 11/22/2020   No results found for: HGBA1C     Assessment & Plan:   Problem List Items Addressed This Visit   None    No orders of the defined types were placed in this encounter.   I, Dr. Roma Schanz, DO, personally preformed the services described in this documentation.  All medical record entries made by the scribe were at my direction and in my presence.  I have reviewed the chart and  discharge  instructions (if applicable) and agree that the record reflects my personal performance and is accurate and complete. 06/10/2021   I,Shehryar Baig,acting as a scribe for [name], DO.,have documented all relevant documentation on the behalf of [name], DO,as directed by  [name], DO while in the presence of [name], DO.   Shehryar [employer]

## 2021-06-12 ENCOUNTER — Ambulatory Visit (INDEPENDENT_AMBULATORY_CARE_PROVIDER_SITE_OTHER): Payer: Medicare Other | Admitting: Family Medicine

## 2021-06-12 ENCOUNTER — Encounter: Payer: Self-pay | Admitting: Family Medicine

## 2021-06-12 VITALS — BP 122/78 | HR 75 | Temp 98.6°F | Resp 18 | Ht 63.0 in | Wt 191.4 lb

## 2021-06-12 DIAGNOSIS — Z23 Encounter for immunization: Secondary | ICD-10-CM | POA: Diagnosis not present

## 2021-06-12 DIAGNOSIS — E785 Hyperlipidemia, unspecified: Secondary | ICD-10-CM

## 2021-06-12 DIAGNOSIS — I10 Essential (primary) hypertension: Secondary | ICD-10-CM

## 2021-06-12 NOTE — Progress Notes (Signed)
Subjective:   By signing my name below, I, Shehryar Baig, attest that this documentation has been prepared under the direction and in the presence of Dr. Roma Schanz, DO. 06/12/2021     Patient ID: Candice Bennett, female    DOB: Jan 05, 1946, 75 y.o.   MRN: [id number]  Chief Complaint  Patient presents with   Hypertension   Hyperlipidemia   Follow-up    Hypertension Pertinent negatives include no blurred vision, chest pain, headaches, malaise/fatigue, palpitations or shortness of breath.  Hyperlipidemia Pertinent negatives include no chest pain or shortness of breath.  Patient is in today for a office visit.  She is following up for dizziness. She reports during her last visit she completed an echocardiogram and found the bottom of her heart was slightly enlarged due to hypertension.  Her blood pressure during this visit is doing well.  BP Readings from Last 3 Encounters:  06/12/21 122/78  02/07/21 136/79  11/22/20 128/70   Pulse Readings from Last 3 Encounters:  06/12/21 75  02/07/21 80  11/22/20 79   He last cholesterol levels were good. Lab Results  Component Value Date   CHOL 173 11/22/2020   HDL 59.10 11/22/2020   LDLCALC 98 11/22/2020   TRIG 81.0 11/22/2020   CHOLHDL 3 11/22/2020   She complains of a hoarse voice for the past month. Her symptoms started after she recovered from her cold 1 month ago.  She is due for tetanus vaccine and is interested in receiving it at her pharmacy.    Past Medical History:  Diagnosis Date   Follicular lymphoma (HCC)    OSA (obstructive sleep apnea)    Sarcoidosis    Unspecified essential hypertension     Past Surgical History:  Procedure Laterality Date   ABDOMINAL HYSTERECTOMY  1983   CARPAL TUNNEL RELEASE     LYMPH NODE BIOPSY     port a cath insertion  2005   PORT-A-CATH REMOVAL  2006   TUBAL LIGATION  1978    Family History  Problem Relation Age of Onset   Lung cancer Father    Heart attack Father     Diabetes Brother    Colon cancer Brother    Heart disease Mother    Heart disease Sister 37       chf   Dementia Sister    Kidney disease Sister    Hypertension Sister    Multiple myeloma Sister    Bell's palsy Sister    Heart attack Sister 67   Diabetes Sister     Social History   Socioeconomic History   Marital status: Divorced    Spouse name: Not on file   Number of children: 2   Years of education: Not on file   Highest education level: Not on file  Occupational History   Occupation: Therapist, 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[place]  Tobacco Use   Smoking status: Never   Smokeless tobacco: Never  Substance and Sexual Activity   Alcohol use: No   Drug use: No   Sexual activity: Not Currently    Partners: Male  Other Topics Concern   Not on file  Social History Narrative   No regularly exercise   Live with daughter and her daughter   Social Determinants of Health   Financial Resource Strain: Not on file  Food Insecurity: Not on file  Transportation Needs: Not on file  Physical Activity: Not on file  Stress: Not on  file  Social Connections: Not on file  Intimate Partner Violence: Not on file    Outpatient Medications Prior to Visit  Medication Sig Dispense Refill   amLODipine (NORVASC) 5 MG tablet Take 1 tablet by mouth once daily 90 tablet 1   clotrimazole-betamethasone (LOTRISONE) cream APPLY  CREAM TOPICALLY TWICE DAILY 45 g 0   labetalol (NORMODYNE) 100 MG tablet Take 1 tablet by mouth twice daily 180 tablet 0   mometasone (ELOCON) 0.1 % cream APPLY  CREAM TOPICALLY TO AFFECTED AREA(S) ONCE DAILY 45 g 1   amoxicillin-clavulanate (AUGMENTIN) 875-125 MG tablet Take 1 tablet by mouth 2 (two) times daily. 20 tablet 0   fluticasone (FLONASE) 50 MCG/ACT nasal spray Place 2 sprays into both nostrils daily. 16 g 1   nystatin (NYSTATIN) powder Apply topically 4 (four) times daily. 15 g 0   No facility-administered medications prior to visit.     Allergies  Allergen Reactions   Iohexol Itching   Ivp Dye [Iodinated Diagnostic Agents] Itching    Review of Systems  Constitutional:  Negative for fever and malaise/fatigue.  HENT:  Negative for congestion.        (+)hoarse voice  Eyes:  Negative for blurred vision.  Respiratory:  Negative for shortness of breath.   Cardiovascular:  Negative for chest pain, palpitations and leg swelling.  Gastrointestinal:  Negative for abdominal pain, blood in stool and nausea.  Genitourinary:  Negative for dysuria and frequency.  Musculoskeletal:  Negative for falls.  Skin:  Negative for rash.  Neurological:  Negative for dizziness, loss of consciousness and headaches.  Endo/Heme/Allergies:  Negative for environmental allergies.  Psychiatric/Behavioral:  Negative for depression. The patient is not nervous/anxious.       Objective:    Physical Exam Vitals and nursing note reviewed.  Constitutional:      Appearance: She is well-developed.  HENT:     Head: Normocephalic and atraumatic.  Eyes:     Conjunctiva/sclera: Conjunctivae normal.  Neck:     Thyroid: No thyromegaly.     Vascular: No carotid bruit or JVD.  Cardiovascular:     Rate and Rhythm: Normal rate and regular rhythm.     Heart sounds: Normal heart sounds. No murmur heard. Pulmonary:     Effort: Pulmonary effort is normal. No respiratory distress.     Breath sounds: Normal breath sounds. No wheezing or rales.  Chest:     Chest wall: No tenderness.  Musculoskeletal:     Cervical back: Normal range of motion and neck supple.  Neurological:     Mental Status: She is alert and oriented to person, place, and time.    BP 122/78 (BP Location: Left Arm, Patient Position: Sitting, Cuff Size: Normal)    Pulse 75    Temp 98.6 F (37 C) (Oral)    Resp 18    Ht _0  (1.6 m)    Wt 191 lb 6.4 oz (86.8 kg)    SpO2 99%    BMI 33.90 kg/m  Wt Readings from Last 3 Encounters:  06/12/21 191 lb 6.4 oz (86.8 kg)  02/07/21 192 lb (87.1  kg)  11/22/20 196 lb (88.9 kg)    Diabetic Foot Exam - Simple   No data filed    Lab Results  Component Value Date   WBC 6.4 02/07/2021   HGB 11.9 02/07/2021   HCT 38.5 02/07/2021   PLT 357 02/07/2021   GLUCOSE 92 02/07/2021   CHOL 173 11/22/2020   TRIG 81.0 11/22/2020  HDL 59.10 11/22/2020   LDLCALC 98 11/22/2020   ALT 15 02/07/2021   AST 17 02/07/2021   NA 139 02/07/2021   K 4.1 02/07/2021   CL 102 02/07/2021   CREATININE 0.74 02/07/2021   BUN 15 02/07/2021   CO2 29 02/07/2021   TSH 0.86 04/07/2011   MICROALBUR 1.5 08/27/2017    Lab Results  Component Value Date   TSH 0.86 04/07/2011   Lab Results  Component Value Date   WBC 6.4 02/07/2021   HGB 11.9 02/07/2021   HCT 38.5 02/07/2021   MCV 80.5 02/07/2021   PLT 357 02/07/2021   Lab Results  Component Value Date   NA 139 02/07/2021   K 4.1 02/07/2021   CO2 29 02/07/2021   GLUCOSE 92 02/07/2021   BUN 15 02/07/2021   CREATININE 0.74 02/07/2021   BILITOT 0.5 02/07/2021   ALKPHOS 77 11/22/2020   AST 17 02/07/2021   ALT 15 02/07/2021   PROT 7.8 02/07/2021   ALBUMIN 4.1 11/22/2020   CALCIUM 9.6 02/07/2021   GFR 75.43 11/22/2020   Lab Results  Component Value Date   CHOL 173 11/22/2020   Lab Results  Component Value Date   HDL 59.10 11/22/2020   Lab Results  Component Value Date   LDLCALC 98 11/22/2020   Lab Results  Component Value Date   TRIG 81.0 11/22/2020   Lab Results  Component Value Date   CHOLHDL 3 11/22/2020   No results found for: HGBA1C     Assessment & Plan:   Problem List Items Addressed This Visit       Unprioritized   Essential (primary) hypertension    Well controlled, no changes to meds. Encouraged heart healthy diet such as the DASH diet and exercise as tolerated.       Hyperlipidemia LDL goal <100    Encourage heart healthy diet such as MIND or DASH diet, increase exercise, avoid trans fats, simple carbohydrates and processed foods, consider a krill or fish or  flaxseed oil cap daily.       Other Visit Diagnoses     Primary hypertension    -  Primary   Relevant Orders   Comprehensive metabolic panel   Need for influenza vaccination       Relevant Orders   Flu Vaccine QUAD High Dose(Fluad)   Hyperlipidemia, unspecified hyperlipidemia type       Relevant Orders   Lipid panel        No orders of the defined types were placed in this encounter.   I, Dr. Roma Schanz, DO, personally preformed the services described in this documentation.  All medical record entries made by the scribe were at my direction and in my presence.  I have reviewed the chart and discharge instructions (if applicable) and agree that the record reflects my personal performance and is accurate and complete. 06/12/2021   I,Shehryar Baig,acting as a scribe for [name], DO.,have documented all relevant documentation on the behalf of [name], DO,as directed by  [name], DO while in the presence of [name], DO.   [name], DO

## 2021-06-12 NOTE — Patient Instructions (Signed)

## 2021-06-13 LAB — LIPID PANEL
Cholesterol: 180 mg/dL (ref 0–200)
HDL: 62.2 mg/dL (ref >39.00)
LDL Cholesterol: 92 mg/dL (ref 0–99)
NonHDL: 118.06
Total CHOL/HDL Ratio: 3
Triglycerides: 132 mg/dL (ref 0.0–149.0)
VLDL: 26.4 mg/dL (ref 0.0–40.0)

## 2021-06-13 LAB — COMPREHENSIVE METABOLIC PANEL
ALT: 11 U/L (ref 0–35)
AST: 14 U/L (ref 0–37)
Albumin: 3.9 g/dL (ref 3.5–5.2)
Alkaline Phosphatase: 65 U/L (ref 39–117)
BUN: 11 mg/dL (ref 6–23)
CO2: 31 mEq/L (ref 19–32)
Calcium: 9.3 mg/dL (ref 8.4–10.5)
Chloride: 104 mEq/L (ref 96–112)
Creatinine, Ser: 0.82 mg/dL (ref 0.40–1.20)
GFR: 70.76 mL/min (ref >60.00)
Glucose, Bld: 86 mg/dL (ref 70–99)
Potassium: 3.9 mEq/L (ref 3.5–5.1)
Sodium: 141 mEq/L (ref 135–145)
Total Bilirubin: 0.6 mg/dL (ref 0.2–1.2)
Total Protein: 7.1 g/dL (ref 6.0–8.3)

## 2021-06-13 NOTE — Assessment & Plan Note (Signed)
Encourage heart healthy diet such as MIND or DASH diet, increase exercise, avoid trans fats, simple carbohydrates and processed foods, consider a krill or fish or flaxseed oil cap daily.  °

## 2021-06-13 NOTE — Assessment & Plan Note (Signed)
Well controlled, no changes to meds. Encouraged heart healthy diet such as the DASH diet and exercise as tolerated.  °

## 2021-06-20 ENCOUNTER — Other Ambulatory Visit: Payer: Self-pay | Admitting: Family Medicine

## 2021-06-20 DIAGNOSIS — Z1231 Encounter for screening mammogram for malignant neoplasm of breast: Secondary | ICD-10-CM

## 2021-06-24 ENCOUNTER — Ambulatory Visit: Payer: Medicare Other | Admitting: Family Medicine

## 2021-07-09 ENCOUNTER — Ambulatory Visit
Admission: RE | Admit: 2021-07-09 | Discharge: 2021-07-09 | Disposition: A | Discharge status: Home / Self Care | Payer: Medicare Other | Source: Ambulatory Visit | Attending: Family Medicine | Admitting: Family Medicine

## 2021-07-09 ENCOUNTER — Other Ambulatory Visit: Payer: Self-pay

## 2021-07-09 DIAGNOSIS — Z1231 Encounter for screening mammogram for malignant neoplasm of breast: Secondary | ICD-10-CM | POA: Diagnosis not present

## 2021-08-19 ENCOUNTER — Other Ambulatory Visit: Payer: Self-pay

## 2021-08-19 ENCOUNTER — Ambulatory Visit (INDEPENDENT_AMBULATORY_CARE_PROVIDER_SITE_OTHER): Payer: Medicare Other

## 2021-08-19 DIAGNOSIS — Z1211 Encounter for screening for malignant neoplasm of colon: Secondary | ICD-10-CM

## 2021-08-19 DIAGNOSIS — Z800 Family history of malignant neoplasm of digestive organs: Secondary | ICD-10-CM | POA: Diagnosis not present

## 2021-08-19 DIAGNOSIS — Z0000 Encounter for general adult medical examination without abnormal findings: Secondary | ICD-10-CM

## 2021-08-19 NOTE — Patient Instructions (Addendum)
Candice Bennett , Thank you for taking time to come for your Medicare Wellness Visit. I appreciate your ongoing commitment to your health goals. Please review the following plan we discussed and let me know if I can assist you in the future.   Screening recommendations/referrals: Colonoscopy: order placed 08/19/21 Mammogram: Done 07/09/21 Bone Density: Done 06/09/19 repeat every 2 years  Recommended yearly ophthalmology/optometry visit for glaucoma screening and checkup Recommended yearly dental visit for hygiene and checkup  Vaccinations: Influenza vaccine: Done 06/12/21 repeat every year  Pneumococcal vaccine: Up to date Tdap vaccine: Due and discussed  Shingles vaccine: Shingrix discussed. Please contact your pharmacy for coverage information.    Covid-19:Completed 6/9, 12/24/19  Advanced directives: Copies in chart  Conditions/risks identified: lose weight increase water and walking   Next appointment: Follow up in one year for your annual wellness visit    Preventive Care 65 Years and Older, Female Preventive care refers to lifestyle choices and visits with your health care provider that can promote health and wellness. What does preventive care include? A yearly physical exam. This is also called an annual well check. Dental exams once or twice a year. Routine eye exams. Ask your health care provider how often you should have your eyes checked. Personal lifestyle choices, including: Daily care of your teeth and gums. Regular physical activity. Eating a healthy diet. Avoiding tobacco and drug use. Limiting alcohol use. Practicing safe sex. Taking low-dose aspirin every day. Taking vitamin and mineral supplements as recommended by your health care provider. What happens during an annual well check? The services and screenings done by your health care provider during your annual well check will depend on your age, overall health, lifestyle risk factors, and family history of  disease. Counseling  Your health care provider may ask you questions about your: Alcohol use. Tobacco use. Drug use. Emotional well-being. Home and relationship well-being. Sexual activity. Eating habits. History of falls. Memory and ability to understand (cognition). Work and work Statistician. Reproductive health. Screening  You may have the following tests or measurements: Height, weight, and BMI. Blood pressure. Lipid and cholesterol levels. These may be checked every 5 years, or more frequently if you are over 74 years old. Skin check. Lung cancer screening. You may have this screening every year starting at age 74 if you have a 30-pack-year history of smoking and currently smoke or have quit within the past 15 years. Fecal occult blood test (FOBT) of the stool. You may have this test every year starting at age 74. Flexible sigmoidoscopy or colonoscopy. You may have a sigmoidoscopy every 5 years or a colonoscopy every 10 years starting at age 74. Hepatitis C blood test. Hepatitis B blood test. Sexually transmitted disease (STD) testing. Diabetes screening. This is done by checking your blood sugar (glucose) after you have not eaten for a while (fasting). You may have this done every 1-3 years. Bone density scan. This is done to screen for osteoporosis. You may have this done starting at age 74. Mammogram. This may be done every 1-2 years. Talk to your health care provider about how often you should have regular mammograms. Talk with your health care provider about your test results, treatment options, and if necessary, the need for more tests. Vaccines  Your health care provider may recommend certain vaccines, such as: Influenza vaccine. This is recommended every year. Tetanus, diphtheria, and acellular pertussis (Tdap, Td) vaccine. You may need a Td booster every 10 years. Zoster vaccine. You may need this  after age 74. Pneumococcal 13-valent conjugate (PCV13) vaccine. One  dose is recommended after age 74. Pneumococcal polysaccharide (PPSV23) vaccine. One dose is recommended after age 74. Talk to your health care provider about which screenings and vaccines you need and how often you need them. This information is not intended to replace advice given to you by your health care provider. Make sure you discuss any questions you have with your health care provider. Document Released: 07/05/2015 Document Revised: 02/26/2016 Document Reviewed: 04/09/2015 Elsevier Interactive Patient Education  2017 [place] Prevention in the Home Falls can cause injuries. They can happen to people of all ages. There are many things you can do to make your home safe and to help prevent falls. What can I do on the outside of my home? Regularly fix the edges of walkways and driveways and fix any cracks. Remove anything that might make you trip as you walk through a door, such as a raised step or threshold. Trim any bushes or trees on the path to your home. Use bright outdoor lighting. Clear any walking paths of anything that might make someone trip, such as rocks or tools. Regularly check to see if handrails are loose or broken. Make sure that both sides of any steps have handrails. Any raised decks and porches should have guardrails on the edges. Have any leaves, snow, or ice cleared regularly. Use sand or salt on walking paths during winter. Clean up any spills in your garage right away. This includes oil or grease spills. What can I do in the bathroom? Use night lights. Install grab bars by the toilet and in the tub and shower. Do not use towel bars as grab bars. Use non-skid mats or decals in the tub or shower. If you need to sit down in the shower, use a plastic, non-slip stool. Keep the floor dry. Clean up any water that spills on the floor as soon as it happens. Remove soap buildup in the tub or shower regularly. Attach bath mats securely with double-sided  non-slip rug tape. Do not have throw rugs and other things on the floor that can make you trip. What can I do in the bedroom? Use night lights. Make sure that you have a light by your bed that is easy to reach. Do not use any sheets or blankets that are too big for your bed. They should not hang down onto the floor. Have a firm chair that has side arms. You can use this for support while you get dressed. Do not have throw rugs and other things on the floor that can make you trip. What can I do in the kitchen? Clean up any spills right away. Avoid walking on wet floors. Keep items that you use a lot in easy-to-reach places. If you need to reach something above you, use a strong step stool that has a grab bar. Keep electrical cords out of the way. Do not use floor polish or wax that makes floors slippery. If you must use wax, use non-skid floor wax. Do not have throw rugs and other things on the floor that can make you trip. What can I do with my stairs? Do not leave any items on the stairs. Make sure that there are handrails on both sides of the stairs and use them. Fix handrails that are broken or loose. Make sure that handrails are as long as the stairways. Check any carpeting to make sure that it is firmly attached to the  stairs. Fix any carpet that is loose or worn. Avoid having throw rugs at the top or bottom of the stairs. If you do have throw rugs, attach them to the floor with carpet tape. Make sure that you have a light switch at the top of the stairs and the bottom of the stairs. If you do not have them, ask someone to add them for you. What else can I do to help prevent falls? Wear shoes that: Do not have high heels. Have rubber bottoms. Are comfortable and fit you well. Are closed at the toe. Do not wear sandals. If you use a stepladder: Make sure that it is fully opened. Do not climb a closed stepladder. Make sure that both sides of the stepladder are locked into place. Ask  someone to hold it for you, if possible. Clearly mark and make sure that you can see: Any grab bars or handrails. First and last steps. Where the edge of each step is. Use tools that help you move around (mobility aids) if they are needed. These include: Canes. Walkers. Scooters. Crutches. Turn on the lights when you go into a dark area. Replace any light bulbs as soon as they burn out. Set up your furniture so you have a clear path. Avoid moving your furniture around. If any of your floors are uneven, fix them. If there are any pets around you, be aware of where they are. Review your medicines with your doctor. Some medicines can make you feel dizzy. This can increase your chance of falling. Ask your doctor what other things that you can do to help prevent falls. This information is not intended to replace advice given to you by your health care provider. Make sure you discuss any questions you have with your health care provider. Document Released: 04/04/2009 Document Revised: 11/14/2015 Document Reviewed: 07/13/2014 Elsevier Interactive Patient Education  2017 [employer].

## 2021-08-19 NOTE — Progress Notes (Addendum)
Virtual Visit via Telephone Note  I connected with  Candice Bennett on 08/19/21 at  3:15 PM EST by telephone and verified that I am speaking with the correct person using two identifiers.  Medicare Annual Wellness visit completed telephonically due to Covid-19 pandemic.   Persons participating in this call: This Health Coach and this patient.   Location: Patient: home Provider: office    I discussed the limitations, risks, security and privacy concerns of performing an evaluation and management service by telephone and the availability of in person appointments. The patient expressed understanding and agreed to proceed.  Unable to perform video visit due to video visit attempted and failed and/or patient does not have video capability.   Some vital signs may be absent or patient reported.   Willette Brace, LPN   Subjective:   Candice Bennett is a 76 y.o. female who presents for Medicare Annual (Subsequent) preventive examination.  Review of Systems     Cardiac Risk Factors include: advanced age (>21mn, >>71women);hypertension;dyslipidemia;obesity (BMI >30kg/m2)     Objective:    There were no vitals filed for this visit. There is no height or weight on file to calculate BMI.  Advanced Directives 08/19/2021 05/22/2019 05/16/2018 05/10/2017 08/18/2016 05/08/2016  Does Patient Have a Medical Advance Directive? Yes Yes Yes Yes Yes Yes  Type of AIndustrial/product designerof ACadd[place]Living will [place] GrantLiving will [place]Living will [place]Living will  Does patient want to make changes to medical advance directive? - No - Patient declined - - No - Patient declined -  Copy of HJenkinsin Chart? Yes - validated most recent copy scanned in chart (See row information) Yes - validated most recent copy scanned in chart (See row  information) Yes - validated most recent copy scanned in chart (See row information) Yes No - copy requested No - copy requested    Current Medications (verified) Outpatient Encounter Medications as of 08/19/2021  Medication Sig   amLODipine (NORVASC) 5 MG tablet Take 1 tablet by mouth once daily   clotrimazole-betamethasone (LOTRISONE) cream APPLY  CREAM TOPICALLY TWICE DAILY   labetalol (NORMODYNE) 100 MG tablet Take 1 tablet by mouth twice daily   mometasone (ELOCON) 0.1 % cream APPLY  CREAM TOPICALLY TO AFFECTED AREA(S) ONCE DAILY   No facility-administered encounter medications on file as of 08/19/2021.    Allergies (verified) Iohexol and Ivp dye [iodinated contrast media]   History: Past Medical History:  Diagnosis Date   Follicular lymphoma (HCC)    OSA (obstructive sleep apnea)    Sarcoidosis    Unspecified essential hypertension    Past Surgical History:  Procedure Laterality Date   ABDOMINAL HYSTERECTOMY  1983   CARPAL TUNNEL RELEASE     LYMPH NODE BIOPSY     port a cath insertion  2005   PORT-A-CATH REMOVAL  2006   TUBAL LIGATION  1978   Family History  Problem Relation Age of Onset   Lung cancer Father    Heart attack Father    Diabetes Brother    Colon cancer Brother    Heart disease Mother    Heart disease Sister [phone]      chf   Dementia Sister    Kidney disease Sister    Hypertension Sister    Multiple myeloma Sister    Bell's palsy Sister    Heart attack Sister [phone]  Diabetes Sister    Social History   Socioeconomic History   Marital status: Divorced    Spouse name: Not on file   Number of children: 2   Years of education: Not on file   Highest education level: Not on file  Occupational History   Occupation: Therapist, 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[employer]  Tobacco Use   Smoking status: Never   Smokeless tobacco: Never  Substance and Sexual Activity   Alcohol use: No   Drug use: No   Sexual activity: Not Currently     Partners: Male  Other Topics Concern   Not on file  Social History Narrative   No regularly exercise   Live with daughter and her daughter   Social Determinants of Health   Financial Resource Strain: Low Risk    Difficulty of Paying Living Expenses: Not hard at all  Food Insecurity: No Food Insecurity   Worried About Charity fundraiser in the Last Year: Never true   Arboriculturist in the Last Year: Never true  Transportation Needs: No Transportation Needs   Lack of Transportation (Medical): No   Lack of Transportation (Non-Medical): No  Physical Activity: Inactive   Days of Exercise per Week: 0 days   Minutes of Exercise per Session: 0 min  Stress: Stress Concern Present   Feeling of Stress : To some extent  Social Connections: Moderately Integrated   Frequency of Communication with Friends and Family: Three times a week   Frequency of Social Gatherings with Friends and Family: Once a week   Attends Religious Services: More than 4 times per year   Active Member of [employer] or Organizations: Yes   Attends Archivist Meetings: 1 to 4 times per year   Marital Status: Widowed    Tobacco Counseling Counseling given: Not Answered   Clinical Intake:  Pre-visit preparation completed: Yes  Pain : No/denies pain     BMI - recorded: 33.91 Nutritional Status: BMI > 30  Obese Nutritional Risks: None Diabetes: No  How often do you need to have someone help you when you read instructions, pamphlets, or other written materials from your doctor or pharmacy?: 1 - Never  Diabetic?no  Interpreter Needed?: No  Information entered by :: Charlott Rakes, LPN   Activities of Daily Living In your present state of health, do you have any difficulty performing the following activities: 08/19/2021 11/22/2020  Hearing? Y N  Vision? N N  Difficulty concentrating or making decisions? N N  Walking or climbing stairs? N N  Dressing or bathing? N N  Doing errands, shopping? N N   Preparing Food and eating ? N -  Using the Toilet? N -  In the past six months, have you accidently leaked urine? N -  Do you have problems with loss of bowel control? N -  Managing your Medications? N -  Managing your Finances? N -  Housekeeping or managing your Housekeeping? N -  Some recent data might be hidden    Patient Care Team: Carollee Herter, Alferd Apa, DO as PCP - General Inda Castle, MD (Inactive) as Consulting Physician (Gastroenterology)  Indicate any recent Medical Services you may have received from other than [facility] providers in the past year (date may be approximate).     Assessment:   This is a routine wellness examination for Renly.  Hearing/Vision screen Hearing Screening - Comments:: Pt stated hard time with surrounding noises  Vision Screening - Comments:: Pt follows up with Dr Bing Plume for eye exams   Dietary issues and exercise activities discussed: Current Exercise Habits: The patient does not participate in regular exercise at present   Goals Addressed             This Visit's Progress    Patient Stated       Lose weight,  increase walking and water        Depression Screen PHQ 2/9 Scores 08/19/2021 11/22/2020 05/22/2019 05/16/2018 05/16/2018 05/10/2017 05/08/2016  PHQ - 2 Score 0 0 0 0 0 0 0  PHQ- 9 Score - - - - 5 - -    Fall Risk Fall Risk  08/19/2021 11/22/2020 05/22/2019 05/16/2018 05/10/2017  Falls in the past year? 0 0 0 0 No  Number falls in past yr: 0 0 0 - -  Injury with Fall? 0 0 0 - -  Risk for fall due to : Impaired vision;Impaired balance/gait - - - -  Risk for fall due to: Comment - - - - -  Follow up Falls prevention discussed Falls evaluation completed Education provided;Falls prevention discussed - -    FALL RISK PREVENTION PERTAINING TO THE HOME:  Any stairs in or around the home? Yes  If so, are there any without handrails? No  Home free of loose throw rugs in walkways, pet beds, electrical cords, etc? Yes  Adequate  lighting in your home to reduce risk of falls? Yes   ASSISTIVE DEVICES UTILIZED TO PREVENT FALLS:  Life alert? No  Use of a cane, walker or w/c? No  Grab bars in the bathroom? No  Shower chair or bench in shower? No  Elevated toilet seat or a handicapped toilet? No   TIMED UP AND GO:  Was the test performed? No .   Cognitive Function: MMSE - Mini Mental State Exam 05/10/2017 05/08/2016  Orientation to time 5 5  Orientation to Place 5 5  Registration 3 3  Attention/ Calculation 5 5  Recall 3 3  Language- name 2 objects 2 2  Language- repeat 1 1  Language- follow 3 step command 3 3  Language- read & follow direction 1 1  Write a sentence 1 1  Copy design 1 1  Total score 30 30     6CIT Screen 08/19/2021  What Year? 0 points  What month? 0 points  What time? 0 points  Count back from 20 0 points  Months in reverse 0 points  Repeat phrase 0 points  Total Score 0    Immunizations Immunization History  Administered Date(s) Administered   Fluad Quad(high Dose 65+) 03/09/2019, 05/14/2020, 06/12/2021   Hepatitis B, adult 05/21/2020, 06/20/2020   Influenza Whole 03/26/2009   Influenza, High Dose Seasonal PF 02/25/2017, 05/16/2018   PFIZER(Purple Top)SARS-COV-2 Vaccination 11/29/2019, 12/24/2019   PPD Test 05/08/2020   Pneumococcal Conjugate-13 01/30/2014   Pneumococcal Polysaccharide-23 12/09/2012   Td 08/10/2000   Tdap 04/07/2011   Zoster, Live 02/10/2010    TDAP status: Due, Education has been provided regarding the importance of this vaccine. Advised may receive this vaccine at local pharmacy or Health Dept. Aware to provide a copy of the vaccination record if obtained from local pharmacy or Health Dept. Verbalized acceptance and understanding.  Flu Vaccine status: Up to date  Pneumococcal vaccine status: Up to date  Covid-19 vaccine status: Completed vaccines  Qualifies for Shingles Vaccine? Yes   Zostavax completed No   Shingrix Completed?: No.    Education  has been provided regarding the importance of this vaccine. Patient has been advised to call insurance company to determine out of pocket expense if they have not yet received this vaccine. Advised may also receive vaccine at local pharmacy or Health Dept. Verbalized acceptance and understanding.  Screening Tests Health Maintenance  Topic Date Due   Zoster Vaccines- Shingrix (1 of 2) Never done   COVID-19 Vaccine (3 - Pfizer risk series) 01/21/2020   TETANUS/TDAP  04/06/2021   COLONOSCOPY (Pts 45-20yr Insurance coverage will need to be confirmed)  08/19/2021   MAMMOGRAM  07/09/2022   Pneumonia Vaccine 76 Years old  Completed   INFLUENZA VACCINE  Completed   DEXA SCAN  Completed   Hepatitis C Screening  Completed   HPV VACCINES  Aged Out    Health Maintenance  Health Maintenance Due  Topic Date Due   Zoster Vaccines- Shingrix (1 of 2) Never done   COVID-19 Vaccine (3 - Pfizer risk series) 01/21/2020   TETANUS/TDAP  04/06/2021   COLONOSCOPY (Pts 45-44yrInsurance coverage will need to be confirmed)  08/19/2021    Colorectal cancer screening: Referral to GI placed 08/19/21. Pt aware the office will call re: appt.  Mammogram status: Completed 07/09/21. Repeat every year  Bone Density status: Completed 06/09/19. Results reflect: Bone density results: OSTEOPENIA. Repeat every 2 years.    Additional Screening:  Hepatitis C Screening:  Completed 02/01/15  Vision Screening: Recommended annual ophthalmology exams for early detection of glaucoma and other disorders of the eye. Is the patient up to date with their annual eye exam?  No  Who is the provider or what is the name of the office in which the patient attends annual eye exams? DiBing Plumef pt is not established with a provider, would they like to be referred to a provider to establish care? No .   Dental Screening: Recommended annual dental exams for proper oral hygiene  Community Resource Referral / Chronic Care  Management: CRR required this visit?  No   CCM required this visit?  No      Plan:     I have personally reviewed and noted the following in the patients chart:   Medical and social history Use of alcohol, tobacco or illicit drugs  Current medications and supplements including opioid prescriptions.  Functional ability and status Nutritional status Physical activity Advanced directives List of other physicians Hospitalizations, surgeries, and ER visits in previous 12 months Vitals Screenings to include cognitive, depression, and falls Referrals and appointments  In addition, I have reviewed and discussed with patient certain preventive protocols, quality metrics, and best practice recommendations. A written personalized care plan for preventive services as well as general preventive health recommendations were provided to patient.     TiWillette BraceLPN   07/26/64/5993 Nurse Notes: None

## 2021-09-01 NOTE — Progress Notes (Signed)
This encounter was created in error - please disregard.

## 2021-09-07 ENCOUNTER — Other Ambulatory Visit: Payer: Self-pay | Admitting: Family Medicine

## 2021-09-07 DIAGNOSIS — I10 Essential (primary) hypertension: Secondary | ICD-10-CM

## 2021-09-09 ENCOUNTER — Encounter: Payer: Self-pay | Admitting: Gastroenterology

## 2021-09-25 ENCOUNTER — Encounter: Payer: Self-pay | Admitting: Gastroenterology

## 2021-10-09 ENCOUNTER — Encounter: Payer: Medicare Other | Admitting: Gastroenterology

## 2021-11-20 ENCOUNTER — Other Ambulatory Visit: Payer: Self-pay | Admitting: Family Medicine

## 2021-11-20 DIAGNOSIS — I10 Essential (primary) hypertension: Secondary | ICD-10-CM

## 2021-11-28 ENCOUNTER — Ambulatory Visit (AMBULATORY_SURGERY_CENTER): Payer: Medicare Other | Admitting: *Deleted

## 2021-11-28 VITALS — Ht 62.0 in | Wt 205.0 lb

## 2021-11-28 DIAGNOSIS — Z1211 Encounter for screening for malignant neoplasm of colon: Secondary | ICD-10-CM

## 2021-11-28 NOTE — Progress Notes (Signed)
No egg or soy allergy known to patient  No issues known to pt with past sedation with any surgeries or procedures Patient denies ever being told they had issues or difficulty with intubation  No FH of Malignant Hyperthermia Pt is not on diet pills Pt is not on  home 02  Pt is not on blood thinners  Pt denies issues with constipation  No A fib or A flutter   PV completed over the phone. Pt verified name, DOB, address and insurance during PV today.    Pt encouraged to call with questions or issues.  If pt has My chart, procedure instructions sent via My Chart  Insurance confirmed with pt at [facility] today

## 2021-12-03 ENCOUNTER — Telehealth: Payer: Self-pay | Admitting: Gastroenterology

## 2021-12-03 NOTE — Telephone Encounter (Signed)
Inbound call from patient stating that for the last few days she has had a sore throat and has been having difficulty swallowing. Patient is requesting a call back to discuss if she will be able to have her procedure on  6/22. Please advise.

## 2021-12-03 NOTE — Telephone Encounter (Signed)
LMOM to call office back.

## 2021-12-03 NOTE — Telephone Encounter (Signed)
Spoke with pt and colonoscopy rescheduled to 12-30-21 at 11:00 AM.  New prep instructions sent via MyChart

## 2021-12-08 ENCOUNTER — Ambulatory Visit (INDEPENDENT_AMBULATORY_CARE_PROVIDER_SITE_OTHER): Payer: Medicare Other | Admitting: Family Medicine

## 2021-12-08 ENCOUNTER — Encounter: Payer: Self-pay | Admitting: Family Medicine

## 2021-12-08 ENCOUNTER — Ambulatory Visit (HOSPITAL_BASED_OUTPATIENT_CLINIC_OR_DEPARTMENT_OTHER)
Admission: RE | Admit: 2021-12-08 | Discharge: 2021-12-08 | Disposition: A | Discharge status: Home / Self Care | Payer: Medicare Other | Source: Ambulatory Visit | Attending: Family Medicine | Admitting: Family Medicine

## 2021-12-08 VITALS — BP 118/78 | HR 85 | Temp 98.7°F | Resp 18 | Ht 62.0 in | Wt 198.6 lb

## 2021-12-08 DIAGNOSIS — G4733 Obstructive sleep apnea (adult) (pediatric): Secondary | ICD-10-CM | POA: Diagnosis not present

## 2021-12-08 DIAGNOSIS — R5383 Other fatigue: Secondary | ICD-10-CM

## 2021-12-08 DIAGNOSIS — R591 Generalized enlarged lymph nodes: Secondary | ICD-10-CM | POA: Diagnosis not present

## 2021-12-08 DIAGNOSIS — E785 Hyperlipidemia, unspecified: Secondary | ICD-10-CM | POA: Diagnosis not present

## 2021-12-08 DIAGNOSIS — J029 Acute pharyngitis, unspecified: Secondary | ICD-10-CM | POA: Diagnosis not present

## 2021-12-08 DIAGNOSIS — Z8572 Personal history of non-Hodgkin lymphomas: Secondary | ICD-10-CM

## 2021-12-08 LAB — COMPREHENSIVE METABOLIC PANEL
ALT: 13 U/L (ref 0–35)
AST: 14 U/L (ref 0–37)
Albumin: 4.3 g/dL (ref 3.5–5.2)
Alkaline Phosphatase: 74 U/L (ref 39–117)
BUN: 11 mg/dL (ref 6–23)
CO2: 29 mEq/L (ref 19–32)
Calcium: 9.6 mg/dL (ref 8.4–10.5)
Chloride: 100 mEq/L (ref 96–112)
Creatinine, Ser: 0.94 mg/dL (ref 0.40–1.20)
GFR: 59.86 mL/min — ABNORMAL LOW (ref >60.00)
Glucose, Bld: 88 mg/dL (ref 70–99)
Potassium: 4.7 mEq/L (ref 3.5–5.1)
Sodium: 135 mEq/L (ref 135–145)
Total Bilirubin: 0.7 mg/dL (ref 0.2–1.2)
Total Protein: 7.8 g/dL (ref 6.0–8.3)

## 2021-12-08 LAB — LIPID PANEL
Cholesterol: 177 mg/dL (ref 0–200)
HDL: 63.5 mg/dL (ref >39.00)
LDL Cholesterol: 101 mg/dL — ABNORMAL HIGH (ref 0–99)
NonHDL: 113.76
Total CHOL/HDL Ratio: 3
Triglycerides: 66 mg/dL (ref 0.0–149.0)
VLDL: 13.2 mg/dL (ref 0.0–40.0)

## 2021-12-08 LAB — CBC WITH DIFFERENTIAL/PLATELET
Basophils Absolute: 0.1 10*3/uL (ref 0.0–0.1)
Basophils Relative: 0.8 % (ref 0.0–3.0)
Eosinophils Absolute: 0.1 10*3/uL (ref 0.0–0.7)
Eosinophils Relative: 0.8 % (ref 0.0–5.0)
HCT: 37.8 % (ref 36.0–46.0)
Hemoglobin: 12.2 g/dL (ref 12.0–15.0)
Lymphocytes Relative: 19 % (ref 12.0–46.0)
Lymphs Abs: 2.1 10*3/uL (ref 0.7–4.0)
MCHC: 32.3 g/dL (ref 30.0–36.0)
MCV: 79.8 fl (ref 78.0–100.0)
Monocytes Absolute: 0.7 10*3/uL (ref 0.1–1.0)
Monocytes Relative: 6.2 % (ref 3.0–12.0)
Neutro Abs: 8 10*3/uL — ABNORMAL HIGH (ref 1.4–7.7)
Neutrophils Relative %: 73.2 % (ref 43.0–77.0)
Platelets: 412 10*3/uL — ABNORMAL HIGH (ref 150.0–400.0)
RBC: 4.73 Mil/uL (ref 3.87–5.11)
RDW: 15 % (ref 11.5–15.5)
WBC: 10.9 10*3/uL — ABNORMAL HIGH (ref 4.0–10.5)

## 2021-12-08 LAB — MONONUCLEOSIS SCREEN: Mono Screen: NEGATIVE

## 2021-12-08 MED ORDER — AMOXICILLIN-POT CLAVULANATE 875-125 MG PO TABS: 1.0000 | ORAL_TABLET | Freq: Two times a day (BID) | ORAL | 0 refills | Status: DC

## 2021-12-08 NOTE — Assessment & Plan Note (Signed)
abx per orders  flonase and antihistmaine  rto prn

## 2021-12-08 NOTE — Patient Instructions (Signed)

## 2021-12-08 NOTE — Progress Notes (Addendum)
Subjective:   By signing my name below, I, Carylon Perches, attest that this documentation has been prepared under the direction and in the presence of [name] DO 12/08/2021     Patient ID: Candice Bennett, female    DOB: [birth year], 76 y.o.   MRN: [id number]  Chief Complaint  Patient presents with   Sore Throat    HPI Patient is in today for an office visit.  She complains of bilateral knee stiffness. She previously had pain in both of her knees. She believes it was due to her occupation. 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 However, her stiffness is persistent.  She also complains of a sore throat. With associated symptoms of fatigue , mild head congestion, body aches and swollen glands. She noticed her sore throat on 11/29/2021. She also mentioned that she previously had a cough about a week - 10 days ago. Her gland tenderness appeared about 4-5 days ago. She has used home remedies, Tylenol, and Advil. She denies of any fever. She is concerned about lymphoma. She reports that when she was diagnosed with lymphoma, her sore throat would appear after chemotherapy.   She is concerned about potential sleep apnea. She reports that she would tend to fall asleep during her breaks at work. She did two sleep studies but could not handle the sleep machine. She is interested in using [employer] instead.   She Complains of right shoulder pain. Pain worsens when she is reaching for items. Pain is not consistent.   She is regularly measuring her blood pressure. She  states that her blood pressure fluctuates. As of today's visit, her blood pressure is normal. BP Readings from Last 3 Encounters:  12/08/21 118/78  06/12/21 122/78  02/07/21 136/79   Pulse Readings from Last 3 Encounters:  12/08/21 85  06/12/21 75  02/07/21 80    Past Medical History:  Diagnosis Date   Follicular lymphoma (HCC)    OSA (obstructive sleep  apnea)    Sarcoidosis    Unspecified essential hypertension     Past Surgical History:  Procedure Laterality Date   ABDOMINAL HYSTERECTOMY  1983   CARPAL TUNNEL RELEASE     LYMPH NODE BIOPSY     port a cath insertion  2005   PORT-A-CATH REMOVAL  2006   TUBAL LIGATION  1978    Family History  Problem Relation Age of Onset   Heart disease Mother    Lung cancer Father    Heart attack Father    Heart disease Sister 82       chf   Dementia Sister    Kidney disease Sister    Hypertension Sister    Multiple myeloma Sister    Bell's palsy Sister    Heart attack Sister 31   Diabetes Sister    Prostate cancer Brother    Diabetes Brother    Colon cancer Neg Hx    Colon polyps Neg Hx    Esophageal cancer Neg Hx    Stomach cancer Neg Hx    Rectal cancer Neg Hx     Social History   Socioeconomic History   Marital status: Divorced    Spouse name: Not on file   Number of children: 2   Years of education: Not on file   Highest education level: Not on file  Occupational History   Occupation: Therapist, 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  Employer: [employer]  Tobacco Use   Smoking status: Never   Smokeless tobacco: Never  Vaping Use   Vaping Use: Never used  Substance and Sexual Activity   Alcohol use: No   Drug use: No   Sexual activity: Not Currently    Partners: Male  Other Topics Concern   Not on file  Social History Narrative   No regularly exercise   Live with daughter and her daughter   Social Determinants of Health   Financial Resource Strain: Low Risk  (08/19/2021)   Overall Financial Resource Strain (CARDIA)    Difficulty of Paying Living Expenses: Not hard at all  Food Insecurity: No Food Insecurity (08/19/2021)   Hunger Vital Sign    Worried About Running Out of Food in the Last Year: Never true    Ran Out of Food in the Last Year: Never true  Transportation Needs: No Transportation Needs (08/19/2021)   PRAPARE - Hydrologist  (Medical): No    Lack of Transportation (Non-Medical): No  Physical Activity: Inactive (08/19/2021)   Exercise Vital Sign    Days of Exercise per Week: 0 days    Minutes of Exercise per Session: 0 min  Stress: Stress Concern Present (08/19/2021)   [place]    Feeling of Stress : To some extent  Social Connections: Moderately Integrated (08/19/2021)   Social Connection and Isolation Panel [NHANES]    Frequency of Communication with Friends and Family: Three times a week    Frequency of Social Gatherings with Friends and Family: Once a week    Attends Religious Services: More than 4 times per year    Active Member of [employer] or Organizations: Yes    Attends Archivist Meetings: 1 to 4 times per year    Marital Status: Widowed  Intimate Partner Violence: Not At Risk (08/19/2021)   Humiliation, Afraid, Rape, and Kick questionnaire    Fear of Current or Ex-Partner: No    Emotionally Abused: No    Physically Abused: No    Sexually Abused: No    Outpatient Medications Prior to Visit  Medication Sig Dispense Refill   amLODipine (NORVASC) 5 MG tablet Take 1 tablet by mouth once daily 90 tablet 0   aspirin EC 81 MG tablet Take 81 mg by mouth daily. Swallow whole.     clotrimazole-betamethasone (LOTRISONE) cream APPLY  CREAM TOPICALLY TWICE DAILY 45 g 0   labetalol (NORMODYNE) 100 MG tablet Take 1 tablet by mouth twice daily 180 tablet 0   mometasone (ELOCON) 0.1 % cream APPLY  CREAM TOPICALLY TO AFFECTED AREA(S) ONCE DAILY 45 g 1   No facility-administered medications prior to visit.    Allergies  Allergen Reactions   Iohexol Itching   Ivp Dye [Iodinated Contrast Media] Itching    Review of Systems  Constitutional:  Positive for malaise/fatigue. Negative for fever.       (+) Body Aches  HENT:  Positive for congestion and sore throat.        (+) Gland Tenderness  Eyes:  Negative for blurred vision.   Respiratory:  Negative for shortness of breath.   Cardiovascular:  Negative for chest pain, palpitations and leg swelling.  Gastrointestinal:  Negative for abdominal pain, blood in stool and nausea.  Genitourinary:  Negative for dysuria and frequency.  Musculoskeletal:  Positive for joint pain (Right Shoulder). Negative for falls.       (+) Knee Stiffness  Skin:  Negative for rash.  Neurological:  Negative for dizziness, loss of consciousness and headaches.  Endo/Heme/Allergies:  Negative for environmental allergies.  Psychiatric/Behavioral:  Negative for depression. The patient is not nervous/anxious.        Objective:    Physical Exam Vitals and nursing note reviewed.  Constitutional:      General: She is not in acute distress.    Appearance: Normal appearance. She is not ill-appearing.  HENT:     Head: Normocephalic and atraumatic.     Right Ear: External ear normal.     Left Ear: External ear normal.     Mouth/Throat:     Comments: (+) Swollen Glands (+) Reddening of Glands Eyes:     Extraocular Movements: Extraocular movements intact.     Pupils: Pupils are equal, round, and reactive to light.  Cardiovascular:     Rate and Rhythm: Normal rate and regular rhythm.     Heart sounds: Normal heart sounds. No murmur heard.    No gallop.  Pulmonary:     Effort: Pulmonary effort is normal. No respiratory distress.     Breath sounds: Normal breath sounds. No wheezing or rales.  Skin:    General: Skin is warm and dry.  Neurological:     Mental Status: She is alert and oriented to person, place, and time.  Psychiatric:        Judgment: Judgment normal.     BP 118/78 (BP Location: Left Arm, Patient Position: Sitting, Cuff Size: Large)   Pulse 85   Temp 98.7 F (37.1 C) (Oral)   Resp 18   Ht 5' 2" (1.575 m)   Wt 198 lb 9.6 oz (90.1 kg)   SpO2 97%   BMI 36.32 kg/m  Wt Readings from Last 3 Encounters:  12/08/21 198 lb 9.6 oz (90.1 kg)  11/28/21 205 lb (93 kg)   06/12/21 191 lb 6.4 oz (86.8 kg)    Diabetic Foot Exam - Simple   No data filed    Lab Results  Component Value Date   WBC 10.9 (H) 12/08/2021   HGB 12.2 12/08/2021   HCT 37.8 12/08/2021   PLT 412.0 (H) 12/08/2021   GLUCOSE 88 12/08/2021   CHOL 177 12/08/2021   TRIG 66.0 12/08/2021   HDL 63.50 12/08/2021   LDLCALC 101 (H) 12/08/2021   ALT 13 12/08/2021   AST 14 12/08/2021   NA 135 12/08/2021   K 4.7 12/08/2021   CL 100 12/08/2021   CREATININE 0.94 12/08/2021   BUN 11 12/08/2021   CO2 29 12/08/2021   TSH 0.86 04/07/2011   MICROALBUR 1.5 08/27/2017    Lab Results  Component Value Date   TSH 0.86 04/07/2011   Lab Results  Component Value Date   WBC 10.9 (H) 12/08/2021   HGB 12.2 12/08/2021   HCT 37.8 12/08/2021   MCV 79.8 12/08/2021   PLT 412.0 (H) 12/08/2021   Lab Results  Component Value Date   NA 135 12/08/2021   K 4.7 12/08/2021   CO2 29 12/08/2021   GLUCOSE 88 12/08/2021   BUN 11 12/08/2021   CREATININE 0.94 12/08/2021   BILITOT 0.7 12/08/2021   ALKPHOS 74 12/08/2021   AST 14 12/08/2021   ALT 13 12/08/2021   PROT 7.8 12/08/2021   ALBUMIN 4.3 12/08/2021   CALCIUM 9.6 12/08/2021   GFR 59.86 (L) 12/08/2021   Lab Results  Component Value Date   CHOL 177 12/08/2021   Lab Results  Component Value Date  HDL 63.50 12/08/2021   Lab Results  Component Value Date   LDLCALC 101 (H) 12/08/2021   Lab Results  Component Value Date   TRIG 66.0 12/08/2021   Lab Results  Component Value Date   CHOLHDL 3 12/08/2021   No results found for: "HGBA1C"     Assessment & Plan:   Problem List Items Addressed This Visit   None Visit Diagnoses     Sore throat    -  Primary   Relevant Medications   amoxicillin-clavulanate (AUGMENTIN) 875-125 MG tablet   Other Relevant Orders   CBC with Differential/Platelet (Completed)   Lipid panel (Completed)   Comprehensive metabolic panel (Completed)   Monospot (Completed)   Epstein-Barr virus VCA antibody  panel   DG Chest 2 View   Other fatigue       Relevant Orders   CBC with Differential/Platelet (Completed)   Lipid panel (Completed)   Comprehensive metabolic panel (Completed)   Monospot (Completed)   Epstein-Barr virus VCA antibody panel   DG Chest 2 View   OSA (obstructive sleep apnea)       Relevant Orders   Ambulatory referral to Pulmonology   Hyperlipidemia, unspecified hyperlipidemia type       Relevant Orders   Lipid panel (Completed)   History of non-Hodgkin's lymphoma       Relevant Orders   DG Chest 2 View       Meds ordered this encounter  Medications   amoxicillin-clavulanate (AUGMENTIN) 875-125 MG tablet    Sig: Take 1 tablet by mouth 2 (two) times daily.    Dispense:  20 tablet    Refill:  0    I, [name], DO, personally preformed the services described in this documentation.  All medical record entries made by the scribe were at my direction and in my presence.  I have reviewed the chart and discharge instructions (if applicable) and agree that the record reflects my personal performance and is accurate and complete. 12/08/2021   I,Amber Collins,acting as a scribe for [employer], DO.,have documented all relevant documentation on the behalf of [name], DO,as directed by  [name], DO while in the presence of [name], DO.   [name], DO

## 2021-12-09 LAB — EPSTEIN-BARR VIRUS VCA ANTIBODY PANEL
EBV NA IgG: >600 U/mL — ABNORMAL HIGH
EBV VCA IgG: >750 U/mL — ABNORMAL HIGH
EBV VCA IgM: <36 U/mL

## 2021-12-11 ENCOUNTER — Encounter: Payer: Medicare Other | Admitting: Gastroenterology

## 2021-12-11 ENCOUNTER — Ambulatory Visit: Payer: Medicare Other | Admitting: Family Medicine

## 2021-12-19 ENCOUNTER — Encounter: Payer: Medicare Other | Admitting: Gastroenterology

## 2021-12-30 ENCOUNTER — Ambulatory Visit (AMBULATORY_SURGERY_CENTER): Payer: Medicare Other | Admitting: Gastroenterology

## 2021-12-30 ENCOUNTER — Encounter: Payer: Self-pay | Admitting: Gastroenterology

## 2021-12-30 VITALS — BP 118/71 | HR 65 | Temp 97.1°F | Resp 10 | Ht 63.0 in | Wt 205.0 lb

## 2021-12-30 DIAGNOSIS — G4733 Obstructive sleep apnea (adult) (pediatric): Secondary | ICD-10-CM | POA: Diagnosis not present

## 2021-12-30 DIAGNOSIS — Z1211 Encounter for screening for malignant neoplasm of colon: Secondary | ICD-10-CM | POA: Diagnosis not present

## 2021-12-30 DIAGNOSIS — I10 Essential (primary) hypertension: Secondary | ICD-10-CM | POA: Diagnosis not present

## 2021-12-30 MED ORDER — SODIUM CHLORIDE 0.9 % IV SOLN: 500.0000 mL | Freq: Once | INTRAVENOUS | Status: DC

## 2021-12-30 NOTE — Patient Instructions (Addendum)
Toileting tips to help with your constipation - Drink at least 64-80 ounces of water/liquid per day. - Establish a time to try to move your bowels every day.  For many people, this is after a cup of coffee or after a meal such as breakfast. - Sit all of the way back on the toilet keeping your back fairly straight and while sitting up, try to rest the tops of your forearms on your upper thighs.   - Raising your feet with a step stool/squatty potty can be helpful to improve the angle that allows your stool to pass through the rectum. - Relax the rectum feeling it bulge toward the toilet water.  If you feel your rectum raising toward your body, you are contracting rather than relaxing. - Breathe in and slowly exhale. "Belly breath" by expanding your belly towards your belly button. Keep belly expanded as you gently direct pressure down and back to the anus.  A low pitched GRRR sound can assist with increasing intra-abdominal pressure.  - Repeat 3-4 times. If unsuccessful, contract the pelvic floor to restore normal tone and get off the toilet.  Avoid excessive straining. - To reduce excessive wiping by teaching your anus to normally contract, place hands on outer aspect of knees and resist knee movement outward.  Hold 5-10 second then place hands just inside of knees and resist inward movement of knees.  Hold 5 seconds.  Repeat a few times each way.  YOU HAD AN ENDOSCOPIC PROCEDURE TODAY AT [facility]:   Refer to the procedure report that was given to you for any specific questions about what was found during the examination.  If the procedure report does not answer your questions, please call your gastroenterologist to clarify.  If you requested that your care partner not be given the details of your procedure findings, then the procedure report has been included in a sealed envelope for you to review at your convenience later.  YOU SHOULD EXPECT: Some feelings of bloating in the abdomen.  Passage of more gas than usual.  Walking can help get rid of the air that was put into your GI tract during the procedure and reduce the bloating. If you had a lower endoscopy (such as a colonoscopy or flexible sigmoidoscopy) you may notice spotting of blood in your stool or on the toilet paper. If you underwent a bowel prep for your procedure, you may not have a normal bowel movement for a few days.  Please Note:  You might notice some irritation and congestion in your nose or some drainage.  This is from the oxygen used during your procedure.  There is no need for concern and it should clear up in a day or so.  SYMPTOMS TO REPORT IMMEDIATELY:  Following lower endoscopy (colonoscopy or flexible sigmoidoscopy):  Excessive amounts of blood in the stool  Significant tenderness or worsening of abdominal pains  Swelling of the abdomen that is new, acute  Fever of 100F or higher  Following upper endoscopy (EGD)  Vomiting of blood or coffee ground material  New chest pain or pain under the shoulder blades  Painful or persistently difficult swallowing  New shortness of breath  Fever of 100F or higher  Black, tarry-looking stools  For urgent or emergent issues, a gastroenterologist can be reached at any hour by calling [phone]. Do not use MyChart messaging for urgent concerns.    DIET:  We do recommend a small meal at first, but then you  may proceed to your regular diet.  Drink plenty of fluids but you should avoid alcoholic beverages for 24 hours.  ACTIVITY:  You should plan to take it easy for the rest of today and you should NOT DRIVE or use heavy machinery until tomorrow (because of the sedation medicines used during the test).    FOLLOW UP: Our staff will call the number listed on your records the next business day following your procedure.  We will call around 7:15- 8:00 am to check on you and address any questions or concerns that you may have regarding the information given to  you following your procedure. If we do not reach you, we will leave a message.  If you develop any symptoms (ie: fever, flu-like symptoms, shortness of breath, cough etc.) before then, please call [phone].  If you test positive for Covid 19 in the 2 weeks post procedure, please call and report this information to [place].    If any biopsies were taken you will be contacted by phone or by letter within the next 1-3 weeks.  Please call us at [phone] if you have not heard about the biopsies in 3 weeks.    SIGNATURES/CONFIDENTIALITY: You and/or your care partner have signed paperwork which will be entered into your electronic medical record.  These signatures attest to the fact that that the information above on your After Visit Summary has been reviewed and is understood.  Full responsibility of the confidentiality of this discharge information lies with you and/or your care-partner.

## 2021-12-30 NOTE — Progress Notes (Signed)
Called to room to assist during endoscopic procedure.  Patient ID and intended procedure confirmed with present staff. Received instructions for my participation in the procedure from the performing physician.  

## 2021-12-30 NOTE — Progress Notes (Signed)
GASTROENTEROLOGY PROCEDURE H&P NOTE   Primary Care Physician: Carollee Herter, Alferd Apa, DO  HPI: Candice Bennett is a 76 y.o. female who presents for colonoscopy for screening.  Past Medical History:  Diagnosis Date   Follicular lymphoma ([place])    OSA (obstructive sleep apnea)    Sarcoidosis    Unspecified essential hypertension    Past Surgical History:  Procedure Laterality Date   ABDOMINAL HYSTERECTOMY  1983   CARPAL TUNNEL RELEASE     LYMPH NODE BIOPSY     port a cath insertion  2005   PORT-A-CATH REMOVAL  2006   TUBAL LIGATION  1978   Current Outpatient Medications  Medication Sig Dispense Refill   amLODipine (NORVASC) 5 MG tablet Take 1 tablet by mouth once daily 90 tablet 0   amoxicillin-clavulanate (AUGMENTIN) 875-125 MG tablet Take 1 tablet by mouth 2 (two) times daily. 20 tablet 0   aspirin EC 81 MG tablet Take 81 mg by mouth daily. Swallow whole.     clotrimazole-betamethasone (LOTRISONE) cream APPLY  CREAM TOPICALLY TWICE DAILY 45 g 0   labetalol (NORMODYNE) 100 MG tablet Take 1 tablet by mouth twice daily 180 tablet 0   mometasone (ELOCON) 0.1 % cream APPLY  CREAM TOPICALLY TO AFFECTED AREA(S) ONCE DAILY 45 g 1   No current facility-administered medications for this visit.    Current Outpatient Medications:    amLODipine (NORVASC) 5 MG tablet, Take 1 tablet by mouth once daily, Disp: 90 tablet, Rfl: 0   amoxicillin-clavulanate (AUGMENTIN) 875-125 MG tablet, Take 1 tablet by mouth 2 (two) times daily., Disp: 20 tablet, Rfl: 0   aspirin EC 81 MG tablet, Take 81 mg by mouth daily. Swallow whole., Disp: , Rfl:    clotrimazole-betamethasone (LOTRISONE) cream, APPLY  CREAM TOPICALLY TWICE DAILY, Disp: 45 g, Rfl: 0   labetalol (NORMODYNE) 100 MG tablet, Take 1 tablet by mouth twice daily, Disp: 180 tablet, Rfl: 0   mometasone (ELOCON) 0.1 % cream, APPLY  CREAM TOPICALLY TO AFFECTED AREA(S) ONCE DAILY, Disp: 45 g, Rfl: 1 Allergies  Allergen Reactions   Iohexol  Itching   Ivp Dye [Iodinated Contrast Media] Itching   Family History  Problem Relation Age of Onset   Heart disease Mother    Lung cancer Father    Heart attack Father    Heart disease Sister 41       chf   Dementia Sister    Kidney disease Sister    Hypertension Sister    Multiple myeloma Sister    Bell's palsy Sister    Heart attack Sister 86   Diabetes Sister    Prostate cancer Brother    Diabetes Brother    Colon cancer Neg Hx    Colon polyps Neg Hx    Esophageal cancer Neg Hx    Stomach cancer Neg Hx    Rectal cancer Neg Hx    Social History   Socioeconomic History   Marital status: Divorced    Spouse name: Not on file   Number of children: 2   Years of education: Not on file   Highest education level: Not on file  Occupational History   Occupation: Therapist, 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[employer]  Tobacco Use   Smoking status: Never   Smokeless tobacco: Never  Vaping Use   Vaping Use: Never used  Substance and Sexual Activity   Alcohol use: No   Drug use: No   Sexual activity:  Not Currently    Partners: Male  Other Topics Concern   Not on file  Social History Narrative   No regularly exercise   Live with daughter and her daughter   Social Determinants of Health   Financial Resource Strain: Low Risk  (08/19/2021)   Overall Financial Resource Strain (CARDIA)    Difficulty of Paying Living Expenses: Not hard at all  Food Insecurity: No Food Insecurity (08/19/2021)   Hunger Vital Sign    Worried About Running Out of Food in the Last Year: Never true    [place] in the Last Year: Never true  Transportation Needs: No Transportation Needs (08/19/2021)   PRAPARE - Hydrologist (Medical): No    Lack of Transportation (Non-Medical): No  Physical Activity: Inactive (08/19/2021)   Exercise Vital Sign    Days of Exercise per Week: 0 days    Minutes of Exercise per Session: 0 min  Stress: Stress Concern Present  (08/19/2021)   [place]    Feeling of Stress : To some extent  Social Connections: Moderately Integrated (08/19/2021)   Social Connection and Isolation Panel [NHANES]    Frequency of Communication with Friends and Family: Three times a week    Frequency of Social Gatherings with Friends and Family: Once a week    Attends Religious Services: More than 4 times per year    Active Member of [employer] or Organizations: Yes    Attends Archivist Meetings: 1 to 4 times per year    Marital Status: Widowed  Intimate Partner Violence: Not At Risk (08/19/2021)   Humiliation, Afraid, Rape, and Kick questionnaire    Fear of Current or Ex-Partner: No    Emotionally Abused: No    Physically Abused: No    Sexually Abused: No    Physical Exam: There were no vitals filed for this visit. There is no height or weight on file to calculate BMI. GEN: NAD EYE: Sclerae anicteric ENT: MMM CV: Non-tachycardic GI: Soft, NT/ND NEURO:  Alert & Oriented x 3  Lab Results: No results for input(s): "WBC", "HGB", "HCT", "PLT" in the last 72 hours. BMET No results for input(s): "NA", "K", "CL", "CO2", "GLUCOSE", "BUN", "CREATININE", "CALCIUM" in the last 72 hours. LFT No results for input(s): "PROT", "ALBUMIN", "AST", "ALT", "ALKPHOS", "BILITOT", "BILIDIR", "IBILI" in the last 72 hours. PT/INR No results for input(s): "LABPROT", "INR" in the last 72 hours.   Impression / Plan: This is a 76 y.o.female who presents for colonoscopy for screening.  The risks and benefits of endoscopic evaluation/treatment were discussed with the patient and/or family; these include but are not limited to the risk of perforation, infection, bleeding, missed lesions, lack of diagnosis, severe illness requiring hospitalization, as well as anesthesia and sedation related illnesses.  The patient's history has been reviewed, patient examined, no change in status, and  deemed stable for procedure.  The patient and/or family is agreeable to proceed.    Justice Britain, MD [facility] Office # [phone]

## 2021-12-30 NOTE — Progress Notes (Signed)
To pacu, VSS. Report to [email or web address] 

## 2021-12-30 NOTE — Op Note (Signed)
[place] Patient Name: Candice Bennett Procedure Date: 12/30/2021 11:14 AM MRN: [id number] Endoscopist: Justice Britain , MD Age: 75 Referring MD:  Date of Birth: 1945-10-15 Gender: Female Account #: [id number] Procedure:                Colonoscopy Indications:              Screening for colorectal malignant neoplasm Medicines:                Monitored Anesthesia Care Procedure:                Pre-Anesthesia Assessment:                           - Prior to the procedure, a History and Physical                            was performed, and patient medications and                            allergies were reviewed. The patient's tolerance of                            previous anesthesia was also reviewed. The risks                            and benefits of the procedure and the sedation                            options and risks were discussed with the patient.                            All questions were answered, and informed consent                            was obtained. Prior Anticoagulants: The patient has                            taken no previous anticoagulant or antiplatelet                            agents except for aspirin. ASA Grade Assessment: II                            - A patient with mild systemic disease. After                            reviewing the risks and benefits, the patient was                            deemed in satisfactory condition to undergo the                            procedure.  After obtaining informed consent, the colonoscope                            was passed under direct vision. Throughout the                            procedure, the patient's blood pressure, pulse, and                            oxygen saturations were monitored continuously. The                            CF [id number] #[id number] was introduced through the anus                            and advanced to the the cecum,  identified by                            appendiceal orifice and ileocecal valve. The                            colonoscopy was performed without difficulty. The                            patient tolerated the procedure. The quality of the                            bowel preparation was good. The ileocecal valve,                            appendiceal orifice, and rectum were photographed. Scope In: 11:31:06 AM Scope Out: 11:43:23 AM Scope Withdrawal Time: 0 hours 7 minutes 41 seconds  Total Procedure Duration: 0 hours 12 minutes 17 seconds  Findings:                 The digital rectal exam was normal. Pertinent                            negatives include no palpable rectal lesions.                           The colon (entire examined portion) was                            significantly redundant.                           A few small-mouthed diverticula were found in the                            transverse colon.                           Normal mucosa was found in the entire colon  otherwise.                           Non-bleeding non-thrombosed internal hemorrhoids                            were found during retroflexion and during                            endoscopy. The hemorrhoids were Grade I (internal                            hemorrhoids that do not prolapse). Complications:            No immediate complications. Estimated Blood Loss:     Estimated blood loss: none. Impression:               - Redundant colon.                           - A few diverticulae in the transverse colon.                           - Normal mucosa in the entire examined colon                            otherwise.                           - Non-bleeding non-thrombosed internal hemorrhoids. Recommendation:           - The patient will be observed post-procedure,                            until all discharge criteria are met.                           - Discharge  patient to home.                           - Patient has a contact number available for                            emergencies. The signs and symptoms of potential                            delayed complications were discussed with the                            patient. Return to normal activities tomorrow.                            Written discharge instructions were provided to the                            patient.                           -  High fiber diet.                           - Use FiberCon 1-2 tablets PO daily.                           - Continue present medications.                           - Repeat colonoscopy in 10 years for screening                            purposes - if patient's health and comorbidities                            remain in good health (will need to be seen in                            clinic to discuss if we continue or not screening                            however).                           - The findings and recommendations were discussed                            with the patient.                           - The findings and recommendations were discussed                            with the patient's family. Justice Britain, MD 12/30/2021 11:47:39 AM

## 2021-12-31 ENCOUNTER — Telehealth: Payer: Self-pay | Admitting: *Deleted

## 2021-12-31 NOTE — Telephone Encounter (Signed)
  Follow up Call-     12/30/2021   10:41 AM  Call back number  Post procedure Call Back phone  # [phone]  Permission to leave phone message Yes     Patient questions:  Do you have a fever, pain , or abdominal swelling? No. Pain Score  0 *  Have you tolerated food without any problems? Yes.    Have you been able to return to your normal activities? Yes.    Do you have any questions about your discharge instructions: Diet   No. Medications  No. Follow up visit  No.  Do you have questions or concerns about your Care? No.  Actions: * If pain score is 4 or above: No action needed, pain <4.

## 2022-01-26 ENCOUNTER — Ambulatory Visit (INDEPENDENT_AMBULATORY_CARE_PROVIDER_SITE_OTHER): Payer: Medicare Other | Admitting: Adult Health

## 2022-01-26 ENCOUNTER — Encounter: Payer: Self-pay | Admitting: Adult Health

## 2022-01-26 VITALS — BP 110/72 | HR 71 | Temp 97.8°F | Ht 62.5 in | Wt 198.8 lb

## 2022-01-26 DIAGNOSIS — G4719 Other hypersomnia: Secondary | ICD-10-CM | POA: Diagnosis not present

## 2022-01-26 DIAGNOSIS — G4733 Obstructive sleep apnea (adult) (pediatric): Secondary | ICD-10-CM

## 2022-01-26 DIAGNOSIS — E66812 Obesity, class 2: Secondary | ICD-10-CM

## 2022-01-26 DIAGNOSIS — E669 Obesity, unspecified: Secondary | ICD-10-CM

## 2022-01-26 NOTE — Assessment & Plan Note (Signed)
Healthy weight loss 

## 2022-01-26 NOTE — Progress Notes (Signed)
$'@Patient'v$  ID: Candice Bennett, female    DOB: 22-Apr-1946, 76 y.o.   MRN: [id number]  Chief Complaint  Patient presents with   Consult    Referring provider: Carollee Herter, Alferd Apa, *  HPI: 76 yo female presents for sleep consult 01/26/22 to re-establish for OSA   TEST/EVENTS :  Sleep study 07/2016 AHI 14/hr   01/26/2022 Sleep Consult : OSA  Patient presents for a sleep consult to re-establish for sleep apnea. Last seen in office in 2018. Diagnosed with moderate OSA in 2004 , tried CPAP but unable to tolerate. Returned back in 2018 with ongoing snoring and daytime sleepiness. Sleep study 2/208 showed mild to moderate OSA with AHI at 14/hr. Tried CPAP for time, could not tolerate . Stopped wearing it for late 2018. Now returns today with ongoing daytime sleepiness , snoring . Very sleepy during daytime . Falls asleep especially after eating lunch . Has upper and lower dentures .Goes to sleep at 12 midnight. Falls asleep easily , listens to audiobook, up once during nigth . Gets up 6 am if has to work , 9 am if off work. Weight stable , current weight 198lb, BMI 35. No CHF/CVA history.  1 soda daily . No sleep aides. Epworth score 10 out of 24 , gets sleepy at rest and evenings after eating .   SH : Divorced. Adult kids. No smoking , no etoh, no drugs.  Works partime as Community education officer.  Active . Lives at home with daughter.    SH : Hysterectomy , tubal ligation   PMH : HTN, Non Hodgkin's Lymphoma ,     Allergies  Allergen Reactions   Iohexol Itching   Ivp Dye [Iodinated Contrast Media] Itching    Immunization History  Administered Date(s) Administered   Fluad Quad(high Dose 65+) 03/09/2019, 05/14/2020, 06/12/2021   Hepatitis B, adult 05/21/2020, 06/20/2020   Influenza Whole 03/26/2009   Influenza, High Dose Seasonal PF 02/25/2017, 05/16/2018   PFIZER(Purple Top)SARS-COV-2 Vaccination 11/29/2019, 12/24/2019   PPD Test 05/08/2020   Pneumococcal Conjugate-13 01/30/2014    Pneumococcal Polysaccharide-23 12/09/2012   Td 08/10/2000   Tdap 04/07/2011   Zoster, Live 02/10/2010    Past Medical History:  Diagnosis Date   Follicular lymphoma ([place])    OSA (obstructive sleep apnea)    Sarcoidosis    Unspecified essential hypertension     Tobacco History: Social History   Tobacco Use  Smoking Status Never  Smokeless Tobacco Never   Counseling given: Not Answered   Outpatient Medications Prior to Visit  Medication Sig Dispense Refill   amLODipine (NORVASC) 5 MG tablet Take 1 tablet by mouth once daily 90 tablet 0   aspirin EC 81 MG tablet Take 81 mg by mouth daily. Swallow whole.     labetalol (NORMODYNE) 100 MG tablet Take 1 tablet by mouth twice daily 180 tablet 0   mometasone (ELOCON) 0.1 % cream APPLY  CREAM TOPICALLY TO AFFECTED AREA(S) ONCE DAILY 45 g 1   clotrimazole-betamethasone (LOTRISONE) cream APPLY  CREAM TOPICALLY TWICE DAILY 45 g 0   No facility-administered medications prior to visit.     Review of Systems:   Constitutional:   No  weight loss, night sweats,  Fevers, chills,  +fatigue, or  lassitude.  HEENT:   No headaches,  Difficulty swallowing,  Tooth/dental problems, or  Sore throat,                No sneezing, itching, ear ache, nasal congestion, post nasal drip,  CV:  No chest pain,  Orthopnea, PND, swelling in lower extremities, anasarca, dizziness, palpitations, syncope.   GI  No heartburn, indigestion, abdominal pain, nausea, vomiting, diarrhea, change in bowel habits, loss of appetite, bloody stools.   Resp: No shortness of breath with exertion or at rest.  No excess mucus, no productive cough,  No non-productive cough,  No coughing up of blood.  No change in color of mucus.  No wheezing.  No chest wall deformity  Skin: no rash or lesions.  GU: no dysuria, change in color of urine, no urgency or frequency.  No flank pain, no hematuria   MS:  No joint pain or swelling.  No decreased range of motion.  No back  pain.    Physical Exam  BP 110/72 (BP Location: Left Arm, Cuff Size: Normal)   Pulse 71   Temp 97.8 F (36.6 C) (Oral)   Ht 5' 2.5" (1.588 m)   Wt 198 lb 12.8 oz (90.2 kg)   SpO2 99%   BMI 35.78 kg/m   GEN: A/Ox3; pleasant , NAD, well nourished    HEENT:  /AT,    NOSE-clear, THROAT-clear, no lesions, no postnasal drip or exudate noted.  Class 2-3 MP airway   NECK:  Supple w/ fair ROM; no JVD; normal carotid impulses w/o bruits; no thyromegaly or nodules palpated; no lymphadenopathy.    RESP  Clear  P & A; w/o, wheezes/ rales/ or rhonchi. no accessory muscle use, no dullness to percussion  CARD:  RRR, no m/r/g, no peripheral edema, pulses intact, no cyanosis or clubbing.  GI:   Soft & nt; nml bowel sounds; no organomegaly or masses detected.   Musco: Warm bil, no deformities or joint swelling noted.   Neuro: alert, no focal deficits noted.    Skin: Warm, no lesions or rashes    Lab Results:   BMET   BNP No results found for: "BNP"  ProBNP No results found for: "PROBNP"  Imaging: No results found.        No data to display          No results found for: "NITRICOXIDE"      Assessment & Plan:   Obesity, Class II, BMI 35-39.9 Healthy weight loss.   Obstructive sleep apnea Moderate OSA- difficulty tolerating CPAP in past. Patient education on OSA and CPAP . She has high symptom burden with significant daytime sleepiness. Has agreed to try CPAP again . We looked at dream nasal mask which she feel may work well with her. Will need to set up for home sleep.  Went over [employer] device -she is not interested. Not a candidate for oral appliance  - discussed how weight can impact sleep and risk for sleep disordered breathing - discussed options to assist with weight loss: combination of diet modification, cardiovascular and strength training exercises   - had an extensive discussion regarding the adverse health consequences related to untreated sleep  disordered breathing - specifically discussed the risks for hypertension, coronary artery disease, cardiac dysrhythmias, cerebrovascular disease, and diabetes - lifestyle modification discussed   - discussed how sleep disruption can increase risk of accidents, particularly when driving - safe driving practices were discussed       Rexene Edison, NP 01/26/2022

## 2022-01-26 NOTE — Patient Instructions (Signed)
Set up Home sleep test.  Healthy sleep regimen  Do not drive if sleepy  Follow up in 2 months to discuss test results and treatment plan .

## 2022-01-26 NOTE — Assessment & Plan Note (Signed)
Moderate OSA- difficulty tolerating CPAP in past. Patient education on OSA and CPAP . She has high symptom burden with significant daytime sleepiness. Has agreed to try CPAP again . We looked at dream nasal mask which she feel may work well with her. Will need to set up for home sleep.  Went over [employer] device -she is not interested. Not a candidate for oral appliance  - discussed how weight can impact sleep and risk for sleep disordered breathing - discussed options to assist with weight loss: combination of diet modification, cardiovascular and strength training exercises   - had an extensive discussion regarding the adverse health consequences related to untreated sleep disordered breathing - specifically discussed the risks for hypertension, coronary artery disease, cardiac dysrhythmias, cerebrovascular disease, and diabetes - lifestyle modification discussed   - discussed how sleep disruption can increase risk of accidents, particularly when driving - safe driving practices were discussed

## 2022-01-26 NOTE — Progress Notes (Signed)
Reviewed and agree with assessment/plan.   Chesley Mires, MD [facility] Pulmonary/Critical Care 01/26/2022, 1:30 PM Pager:  [phone]

## 2022-03-02 ENCOUNTER — Other Ambulatory Visit: Payer: Self-pay | Admitting: Family Medicine

## 2022-03-02 DIAGNOSIS — I10 Essential (primary) hypertension: Secondary | ICD-10-CM

## 2022-03-16 ENCOUNTER — Encounter: Payer: Self-pay | Admitting: Family Medicine

## 2022-03-16 ENCOUNTER — Ambulatory Visit (INDEPENDENT_AMBULATORY_CARE_PROVIDER_SITE_OTHER): Payer: Medicare Other | Admitting: Family Medicine

## 2022-03-16 VITALS — BP 120/90 | HR 75 | Temp 97.9°F | Resp 18 | Ht 62.5 in | Wt 199.6 lb

## 2022-03-16 DIAGNOSIS — Z23 Encounter for immunization: Secondary | ICD-10-CM | POA: Diagnosis not present

## 2022-03-16 DIAGNOSIS — I10 Essential (primary) hypertension: Secondary | ICD-10-CM | POA: Diagnosis not present

## 2022-03-16 DIAGNOSIS — E2839 Other primary ovarian failure: Secondary | ICD-10-CM

## 2022-03-16 LAB — COMPREHENSIVE METABOLIC PANEL
ALT: 10 U/L (ref 0–35)
AST: 14 U/L (ref 0–37)
Albumin: 4.1 g/dL (ref 3.5–5.2)
Alkaline Phosphatase: 67 U/L (ref 39–117)
BUN: 10 mg/dL (ref 6–23)
CO2: 30 mEq/L (ref 19–32)
Calcium: 9.2 mg/dL (ref 8.4–10.5)
Chloride: 103 mEq/L (ref 96–112)
Creatinine, Ser: 0.88 mg/dL (ref 0.40–1.20)
GFR: 64.67 mL/min (ref >60.00)
Glucose, Bld: 100 mg/dL — ABNORMAL HIGH (ref 70–99)
Potassium: 4.3 mEq/L (ref 3.5–5.1)
Sodium: 140 mEq/L (ref 135–145)
Total Bilirubin: 0.4 mg/dL (ref 0.2–1.2)
Total Protein: 7.3 g/dL (ref 6.0–8.3)

## 2022-03-16 LAB — CBC WITH DIFFERENTIAL/PLATELET
Basophils Absolute: 0 10*3/uL (ref 0.0–0.1)
Basophils Relative: 0.7 % (ref 0.0–3.0)
Eosinophils Absolute: 0.2 10*3/uL (ref 0.0–0.7)
Eosinophils Relative: 3.8 % (ref 0.0–5.0)
HCT: 36.3 % (ref 36.0–46.0)
Hemoglobin: 11.6 g/dL — ABNORMAL LOW (ref 12.0–15.0)
Lymphocytes Relative: 45 % (ref 12.0–46.0)
Lymphs Abs: 2.7 10*3/uL (ref 0.7–4.0)
MCHC: 32 g/dL (ref 30.0–36.0)
MCV: 81.2 fl (ref 78.0–100.0)
Monocytes Absolute: 0.4 10*3/uL (ref 0.1–1.0)
Monocytes Relative: 6.7 % (ref 3.0–12.0)
Neutro Abs: 2.6 10*3/uL (ref 1.4–7.7)
Neutrophils Relative %: 43.8 % (ref 43.0–77.0)
Platelets: 336 10*3/uL (ref 150.0–400.0)
RBC: 4.47 Mil/uL (ref 3.87–5.11)
RDW: 15.3 % (ref 11.5–15.5)
WBC: 6 10*3/uL (ref 4.0–10.5)

## 2022-03-16 LAB — LIPID PANEL
Cholesterol: 166 mg/dL (ref 0–200)
HDL: 59.9 mg/dL (ref >39.00)
LDL Cholesterol: 87 mg/dL (ref 0–99)
NonHDL: 106.05
Total CHOL/HDL Ratio: 3
Triglycerides: 94 mg/dL (ref 0.0–149.0)
VLDL: 18.8 mg/dL (ref 0.0–40.0)

## 2022-03-16 NOTE — Patient Instructions (Signed)

## 2022-03-16 NOTE — Progress Notes (Signed)
Subjective:   By signing my name below, I, Carylon Perches, attest that this documentation has been prepared under the direction and in the presence of [name] DO 03/16/2022   Patient ID: Candice Bennett, female    DOB: 1945-08-20, 76 y.o.   MRN: [id number]  Chief Complaint  Patient presents with   Hypertension   Follow-up    HPI Patient is in today for an office visit  She complains of right shoulder pain. Symptoms are not constant but does worsen when she's in bed. She reports that she had a recent fall. She was getting out of her car and fell due to her sandals. She is not sure if the fall worsened her shoulder pain because symptoms were present before the incident.   She does not regularly check her blood pressure. Her diastolic reading is elevating. She reports that she took her 5 Mg of Amlodipine and 100 Mg of Normodyne prior to today's visit. BP Readings from Last 3 Encounters:  03/16/22 (!) 120/90  01/26/22 110/72  12/30/21 118/71   Pulse Readings from Last 3 Encounters:  03/16/22 75  01/26/22 71  12/30/21 65   She is waiting to hear back from her pulmonologist to receive an at home sleep machine due to her sleep apnea. Her symptoms are still persistent.  She is not UTD on her Shingles or tetanus vaccine. She is UTD on her pneumonia vaccine. She has received 2 Covid-19 vaccines.  She is interested in receiving an influenza vaccine during today's visit.   She reports that her sister Angeline passed from heart symptoms. Her sister, Benjamine Mola also passed away from a stroke. She denies of any new surgeries.  Mammogram last completed on 07/09/2021 Colonoscopy last completed on 12/30/2021. Dexa last completed on 06/09/2019. She is not regularly exercising. She is not UTD on vision exams She is UTD on dental exams  Past Medical History:  Diagnosis Date   Follicular lymphoma ([facility])    OSA (obstructive sleep apnea)    Sarcoidosis    Unspecified essential  hypertension     Past Surgical History:  Procedure Laterality Date   ABDOMINAL HYSTERECTOMY  1983   CARPAL TUNNEL RELEASE     LYMPH NODE BIOPSY     port a cath insertion  2005   PORT-A-CATH REMOVAL  2006   TUBAL LIGATION  1978    Family History  Problem Relation Age of Onset   Heart disease Mother    Lung cancer Father    Heart attack Father    Heart failure Sister    Heart disease Sister 47       chf   Dementia Sister    Kidney disease Sister    Frontotemporal dementia Sister    Hypertension Sister    Multiple myeloma Sister    COPD Sister    Heart attack Sister 86   Bell's palsy Sister    Transient ischemic attack Sister    Diabetes Sister    Prostate cancer Brother    Diabetes Brother    Colon cancer Neg Hx    Colon polyps Neg Hx    Esophageal cancer Neg Hx    Stomach cancer Neg Hx    Rectal cancer Neg Hx     Social History   Socioeconomic History   Marital status: Divorced    Spouse name: Not on file   Number of children: 2   Years of education: Not on file   Highest education level: Not  on file  Occupational History   Occupation: Therapist, 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[employer]  Tobacco Use   Smoking status: Never   Smokeless tobacco: Never  Vaping Use   Vaping Use: Never used  Substance and Sexual Activity   Alcohol use: No   Drug use: No   Sexual activity: Not Currently    Partners: Male  Other Topics Concern   Not on file  Social History Narrative   No regularly exercise   Live with daughter and her daughter   Social Determinants of Health   Financial Resource Strain: Low Risk  (08/19/2021)   Overall Financial Resource Strain (CARDIA)    Difficulty of Paying Living Expenses: Not hard at all  Food Insecurity: No Food Insecurity (08/19/2021)   Hunger Vital Sign    Worried About Running Out of Food in the Last Year: Never true    Ran Out of Food in the Last Year: Never true  Transportation Needs: No Transportation Needs  (08/19/2021)   PRAPARE - Hydrologist (Medical): No    Lack of Transportation (Non-Medical): No  Physical Activity: Inactive (08/19/2021)   Exercise Vital Sign    Days of Exercise per Week: 0 days    Minutes of Exercise per Session: 0 min  Stress: Stress Concern Present (08/19/2021)   [place]    Feeling of Stress : To some extent  Social Connections: Moderately Integrated (08/19/2021)   Social Connection and Isolation Panel [NHANES]    Frequency of Communication with Friends and Family: Three times a week    Frequency of Social Gatherings with Friends and Family: Once a week    Attends Religious Services: More than 4 times per year    Active Member of [employer] or Organizations: Yes    Attends Archivist Meetings: 1 to 4 times per year    Marital Status: Widowed  Intimate Partner Violence: Not At Risk (08/19/2021)   Humiliation, Afraid, Rape, and Kick questionnaire    Fear of Current or Ex-Partner: No    Emotionally Abused: No    Physically Abused: No    Sexually Abused: No    Outpatient Medications Prior to Visit  Medication Sig Dispense Refill   amLODipine (NORVASC) 5 MG tablet Take 1 tablet by mouth once daily 90 tablet 0   aspirin EC 81 MG tablet Take 81 mg by mouth daily. Swallow whole.     labetalol (NORMODYNE) 100 MG tablet Take 1 tablet by mouth twice daily 180 tablet 0   mometasone (ELOCON) 0.1 % cream APPLY  CREAM TOPICALLY TO AFFECTED AREA(S) ONCE DAILY 45 g 1   No facility-administered medications prior to visit.    Allergies  Allergen Reactions   Iohexol Itching   Ivp Dye [Iodinated Contrast Media] Itching    Review of Systems  Constitutional:  Negative for fever and malaise/fatigue.  HENT:  Negative for congestion.   Eyes:  Negative for blurred vision.  Respiratory:  Negative for cough and shortness of breath.   Cardiovascular:  Negative for chest pain,  palpitations and leg swelling.  Gastrointestinal:  Negative for abdominal pain, blood in stool, nausea and vomiting.  Genitourinary:  Negative for dysuria and frequency.  Musculoskeletal:  Positive for myalgias (Right Shoulder). Negative for back pain and falls.  Skin:  Negative for rash.  Neurological:  Negative for dizziness, loss of consciousness and headaches.  Endo/Heme/Allergies:  Negative for  environmental allergies.  Psychiatric/Behavioral:  Negative for depression. The patient is not nervous/anxious.        Objective:    Physical Exam Vitals and nursing note reviewed.  Constitutional:      General: She is not in acute distress.    Appearance: Normal appearance. She is not ill-appearing.  HENT:     Head: Normocephalic and atraumatic.     Right Ear: Tympanic membrane, ear canal and external ear normal.     Left Ear: Tympanic membrane, ear canal and external ear normal.     Mouth/Throat:     Pharynx: No oropharyngeal exudate.  Eyes:     Extraocular Movements: Extraocular movements intact.     Pupils: Pupils are equal, round, and reactive to light.  Neck:     Thyroid: No thyromegaly.  Cardiovascular:     Rate and Rhythm: Normal rate and regular rhythm.     Heart sounds: Normal heart sounds. No murmur heard.    No gallop.  Pulmonary:     Effort: Pulmonary effort is normal. No respiratory distress.     Breath sounds: Normal breath sounds. No wheezing or rales.  Abdominal:     General: Bowel sounds are normal. There is no distension.     Palpations: Abdomen is soft.     Tenderness: There is no abdominal tenderness. There is no guarding.  Lymphadenopathy:     Cervical: No cervical adenopathy.  Skin:    General: Skin is warm and dry.  Neurological:     Mental Status: She is alert and oriented to person, place, and time.  Psychiatric:        Mood and Affect: Mood normal.        Behavior: Behavior normal.        Judgment: Judgment normal.     BP (!) 120/90 (BP  Location: Right Arm, Patient Position: Sitting, Cuff Size: Large)   Pulse 75   Temp 97.9 F (36.6 C) (Oral)   Resp 18   Ht 5' 2.5" (1.588 m)   Wt 199 lb 9.6 oz (90.5 kg)   SpO2 95%   BMI 35.93 kg/m  Wt Readings from Last 3 Encounters:  03/16/22 199 lb 9.6 oz (90.5 kg)  01/26/22 198 lb 12.8 oz (90.2 kg)  12/30/21 205 lb (93 kg)    Diabetic Foot Exam - Simple   No data filed    Lab Results  Component Value Date   WBC 10.9 (H) 12/08/2021   HGB 12.2 12/08/2021   HCT 37.8 12/08/2021   PLT 412.0 (H) 12/08/2021   GLUCOSE 88 12/08/2021   CHOL 177 12/08/2021   TRIG 66.0 12/08/2021   HDL 63.50 12/08/2021   LDLCALC 101 (H) 12/08/2021   ALT 13 12/08/2021   AST 14 12/08/2021   NA 135 12/08/2021   K 4.7 12/08/2021   CL 100 12/08/2021   CREATININE 0.94 12/08/2021   BUN 11 12/08/2021   CO2 29 12/08/2021   TSH 0.86 04/07/2011   MICROALBUR 1.5 08/27/2017    Lab Results  Component Value Date   TSH 0.86 04/07/2011   Lab Results  Component Value Date   WBC 10.9 (H) 12/08/2021   HGB 12.2 12/08/2021   HCT 37.8 12/08/2021   MCV 79.8 12/08/2021   PLT 412.0 (H) 12/08/2021   Lab Results  Component Value Date   NA 135 12/08/2021   K 4.7 12/08/2021   CO2 29 12/08/2021   GLUCOSE 88 12/08/2021   BUN 11 12/08/2021   CREATININE  0.94 12/08/2021   BILITOT 0.7 12/08/2021   ALKPHOS 74 12/08/2021   AST 14 12/08/2021   ALT 13 12/08/2021   PROT 7.8 12/08/2021   ALBUMIN 4.3 12/08/2021   CALCIUM 9.6 12/08/2021   GFR 59.86 (L) 12/08/2021   Lab Results  Component Value Date   CHOL 177 12/08/2021   Lab Results  Component Value Date   HDL 63.50 12/08/2021   Lab Results  Component Value Date   LDLCALC 101 (H) 12/08/2021   Lab Results  Component Value Date   TRIG 66.0 12/08/2021   Lab Results  Component Value Date   CHOLHDL 3 12/08/2021   No results found for: "HGBA1C"     Assessment & Plan:   Problem List Items Addressed This Visit   None Visit Diagnoses      Primary hypertension    -  Primary   Relevant Orders   CBC with Differential/Platelet   Comprehensive metabolic panel   Lipid panel   Need for influenza vaccination       Relevant Orders   Flu Vaccine QUAD High Dose(Fluad) (Completed)   Estrogen deficiency       Relevant Orders   DG Bone Density      No orders of the defined types were placed in this encounter.   IAnn Held, DO, personally preformed the services described in this documentation.  All medical record entries made by the scribe were at my direction and in my presence.  I have reviewed the chart and discharge instructions (if applicable) and agree that the record reflects my personal performance and is accurate and complete. 03/16/2022   I,Amber Collins,acting as a scribe for [name], DO.,have documented all relevant documentation on the behalf of [name], DO,as directed by  [name], DO while in the presence of [name], DO.    [name], DO

## 2022-03-25 ENCOUNTER — Ambulatory Visit: Payer: Medicare Other

## 2022-03-25 DIAGNOSIS — G4733 Obstructive sleep apnea (adult) (pediatric): Secondary | ICD-10-CM | POA: Diagnosis not present

## 2022-03-25 DIAGNOSIS — G4719 Other hypersomnia: Secondary | ICD-10-CM

## 2022-03-26 ENCOUNTER — Other Ambulatory Visit: Payer: Self-pay | Admitting: Family Medicine

## 2022-03-26 DIAGNOSIS — G4733 Obstructive sleep apnea (adult) (pediatric): Secondary | ICD-10-CM

## 2022-03-26 DIAGNOSIS — Z1231 Encounter for screening mammogram for malignant neoplasm of breast: Secondary | ICD-10-CM

## 2022-04-07 ENCOUNTER — Telehealth: Payer: Self-pay | Admitting: Adult Health

## 2022-04-07 NOTE — Telephone Encounter (Signed)
Home sleep study March 25, 2022 showed moderate sleep apnea with AHI at 18.9/hour and SPO2 low at 81%.  Please set up office visit to discuss sleep study results and treatment plan

## 2022-04-07 NOTE — Telephone Encounter (Signed)
Spoke with and scheduled her of OV on 05/01/22. Nothing further needed at this time.

## 2022-04-07 NOTE — Telephone Encounter (Signed)
ATC x1.  LVM to return call. 

## 2022-04-07 NOTE — Telephone Encounter (Signed)
-----   Message from Donne Hazel sent at 03/31/2022 12:09 PM EDT ----- Hello  this patient's HST report is ready for review

## 2022-04-14 ENCOUNTER — Telehealth: Payer: Self-pay

## 2022-04-14 NOTE — Telephone Encounter (Signed)
Called and spoke to patient and offered mychart visit today. 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 She requested appt on 04/24/2022 or 04/25/22 as those are teacher work days. Mychart appt scheduled 04/24/2022 at 9:00. Nothing further needed.

## 2022-04-14 NOTE — Telephone Encounter (Signed)
-----   Message from Melvenia Needles, NP sent at 04/14/2022  9:10 AM EDT ----- Can we get this patient set up for office or virtual visit to discuss sleep results  ----- Message ----- From: Laban Emperor T Sent: 03/31/2022  12:09 PM EDT To: Melvenia Needles, NP  Hello  this patient's HST report is ready for review

## 2022-04-24 ENCOUNTER — Telehealth (INDEPENDENT_AMBULATORY_CARE_PROVIDER_SITE_OTHER): Payer: Medicare Other | Admitting: Adult Health

## 2022-04-24 DIAGNOSIS — G4733 Obstructive sleep apnea (adult) (pediatric): Secondary | ICD-10-CM

## 2022-04-24 NOTE — Progress Notes (Signed)
Reviewed and agree with assessment/plan.   Chesley Mires, MD [facility] Pulmonary/Critical Care 04/24/2022, 11:27 AM Pager:  [phone]

## 2022-04-24 NOTE — Patient Instructions (Addendum)
Begin CPAP At bedtime  , wear all night long for greater than 6hrs .  Try Dream wear nasal .  Do not drive if sleepy  Remain active and work on weight loss.  Follow up in 2 months and As needed

## 2022-04-24 NOTE — Progress Notes (Signed)
Virtual Visit via Video Note  I connected with Candice Bennett on 04/24/22 at  9:00 AM EDT by a video enabled telemedicine application and verified that I am speaking with the correct person using two identifiers.  Location: Patient: Home  Provider: Office    I discussed the limitations of evaluation and management by telemedicine and the availability of in person appointments. The patient expressed understanding and agreed to proceed.  History of Present Illness: 76 year old female followed for obstructive sleep apnea  Today's video visit is a frequent follow-up to discuss sleep study results.  Patient was seen last visit to reestablish for sleep apnea.  She was originally diagnosed with moderate sleep apnea in 2004.  Tried CPAP briefly but was unable to tolerate it.  She returned back in 2018 with sleep apnea was started back on CPAP but did not tolerate.  Patient returned last visit with significant symptom burden with daytime sleepiness restless sleep and fatigue.  She was set up for a repeat sleep study that was done March 25, 2022.  This showed moderate sleep apnea with AHI at 18.9/hour and SPO2 low at 81%.  We went over her sleep study results.  Went over treatment options including weight loss, oral appliance and CPAP.  We discussed using a oral appliance.  Patient does not want to be referred for inspire.  She says she does want to try the CPAP once again to see if she can use this to help with her symptoms.  Past Medical History:  Diagnosis Date   Follicular lymphoma ([place])    OSA (obstructive sleep apnea)    Sarcoidosis    Unspecified essential hypertension    Current Outpatient Medications on File Prior to Visit  Medication Sig Dispense Refill   amLODipine (NORVASC) 5 MG tablet Take 1 tablet by mouth once daily 90 tablet 0   aspirin EC 81 MG tablet Take 81 mg by mouth daily. Swallow whole.     labetalol (NORMODYNE) 100 MG tablet Take 1 tablet by mouth twice daily 180 tablet 0    mometasone (ELOCON) 0.1 % cream APPLY  CREAM TOPICALLY TO AFFECTED AREA(S) ONCE DAILY (Patient taking differently: daily as needed. APPLY  CREAM TOPICALLY TO AFFECTED AREA(S) ONCE DAILY) 45 g 1   No current facility-administered medications on file prior to visit.      Observations/Objective: Home sleep study March 25, 2022 showed moderate sleep apnea with AHI at 18.9/hour and SPO2 low at 81%   Sleep study 07/2016 AHI 14/hr   Assessment and Plan: Moderate obstructive sleep apnea with significant symptom burden we will begin CPAP once again.  Helpful hints to help tolerate CPAP.  We will try a smaller mask with DreamWear nasal mask. - discussed how weight can impact sleep and risk for sleep disordered breathing - discussed options to assist with weight loss: combination of diet modification, cardiovascular and strength training exercises   - had an extensive discussion regarding the adverse health consequences related to untreated sleep disordered breathing - specifically discussed the risks for hypertension, coronary artery disease, cardiac dysrhythmias, cerebrovascular disease, and diabetes - lifestyle modification discussed   - discussed how sleep disruption can increase risk of accidents, particularly when driving - safe driving practices were discussed     Plan  Patient Instructions  Begin CPAP At bedtime  , wear all night long for greater than 6hrs .  Try Dream wear nasal .  Do not drive if sleepy  Remain active and work on weight loss.  Follow up in 2 months and As needed       Follow Up Instructions:    I discussed the assessment and treatment plan with the patient. The patient was provided an opportunity to ask questions and all were answered. The patient agreed with the plan and demonstrated an understanding of the instructions.   The patient was advised to call back or seek an in-person evaluation if the symptoms worsen or if the condition fails to improve as  anticipated.  I provided 20  minutes of non-face-to-face time during this encounter.   Rexene Edison, NP

## 2022-04-27 ENCOUNTER — Telehealth: Payer: Self-pay | Admitting: *Deleted

## 2022-04-27 NOTE — Telephone Encounter (Signed)
Called to schedule 2 month f/u with patient.  She preferred to wait until she gets the CPAP machine and call to schedule the 2 month follow.  Will await her call after receiving her CPAP machine.

## 2022-05-07 ENCOUNTER — Encounter: Payer: Self-pay | Admitting: Family Medicine

## 2022-05-07 ENCOUNTER — Ambulatory Visit (INDEPENDENT_AMBULATORY_CARE_PROVIDER_SITE_OTHER): Payer: Medicare Other | Admitting: Family Medicine

## 2022-05-07 VITALS — BP 126/82 | HR 82 | Temp 97.9°F | Resp 18 | Ht 62.5 in | Wt 200.6 lb

## 2022-05-07 DIAGNOSIS — M79601 Pain in right arm: Secondary | ICD-10-CM

## 2022-05-07 NOTE — Progress Notes (Signed)
Acute Office Visit  Subjective:     Patient ID: Candice Bennett, female    DOB: May 05, 1946, 76 y.o.   MRN: [id number]  Chief Complaint  Patient presents with   Arm concern    Right arm weakness and pain. Pt states unable to apply pressure with right arm. Pt states having a fall back in August and landing on her right side    HPI Patient is in today for right upper arm pain.   Onset: first noticed 3-4 days ago; fell in August on her right side but no injuries at that time Location: right upper arm, "in the meat of my arm" Duration: intermittent (early in the mornings, worse with activities)  Characteristics: soreness, weakness Aggravating factors: reaching across her body, lifting Alleviating factors: nothing Radiating/associated symptoms: no numbness/tingling, swelling, rashes, warmth Timing: usually doesn't think about it during the day, hurts with lying on it at night, and first thing in the morning when showering Severity: 2/10     ROS All review of systems negative except what is listed in the HPI      Objective:    BP 126/82 (BP Location: Left Arm, Patient Position: Sitting, Cuff Size: Normal)   Pulse 82   Temp 97.9 F (36.6 C) (Oral)   Resp 18   Ht 5' 2.5" (1.588 m)   Wt 200 lb 9.6 oz (91 kg)   SpO2 99%   BMI 36.11 kg/m    Physical Exam Vitals reviewed.  Constitutional:      Appearance: Normal appearance.  Musculoskeletal:        General: No swelling, tenderness or deformity. Normal range of motion.     Comments: Right shoulder: no pain on palpation, negative Hawkins, Neers, full ROM Normal/symmetrical grip strength and elbow ROM No tenderness to palpation of deltoid, biceps, triceps, elbow   Skin:    General: Skin is warm and dry.     Capillary Refill: Capillary refill takes less than 2 seconds.     Findings: No bruising, erythema or lesion.  Neurological:     General: No focal deficit present.     Mental Status: She is alert and oriented to  person, place, and time. Mental status is at baseline.     Cranial Nerves: No cranial nerve deficit.     Sensory: No sensory deficit.     Motor: No weakness.     Coordination: Coordination normal.     Gait: Gait normal.  Psychiatric:        Mood and Affect: Mood normal.        Behavior: Behavior normal.        Thought Content: Thought content normal.        Judgment: Judgment normal.     No results found for any visits on 05/07/22.      Assessment & Plan:   Problem List Items Addressed This Visit   None Visit Diagnoses     Right arm pain    -  Primary No alarm findings on exam today. Normal neuro exam and shoulder exam.  Let's start conservatively with rest, heating pad, gentle stretches, (ibuprofen or Aleve) for a week or so.  If not making any improvement, refer to sports medicine.  We discussed warning symptoms to monitor for and when to seek emergent care.         No orders of the defined types were placed in this encounter.   Return if symptoms worsen or fail to improve.  Candice Nails  Olevia Bowens, NP

## 2022-05-07 NOTE — Patient Instructions (Signed)
No alarm findings on exam today Let's start conservatively with rest, heating pad, gentle stretches, (ibuprofen or Aleve) for a week or so.  If not making any improvement, refer to sports medicine.  We discussed warning symptoms to monitor for and when to seek emergent care.

## 2022-05-30 ENCOUNTER — Other Ambulatory Visit: Payer: Self-pay | Admitting: Family Medicine

## 2022-05-30 DIAGNOSIS — I10 Essential (primary) hypertension: Secondary | ICD-10-CM

## 2022-07-15 ENCOUNTER — Emergency Department (HOSPITAL_COMMUNITY)
Admission: EM | Admit: 2022-07-15 | Discharge: 2022-07-15 | Disposition: A | Discharge status: Home / Self Care | Payer: Worker's Compensation | Attending: Emergency Medicine | Admitting: Emergency Medicine

## 2022-07-15 ENCOUNTER — Emergency Department (HOSPITAL_COMMUNITY): Payer: Worker's Compensation

## 2022-07-15 DIAGNOSIS — S0093XA Contusion of unspecified part of head, initial encounter: Secondary | ICD-10-CM

## 2022-07-15 DIAGNOSIS — W01190A Fall on same level from slipping, tripping and stumbling with subsequent striking against furniture, initial encounter: Secondary | ICD-10-CM | POA: Insufficient documentation

## 2022-07-15 DIAGNOSIS — S0990XA Unspecified injury of head, initial encounter: Secondary | ICD-10-CM | POA: Diagnosis present

## 2022-07-15 DIAGNOSIS — Z7982 Long term (current) use of aspirin: Secondary | ICD-10-CM | POA: Insufficient documentation

## 2022-07-15 DIAGNOSIS — Z79899 Other long term (current) drug therapy: Secondary | ICD-10-CM | POA: Insufficient documentation

## 2022-07-15 DIAGNOSIS — I10 Essential (primary) hypertension: Secondary | ICD-10-CM | POA: Diagnosis not present

## 2022-07-15 DIAGNOSIS — Z8572 Personal history of non-Hodgkin lymphomas: Secondary | ICD-10-CM | POA: Insufficient documentation

## 2022-07-15 DIAGNOSIS — W19XXXA Unspecified fall, initial encounter: Secondary | ICD-10-CM | POA: Diagnosis not present

## 2022-07-15 DIAGNOSIS — S0003XA Contusion of scalp, initial encounter: Secondary | ICD-10-CM | POA: Diagnosis not present

## 2022-07-15 DIAGNOSIS — S199XXA Unspecified injury of neck, initial encounter: Secondary | ICD-10-CM | POA: Diagnosis not present

## 2022-07-15 NOTE — ED Provider Notes (Signed)
[name]   CSN: [id number] Arrival date & time: 07/15/22  1053     History  No chief complaint on file.   Candice Bennett is a 77 y.o. female.  Pt is a 77 yo female with a pmhx significant for htn, follicular lymphoma, sarcoidosis, and osa.  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  She went to sit on a stool and it fell out from under her.  She fell back, hitting her head.  She does have a hematoma to her head.  She is not on blood thinners.  She is slightly hypertensive, but has not taken her meds yet.  She took her bm med after I told her it was ok.  She had a little pain in her left hand, but that is better now.        Home Medications Prior to Admission medications   Medication Sig Start Date End Date Taking? Authorizing Provider  amLODipine (NORVASC) 5 MG tablet Take 1 tablet (5 mg total) by mouth daily. 06/01/22   [name], DO  aspirin EC 81 MG tablet Take 81 mg by mouth daily. Swallow whole.    [provider]  labetalol (NORMODYNE) 100 MG tablet Take 1 tablet by mouth twice daily 06/01/22   Carollee Herter, Yvonne R, DO  mometasone (ELOCON) 0.1 % cream APPLY  CREAM TOPICALLY TO AFFECTED AREA(S) ONCE DAILY Patient taking differently: daily as needed. APPLY  CREAM TOPICALLY TO AFFECTED AREA(S) ONCE DAILY 11/22/20   Carollee Herter, Alferd Apa, DO      Allergies    Iohexol and Ivp dye [iodinated contrast media]    Review of Systems   Review of Systems  Neurological:  Positive for headaches.  All other systems reviewed and are negative.   Physical Exam Updated Vital Signs BP 126/72   Pulse 79   Temp 98.2 F (36.8 C) (Oral)   Resp 16   SpO2 98%  Physical Exam Vitals and nursing note reviewed.  Constitutional:      Appearance: Normal appearance.  HENT:     Head: Normocephalic.      Right Ear: External ear normal.     Left Ear: External ear normal.     Nose: Nose normal.      Mouth/Throat:     Mouth: Mucous membranes are moist.     Pharynx: Oropharynx is clear.  Eyes:     Extraocular Movements: Extraocular movements intact.     Conjunctiva/sclera: Conjunctivae normal.     Pupils: Pupils are equal, round, and reactive to light.  Cardiovascular:     Rate and Rhythm: Normal rate and regular rhythm.     Pulses: Normal pulses.     Heart sounds: Normal heart sounds.  Pulmonary:     Effort: Pulmonary effort is normal.     Breath sounds: Normal breath sounds.  Abdominal:     General: Abdomen is flat. Bowel sounds are normal.     Palpations: Abdomen is soft.  Musculoskeletal:        General: Normal range of motion.     Cervical back: Normal range of motion and neck supple.  Skin:    General: Skin is warm.     Capillary Refill: Capillary refill takes less than 2 seconds.  Neurological:     General: No focal deficit present.     Mental Status: She is alert and oriented to person, place, and time.  Psychiatric:  Mood and Affect: Mood normal.        Behavior: Behavior normal.     ED Results / Procedures / Treatments   Labs (all labs ordered are listed, but only abnormal results are displayed) Labs Reviewed - No data to display  EKG None  Radiology CT Head Wo Contrast  Result Date: 07/15/2022 CLINICAL DATA:  Head trauma, minor (Age >= 65y); Neck trauma (Age >= 65y) EXAM: CT HEAD WITHOUT CONTRAST CT CERVICAL SPINE WITHOUT CONTRAST TECHNIQUE: Multidetector CT imaging of the head and cervical spine was performed following the standard protocol without intravenous contrast. Multiplanar CT image reconstructions of the cervical spine were also generated. RADIATION DOSE REDUCTION: This exam was performed according to the departmental dose-optimization program which includes automated exposure control, adjustment of the mA and/or kV according to patient size and/or use of iterative reconstruction technique. COMPARISON:  None Available. FINDINGS: CT HEAD  FINDINGS Brain: No evidence of acute infarction, hemorrhage, hydrocephalus, extra-axial collection or mass lesion/mass effect. Vascular: Calcific atherosclerosis. Skull: No acute fracture.  Posterior left scalp contusion. Sinuses/Orbits: Clear sinuses.  No acute orbital findings. CT CERVICAL SPINE FINDINGS Alignment: No substantial sagittal subluxation. Skull base and vertebrae: Vertebral body heights are maintained. No evidence of acute fracture. Soft tissues and spinal canal: No prevertebral fluid or swelling. No visible canal hematoma. Disc levels:  Moderate multilevel degenerative change. Upper chest: Visualized lung apices are clear. IMPRESSION: 1. No evidence of acute intracranial or cervical spine abnormality. 2. Posterior left scalp contusion. Electronically Signed   By: Margaretha Sheffield M.D.   On: 07/15/2022 11:50   CT Cervical Spine Wo Contrast  Result Date: 07/15/2022 CLINICAL DATA:  Head trauma, minor (Age >= 65y); Neck trauma (Age >= 65y) EXAM: CT HEAD WITHOUT CONTRAST CT CERVICAL SPINE WITHOUT CONTRAST TECHNIQUE: Multidetector CT imaging of the head and cervical spine was performed following the standard protocol without intravenous contrast. Multiplanar CT image reconstructions of the cervical spine were also generated. RADIATION DOSE REDUCTION: This exam was performed according to the departmental dose-optimization program which includes automated exposure control, adjustment of the mA and/or kV according to patient size and/or use of iterative reconstruction technique. COMPARISON:  None Available. FINDINGS: CT HEAD FINDINGS Brain: No evidence of acute infarction, hemorrhage, hydrocephalus, extra-axial collection or mass lesion/mass effect. Vascular: Calcific atherosclerosis. Skull: No acute fracture.  Posterior left scalp contusion. Sinuses/Orbits: Clear sinuses.  No acute orbital findings. CT CERVICAL SPINE FINDINGS Alignment: No substantial sagittal subluxation. Skull base and vertebrae:  Vertebral body heights are maintained. No evidence of acute fracture. Soft tissues and spinal canal: No prevertebral fluid or swelling. No visible canal hematoma. Disc levels:  Moderate multilevel degenerative change. Upper chest: Visualized lung apices are clear. IMPRESSION: 1. No evidence of acute intracranial or cervical spine abnormality. 2. Posterior left scalp contusion. Electronically Signed   By: Margaretha Sheffield M.D.   On: 07/15/2022 11:50    Procedures Procedures    Medications Ordered in ED Medications - No data to display  ED Course/ Medical Decision Making/ A&P                             Medical Decision Making Amount and/or Complexity of Data Reviewed Radiology: ordered.   This patient presents to the ED for concern of fall, this involves an extensive number of treatment options, and is a complaint that carries with it a high risk of complications and morbidity.  The differential diagnosis includes multiple trauma  Co morbidities that complicate the patient evaluation  htn, follicular lymphoma, sarcoidosis, and osa   Additional history obtained:  Additional history obtained from epic chart review External records from outside source obtained and reviewed including EMS report  Imaging Studies ordered:  I ordered imaging studies including ct head/c-spine  I independently visualized and interpreted imaging which showed   No evidence of acute intracranial or cervical spine abnormality.  2. Posterior left scalp contusion.   I agree with the radiologist interpretation  Medicines ordered and prescription drug management:  I have reviewed the patients home medicines and have made adjustments as needed   Test Considered:  ct   Problem List / ED Course:  Traumatic cephalohematoma:  ct neg.  Pt is stable for d/c.  Return if worse.  F/u with pcp.   Reevaluation:  After the interventions noted above, I reevaluated the patient and found that they have  :improved   Social Determinants of Health:  Lives at home   Dispostion:  After consideration of the diagnostic results and the patients response to treatment, I feel that the patent would benefit from discharge with outpatient f/u.          Final Clinical Impression(s) / ED Diagnoses Final diagnoses:  Traumatic cephalohematoma, initial encounter    Rx / DC Orders ED Discharge Orders     None         Isla Pence, MD 07/15/22 1210

## 2022-07-15 NOTE — ED Notes (Signed)
Patient transported to CT 

## 2022-07-15 NOTE — ED Notes (Signed)
DC instructions reviewed with pt. Pt verbalized understanding.  PT DC.  

## 2022-07-15 NOTE — ED Triage Notes (Signed)
Pt BIB [facility] for fall. 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  She has a hematoma to back of head and takes 81 mg ASA daily for the last year.  Pt is A&O and ambulatory on arrival.   MES BP 178/96.  PT has hx of htn but has not had meds this AM.

## 2022-07-22 ENCOUNTER — Telehealth: Payer: Self-pay

## 2022-07-22 NOTE — Telephone Encounter (Signed)
     Patient  visit on 07/15/2022  at [facility]. [facility] was for Traumatic cephalohematoma, initial encounter.  Have you been able to follow up with your primary care physician? Patient has an appointment today with Workers Comp/Employee clinic with [facility].  The patient was or was not able to obtain any needed medicine or equipment. No medication prescribed.  Are there diet recommendations that you are having difficulty following? No  Patient expresses understanding of discharge instructions and education provided has no other needs at this time.    [place] Resource Care Guide   [email or web address]  ?? [phone]   Website: [email or web address]  [email or web address]

## 2022-08-07 ENCOUNTER — Ambulatory Visit: Payer: Medicare Other | Admitting: Adult Health

## 2022-08-07 ENCOUNTER — Ambulatory Visit (INDEPENDENT_AMBULATORY_CARE_PROVIDER_SITE_OTHER): Payer: Medicare Other | Admitting: Adult Health

## 2022-08-07 ENCOUNTER — Encounter: Payer: Self-pay | Admitting: Adult Health

## 2022-08-07 VITALS — BP 122/64 | HR 92 | Temp 98.1°F | Ht 62.5 in | Wt 197.6 lb

## 2022-08-07 DIAGNOSIS — J069 Acute upper respiratory infection, unspecified: Secondary | ICD-10-CM

## 2022-08-07 DIAGNOSIS — J029 Acute pharyngitis, unspecified: Secondary | ICD-10-CM

## 2022-08-07 DIAGNOSIS — R067 Sneezing: Secondary | ICD-10-CM

## 2022-08-07 DIAGNOSIS — G4733 Obstructive sleep apnea (adult) (pediatric): Secondary | ICD-10-CM | POA: Diagnosis not present

## 2022-08-07 DIAGNOSIS — R430 Anosmia: Secondary | ICD-10-CM | POA: Diagnosis not present

## 2022-08-07 LAB — POCT INFLUENZA A/B
Influenza A, POC: NEGATIVE
Influenza B, POC: NEGATIVE

## 2022-08-07 LAB — POC COVID19 BINAXNOW: SARS Coronavirus 2 Ag: NEGATIVE

## 2022-08-07 NOTE — Assessment & Plan Note (Signed)
Good control on CPAP.  Encouraged on increased daily usage.  Plan  Patient Instructions  Continue on CPAP At bedtime  , wear all night long for greater than 6hrs .  Do not drive if sleepy  Remain active and work on weight loss.   Claritin 29m daily for nasal drainage As needed   Saline nasal rinses Twice daily   Tylenol As needed   Fluids and rest  Mucinex DM Twice daily  As needed  cough/congestion .  Follow up in 3-4 months and As needed

## 2022-08-07 NOTE — Patient Instructions (Addendum)
Continue on CPAP At bedtime  , wear all night long for greater than 6hrs .  Do not drive if sleepy  Remain active and work on weight loss.   Claritin 2m daily for nasal drainage As needed   Saline nasal rinses Twice daily   Tylenol As needed   Fluids and rest  Mucinex DM Twice daily  As needed  cough/congestion .  Follow up in 3-4 months and As needed

## 2022-08-07 NOTE — Progress Notes (Signed)
$@Patientj$  ID: Candice Bennett, female    DOB: 1946-02-12, 77 y.o.   MRN: [id number]  Chief Complaint  Patient presents with   Follow-up    Referring provider: Carollee Herter, Alferd Apa, *  HPI: 77 year old female followed for sleep apnea. Recently diagnosed with sleep apnea in 2004.  Tried CPAP briefly but was unable to tolerate.  Seen in 2018 with sleep apnea and started back on CPAP but unable to tolerate.  Seen in October 2023 with repeat sleep study that showed moderate sleep apnea started back on CPAP  TEST/EVENTS :  Home sleep study March 25, 2022 showed moderate sleep apnea with AHI at 18.9/hour and SPO2 low at 81%.   Sleep study 07/2016 AHI 14/hr     08/07/2022 Follow up: OSA  Patient returns for a 7-monthfollow-up for sleep apnea.  Patient was recently seen back in our office to reestablish for sleep apnea.  She was set up for home sleep study was done March 25, 2022 that showed moderate sleep apnea.  Patient has significant symptom burden was started back on to CPAP.  Patient says she is trying to wear her CPAP each night.  Has been getting in about 4 to 5 hours each night.  CPAP download shows 87% compliance.  Daily average usage at 5 hours.  Patient is on auto CPAP 5 to 15 cm H2O.  AHI is 2.8/hour and a minimum mask leaks. 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  She complains of sore throat and loss of smell and nasal congestion and drainage and cough. Taking otc cold meds. No fever. Some body aches initially. No n/v.d no known sick contacts.  Covid and flu test negative today in office.     Allergies  Allergen Reactions   Iohexol Itching   Ivp Dye [Iodinated Contrast Media] Itching    Immunization History  Administered Date(s) Administered   Fluad Quad(high Dose 65+) 03/09/2019, 05/14/2020, 06/12/2021, 03/16/2022   Hepatitis B, ADULT 05/21/2020,  06/20/2020   Influenza Whole 03/26/2009   Influenza, High Dose Seasonal PF 02/25/2017, 05/16/2018   PFIZER(Purple Top)SARS-COV-2 Vaccination 11/29/2019, 12/24/2019   PPD Test 05/08/2020   Pneumococcal Conjugate-13 01/30/2014   Pneumococcal Polysaccharide-23 12/09/2012   Td 08/10/2000   Tdap 04/07/2011   Zoster, Live 02/10/2010    Past Medical History:  Diagnosis Date   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 90 tablet 1   aspirin EC 81 MG tablet Take 81 mg by mouth daily. Swallow whole.     labetalol (NORMODYNE) 100 MG tablet Take 1 tablet by mouth twice daily 180 tablet 1   mometasone (ELOCON) 0.1 % cream APPLY  CREAM TOPICALLY TO AFFECTED AREA(S) ONCE DAILY (Patient taking differently: daily as needed. APPLY  CREAM TOPICALLY TO AFFECTED AREA(S) ONCE DAILY) 45 g 1   No facility-administered medications prior to visit.     Review of Systems:   Constitutional:   No  weight loss, night sweats,  Fevers, chills, fatigue,  or  lassitude.  HEENT:   No headaches,  Difficulty swallowing,  Tooth/dental problems, or  Sore throat,                No sneezing, itching, ear ache,  +nasal congestion, post nasal drip,   CV:  No chest pain,  Orthopnea, PND, swelling in lower extremities, anasarca, dizziness, palpitations, syncope.   GI  No heartburn, indigestion, abdominal pain, nausea, vomiting, diarrhea, change in bowel habits, loss of appetite, bloody stools.   Resp: .  No chest wall deformity  Skin: no rash or lesions.  GU: no dysuria, change in color of urine, no urgency or frequency.  No flank pain, no hematuria   MS:  No  joint pain or swelling.  No decreased range of motion.  No back pain.    Physical Exam  BP 122/64 (BP Location: Left Arm, Patient Position: Sitting, Cuff Size: Normal)   Pulse 92   Temp 98.1 F (36.7 C) (Oral)   Ht 5' 2.5" (1.588 m)   Wt 197 lb 9.6 oz (89.6 kg)   SpO2 99% Comment: RA  BMI 35.57 kg/m   GEN: A/Ox3; pleasant , NAD, well nourished    HEENT:  [place]/AT,   NOSE-clear, THROAT-clear, no lesions, no postnasal drip or exudate noted.   NECK:  Supple w/ fair ROM; no JVD; normal carotid impulses w/o bruits; no thyromegaly or nodules palpated; no lymphadenopathy.    RESP  Clear  P & A; w/o, wheezes/ rales/ or rhonchi. no accessory muscle use, no dullness to percussion  CARD:  RRR, no m/r/g, no peripheral edema, pulses intact, no cyanosis or clubbing.  GI:   Soft & nt; nml bowel sounds; no organomegaly or masses detected.   Musco: Warm bil, no deformities or joint swelling noted.   Neuro: alert, no focal deficits noted.    Skin: Warm, no lesions or rashes    Lab Results:  CBC     BNP No results found for: "BNP"  ProBNP No results found for: "PROBNP"  Imaging: CT Head Wo Contrast  Result Date: 07/15/2022 CLINICAL DATA:  Head trauma, minor (Age >= 65y); Neck trauma (Age >= 65y) EXAM: CT HEAD WITHOUT CONTRAST CT CERVICAL SPINE WITHOUT CONTRAST TECHNIQUE: Multidetector CT imaging of the head and cervical spine was performed following the standard protocol without intravenous contrast. Multiplanar CT image reconstructions of the cervical spine were also generated. RADIATION DOSE REDUCTION: This exam was performed according to the departmental dose-optimization program which includes automated exposure control, adjustment of the mA and/or kV according to patient size and/or use of iterative reconstruction technique. COMPARISON:  None Available. FINDINGS: CT HEAD FINDINGS Brain: No evidence of acute infarction, hemorrhage, hydrocephalus, extra-axial collection or mass  lesion/mass effect. Vascular: Calcific atherosclerosis. Skull: No acute fracture.  Posterior left scalp contusion. Sinuses/Orbits: Clear sinuses.  No acute orbital findings. CT CERVICAL SPINE FINDINGS Alignment: No substantial sagittal subluxation. Skull base and vertebrae: Vertebral body heights are maintained. No evidence of acute fracture. Soft tissues and spinal canal: No prevertebral fluid or swelling. No visible canal hematoma. Disc levels:  Moderate multilevel degenerative change. Upper chest: Visualized lung apices are clear. IMPRESSION: 1. No evidence of acute intracranial or cervical spine abnormality. 2. Posterior left scalp contusion. Electronically Signed   By: Margaretha Sheffield M.D.   On: 07/15/2022 11:50   CT Cervical Spine Wo Contrast  Result Date: 07/15/2022 CLINICAL DATA:  Head trauma, minor (Age >= 65y); Neck trauma (Age >= 65y) EXAM: CT HEAD WITHOUT CONTRAST  CT CERVICAL SPINE WITHOUT CONTRAST TECHNIQUE: Multidetector CT imaging of the head and cervical spine was performed following the standard protocol without intravenous contrast. Multiplanar CT image reconstructions of the cervical spine were also generated. RADIATION DOSE REDUCTION: This exam was performed according to the departmental dose-optimization program which includes automated exposure control, adjustment of the mA and/or kV according to patient size and/or use of iterative reconstruction technique. COMPARISON:  None Available. FINDINGS: CT HEAD FINDINGS Brain: No evidence of acute infarction, hemorrhage, hydrocephalus, extra-axial collection or mass lesion/mass effect. Vascular: Calcific atherosclerosis. Skull: No acute fracture.  Posterior left scalp contusion. Sinuses/Orbits: Clear sinuses.  No acute orbital findings. CT CERVICAL SPINE FINDINGS Alignment: No substantial sagittal subluxation. Skull base and vertebrae: Vertebral body heights are maintained. No evidence of acute fracture. Soft tissues and spinal canal: No  prevertebral fluid or swelling. No visible canal hematoma. Disc levels:  Moderate multilevel degenerative change. Upper chest: Visualized lung apices are clear. IMPRESSION: 1. No evidence of acute intracranial or cervical spine abnormality. 2. Posterior left scalp contusion. Electronically Signed   By: Margaretha Sheffield M.D.   On: 07/15/2022 11:50          No data to display          No results found for: "NITRICOXIDE"      Assessment & Plan:   Obstructive sleep apnea Good control on CPAP.  Encouraged on increased daily usage.  Plan  Patient Instructions  Continue on CPAP At bedtime  , wear all night long for greater than 6hrs .  Do not drive if sleepy  Remain active and work on weight loss.   Claritin 13m daily for nasal drainage As needed   Saline nasal rinses Twice daily   Tylenol As needed   Fluids and rest  Mucinex DM Twice daily  As needed  cough/congestion .  Follow up in 3-4 months and As needed      URI (upper respiratory infection) Flu and covid test neg in office today  Supportive/sx care -if not improving will need further evaluation and treatment options. Please contact office for sooner follow up if symptoms do not improve or worsen or seek emergency care    Plan  Patient Instructions  Continue on CPAP At bedtime  , wear all night long for greater than 6hrs .  Do not drive if sleepy  Remain active and work on weight loss.   Claritin 18mdaily for nasal drainage As needed   Saline nasal rinses Twice daily   Tylenol As needed   Fluids and rest  Mucinex DM Twice daily  As needed  cough/congestion .  Follow up in 3-4 months and As needed        TaRexene EdisonNP 08/07/2022

## 2022-08-07 NOTE — Assessment & Plan Note (Signed)
Flu and covid test neg in office today  Supportive/sx care -if not improving will need further evaluation and treatment options. Please contact office for sooner follow up if symptoms do not improve or worsen or seek emergency care    Plan  Patient Instructions  Continue on CPAP At bedtime  , wear all night long for greater than 6hrs .  Do not drive if sleepy  Remain active and work on weight loss.   Claritin 28m daily for nasal drainage As needed   Saline nasal rinses Twice daily   Tylenol As needed   Fluids and rest  Mucinex DM Twice daily  As needed  cough/congestion .  Follow up in 3-4 months and As needed

## 2022-08-10 NOTE — Progress Notes (Signed)
Reviewed and agree with assessment/plan.   Chesley Mires, MD [facility] Pulmonary/Critical Care 08/10/2022, 11:03 AM Pager:  [phone]

## 2022-08-18 ENCOUNTER — Ambulatory Visit: Payer: Medicare Other | Admitting: Adult Health

## 2022-09-07 ENCOUNTER — Ambulatory Visit
Admission: RE | Admit: 2022-09-07 | Discharge: 2022-09-07 | Disposition: A | Discharge status: Home / Self Care | Payer: Medicare Other | Source: Ambulatory Visit | Attending: Family Medicine | Admitting: Family Medicine

## 2022-09-07 DIAGNOSIS — Z1231 Encounter for screening mammogram for malignant neoplasm of breast: Secondary | ICD-10-CM | POA: Diagnosis not present

## 2022-09-07 DIAGNOSIS — M8589 Other specified disorders of bone density and structure, multiple sites: Secondary | ICD-10-CM | POA: Diagnosis not present

## 2022-09-07 DIAGNOSIS — Z780 Asymptomatic menopausal state: Secondary | ICD-10-CM | POA: Diagnosis not present

## 2022-09-07 DIAGNOSIS — E2839 Other primary ovarian failure: Secondary | ICD-10-CM

## 2022-09-09 ENCOUNTER — Telehealth: Payer: Self-pay | Admitting: Family Medicine

## 2022-09-09 NOTE — Telephone Encounter (Signed)
Contacted [name] to schedule their annual wellness visit. Appointment made for 09/17/2022.  Sherol Dade; Care Guide [facility] Direct Dial: [phone]

## 2022-09-10 ENCOUNTER — Other Ambulatory Visit: Payer: Self-pay | Admitting: Family Medicine

## 2022-09-10 DIAGNOSIS — R928 Other abnormal and inconclusive findings on diagnostic imaging of breast: Secondary | ICD-10-CM

## 2022-09-14 ENCOUNTER — Encounter: Payer: Self-pay | Admitting: Family Medicine

## 2022-09-14 ENCOUNTER — Ambulatory Visit (INDEPENDENT_AMBULATORY_CARE_PROVIDER_SITE_OTHER): Payer: Medicare Other | Admitting: Family Medicine

## 2022-09-14 ENCOUNTER — Ambulatory Visit: Payer: Medicare Other | Admitting: Family Medicine

## 2022-09-14 VITALS — BP 120/70 | HR 84 | Temp 97.8°F | Resp 18 | Ht 62.5 in | Wt 195.8 lb

## 2022-09-14 DIAGNOSIS — E785 Hyperlipidemia, unspecified: Secondary | ICD-10-CM | POA: Diagnosis not present

## 2022-09-14 DIAGNOSIS — I10 Essential (primary) hypertension: Secondary | ICD-10-CM

## 2022-09-14 NOTE — Assessment & Plan Note (Signed)
Encourage heart healthy diet such as MIND or DASH diet, increase exercise, avoid trans fats, simple carbohydrates and processed foods, consider a krill or fish or flaxseed oil cap daily.  °

## 2022-09-14 NOTE — Assessment & Plan Note (Signed)
Well controlled, no changes to meds. Encouraged heart healthy diet such as the DASH diet and exercise as tolerated.  °

## 2022-09-14 NOTE — Progress Notes (Addendum)
Subjective:   By signing my name below, I, [name], attest that this documentation has been prepared under the direction and in the presence of [name], DO. 09/14/2022   Patient ID: Candice Bennett, female    DOB: [birth year], 77 y.o.   MRN: [id number]  Chief Complaint  Patient presents with   Hypertension   Follow-up    HPI Patient is in today for a follow-up appointment.   Fall  She went to sit back on a stool and fell and hit her head. She visited the ED on 07/15/2022 as a result of the fall. CT scan without contrast completed on 07/15/2022. Results are normal.   Toe pain She complains of pain in the left digitus minimus pedis that began two months ago. She has a corn between her digitus minimus pedis and 4th toe.   Sleep  She sleeps 4-5 hours per night. She uses her CPAP to manage it, but she has difficulty when using it.   Past Medical History:  Diagnosis Date   Follicular lymphoma (HCC)    OSA (obstructive sleep apnea)    Sarcoidosis    Unspecified essential hypertension     Past Surgical History:  Procedure Laterality Date   ABDOMINAL HYSTERECTOMY  1983   CARPAL TUNNEL RELEASE     LYMPH NODE BIOPSY     port a cath insertion  2005   PORT-A-CATH REMOVAL  2006   TUBAL LIGATION  1978    Family History  Problem Relation Age of Onset   Heart disease Mother    Lung cancer Father    Heart attack Father    Heart failure Sister    Heart disease Sister 8       chf   Dementia Sister    Kidney disease Sister    Frontotemporal dementia Sister    Hypertension Sister    Multiple myeloma Sister    COPD Sister    Heart attack Sister 67   Bell's palsy Sister    Transient ischemic attack Sister    Diabetes Sister    Prostate cancer Brother    Diabetes Brother    Colon cancer Neg Hx    Colon polyps Neg Hx    Esophageal cancer Neg Hx    Stomach cancer Neg Hx    Rectal cancer Neg Hx     Social History   Socioeconomic History   Marital  status: Divorced    Spouse name: Not on file   Number of children: 2   Years of education: Not on file   Highest education level: Bachelor's degree (e.g., BA, AB, BS)  Occupational History   Occupation: Therapist, 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[place]  Tobacco Use   Smoking status: Never   Smokeless tobacco: Never  Vaping Use   Vaping Use: Never used  Substance and Sexual Activity   Alcohol use: No   Drug use: No   Sexual activity: Not Currently    Partners: Male  Other Topics Concern   Not on file  Social History Narrative   No regularly exercise   Live with daughter and her daughter   Social Determinants of Health   Financial Resource Strain: Low Risk  (09/13/2022)   Overall Financial Resource Strain (CARDIA)    Difficulty of Paying Living Expenses: Not hard at all  Food Insecurity: No Food Insecurity (09/13/2022)   Hunger Vital Sign    Worried About Running Out of  Food in the Last Year: Never true    [place] in the Last Year: Never true  Transportation Needs: No Transportation Needs (09/13/2022)   PRAPARE - Hydrologist (Medical): No    Lack of Transportation (Non-Medical): No  Physical Activity: Unknown (09/13/2022)   Exercise Vital Sign    Days of Exercise per Week: 0 days    Minutes of Exercise per Session: Not on file  Stress: No Stress Concern Present (09/13/2022)   [place]    Feeling of Stress : Only a little  Social Connections: Moderately Integrated (09/13/2022)   Social Connection and Isolation Panel [NHANES]    Frequency of Communication with Friends and Family: Twice a week    Frequency of Social Gatherings with Friends and Family: Once a week    Attends Religious Services: More than 4 times per year    Active Member of [employer] or Organizations: Yes    Attends Music therapist: More than 4 times per year    Marital Status: Divorced   Intimate Partner Violence: Not At Risk (08/19/2021)   Humiliation, Afraid, Rape, and Kick questionnaire    Fear of Current or Ex-Partner: No    Emotionally Abused: No    Physically Abused: No    Sexually Abused: No    Outpatient Medications Prior to Visit  Medication Sig Dispense Refill   amLODipine (NORVASC) 5 MG tablet Take 1 tablet (5 mg total) by mouth daily. 90 tablet 1   aspirin EC 81 MG tablet Take 81 mg by mouth daily. Swallow whole.     labetalol (NORMODYNE) 100 MG tablet Take 1 tablet by mouth twice daily 180 tablet 1   mometasone (ELOCON) 0.1 % cream APPLY  CREAM TOPICALLY TO AFFECTED AREA(S) ONCE DAILY (Patient taking differently: daily as needed. APPLY  CREAM TOPICALLY TO AFFECTED AREA(S) ONCE DAILY) 45 g 1   No facility-administered medications prior to visit.    Allergies  Allergen Reactions   Iohexol Itching   Ivp Dye [Iodinated Contrast Media] Itching    Review of Systems  Constitutional:  Negative for fever and malaise/fatigue.  HENT:  Negative for congestion.   Eyes:  Negative for blurred vision.  Respiratory:  Negative for shortness of breath.   Cardiovascular:  Negative for chest pain, palpitations and leg swelling.  Gastrointestinal:  Negative for abdominal pain, blood in stool and nausea.  Genitourinary:  Negative for dysuria and frequency.  Musculoskeletal:  Negative for falls.  Skin:  Negative for rash.  Neurological:  Negative for dizziness, loss of consciousness and headaches.  Endo/Heme/Allergies:  Negative for environmental allergies.  Psychiatric/Behavioral:  Negative for depression. The patient is not nervous/anxious.        Objective:    Physical Exam Vitals and nursing note reviewed.  Constitutional:      General: She is not in acute distress.    Appearance: Normal appearance. She is well-developed.  HENT:     Head: Normocephalic and atraumatic.     Right Ear: External ear normal.     Left Ear: External ear normal.  Eyes:      Extraocular Movements: Extraocular movements intact.     Conjunctiva/sclera: Conjunctivae normal.     Pupils: Pupils are equal, round, and reactive to light.  Neck:     Thyroid: No thyromegaly.     Vascular: No carotid bruit or JVD.  Cardiovascular:     Rate and Rhythm: Normal  rate and regular rhythm.     Heart sounds: Normal heart sounds. No murmur heard.    No gallop.  Pulmonary:     Effort: Pulmonary effort is normal. No respiratory distress.     Breath sounds: Normal breath sounds. No wheezing or rales.  Chest:     Chest wall: No tenderness.  Musculoskeletal:     Cervical back: Normal range of motion and neck supple.  Skin:    General: Skin is warm.  Neurological:     General: No focal deficit present.     Mental Status: She is alert and oriented to person, place, and time.  Psychiatric:        Mood and Affect: Mood normal.        Judgment: Judgment normal.     BP 120/70 (BP Location: Left Arm, Patient Position: Sitting, Cuff Size: Normal)   Pulse 84   Temp 97.8 F (36.6 C) (Oral)   Resp 18   Ht 5' 2.5" (1.588 m)   Wt 195 lb 12.8 oz (88.8 kg)   SpO2 99%   BMI 35.24 kg/m  Wt Readings from Last 3 Encounters:  09/14/22 195 lb 12.8 oz (88.8 kg)  08/07/22 197 lb 9.6 oz (89.6 kg)  05/07/22 200 lb 9.6 oz (91 kg)       Assessment & Plan:  Hyperlipidemia LDL goal <100 Assessment & Plan: Encourage heart healthy diet such as MIND or DASH diet, increase exercise, avoid trans fats, simple carbohydrates and processed foods, consider a krill or fish or flaxseed oil cap daily.     Essential (primary) hypertension Assessment & Plan: Well controlled, no changes to meds. Encouraged heart healthy diet such as the DASH diet and exercise as tolerated.       IAnn Held, DO, personally preformed the services described in this documentation.  All medical record entries made by the scribe were at my direction and in my presence.  I have reviewed the chart and  discharge instructions (if applicable) and agree that the record reflects my personal performance and is accurate and complete. 09/14/2022  [name], DO   I,Kennedy C Lynn,acting as a scribe for [name], DO.,have documented all relevant documentation on the behalf of [name], DO,as directed by  [name], DO while in the presence of [name], DO.

## 2022-09-17 ENCOUNTER — Ambulatory Visit (INDEPENDENT_AMBULATORY_CARE_PROVIDER_SITE_OTHER): Payer: Medicare Other | Admitting: *Deleted

## 2022-09-17 VITALS — Ht 62.5 in | Wt 195.0 lb

## 2022-09-17 DIAGNOSIS — Z0000 Encounter for general adult medical examination without abnormal findings: Secondary | ICD-10-CM | POA: Diagnosis not present

## 2022-09-17 NOTE — Patient Instructions (Signed)
Candice Bennett , Thank you for taking time to come for your Medicare Wellness Visit. I appreciate your ongoing commitment to your health goals. Please review the following plan we discussed and let me know if I can assist you in the future.     This is a list of the screening recommended for you and due dates:  Health Maintenance  Topic Date Due   Zoster (Shingles) Vaccine (1 of 2) Never done   COVID-19 Vaccine (3 - Pfizer risk series) 01/21/2020   DTaP/Tdap/Td vaccine (3 - Td or Tdap) 04/06/2021   Mammogram  09/07/2023   Medicare Annual Wellness Visit  09/17/2023   DEXA scan (bone density measurement)  09/06/2024   Colon Cancer Screening  12/31/2031   Pneumonia Vaccine  Completed   Flu Shot  Completed   Hepatitis C Screening: USPSTF Recommendation to screen - Ages 18-79 yo.  Completed   HPV Vaccine  Aged Out    Next appointment: Follow up in one year for your annual wellness visit.   Preventive Care 38 Years and Older, Female Preventive care refers to lifestyle choices and visits with your health care provider that can promote health and wellness. What does preventive care include? A yearly physical exam. This is also called an annual well check. Dental exams once or twice a year. Routine eye exams. Ask your health care provider how often you should have your eyes checked. Personal lifestyle choices, including: Daily care of your teeth and gums. Regular physical activity. Eating a healthy diet. Avoiding tobacco and drug use. Limiting alcohol use. Practicing safe sex. Taking low-dose aspirin every day. Taking vitamin and mineral supplements as recommended by your health care provider. What happens during an annual well check? The services and screenings done by your health care provider during your annual well check will depend on your age, overall health, lifestyle risk factors, and family history of disease. Counseling  Your health care provider may ask you questions about  your: Alcohol use. Tobacco use. Drug use. Emotional well-being. Home and relationship well-being. Sexual activity. Eating habits. History of falls. Memory and ability to understand (cognition). Work and work Statistician. Reproductive health. Screening  You may have the following tests or measurements: Height, weight, and BMI. Blood pressure. Lipid and cholesterol levels. These may be checked every 5 years, or more frequently if you are over 14 years old. Skin check. Lung cancer screening. You may have this screening every year starting at age 6 if you have a 30-pack-year history of smoking and currently smoke or have quit within the past 15 years. Fecal occult blood test (FOBT) of the stool. You may have this test every year starting at age 26. Flexible sigmoidoscopy or colonoscopy. You may have a sigmoidoscopy every 5 years or a colonoscopy every 10 years starting at age 103. Hepatitis C blood test. Hepatitis B blood test. Sexually transmitted disease (STD) testing. Diabetes screening. This is done by checking your blood sugar (glucose) after you have not eaten for a while (fasting). You may have this done every 1-3 years. Bone density scan. This is done to screen for osteoporosis. You may have this done starting at age 24. Mammogram. This may be done every 1-2 years. Talk to your health care provider about how often you should have regular mammograms. Talk with your health care provider about your test results, treatment options, and if necessary, the need for more tests. Vaccines  Your health care provider may recommend certain vaccines, such as: Influenza vaccine. This  is recommended every year. Tetanus, diphtheria, and acellular pertussis (Tdap, Td) vaccine. You may need a Td booster every 10 years. Zoster vaccine. You may need this after age 85. Pneumococcal 13-valent conjugate (PCV13) vaccine. One dose is recommended after age 62. Pneumococcal polysaccharide (PPSV23) vaccine.  One dose is recommended after age 4. Talk to your health care provider about which screenings and vaccines you need and how often you need them. This information is not intended to replace advice given to you by your health care provider. Make sure you discuss any questions you have with your health care provider. Document Released: 07/05/2015 Document Revised: 02/26/2016 Document Reviewed: 04/09/2015 Elsevier Interactive Patient Education  2017 [employer] Prevention in the Home Falls can cause injuries. They can happen to people of all ages. There are many things you can do to make your home safe and to help prevent falls. What can I do on the outside of my home? Regularly fix the edges of walkways and driveways and fix any cracks. Remove anything that might make you trip as you walk through a door, such as a raised step or threshold. Trim any bushes or trees on the path to your home. Use bright outdoor lighting. Clear any walking paths of anything that might make someone trip, such as rocks or tools. Regularly check to see if handrails are loose or broken. Make sure that both sides of any steps have handrails. Any raised decks and porches should have guardrails on the edges. Have any leaves, snow, or ice cleared regularly. Use sand or salt on walking paths during winter. Clean up any spills in your garage right away. This includes oil or grease spills. What can I do in the bathroom? Use night lights. Install grab bars by the toilet and in the tub and shower. Do not use towel bars as grab bars. Use non-skid mats or decals in the tub or shower. If you need to sit down in the shower, use a plastic, non-slip stool. Keep the floor dry. Clean up any water that spills on the floor as soon as it happens. Remove soap buildup in the tub or shower regularly. Attach bath mats securely with double-sided non-slip rug tape. Do not have throw rugs and other things on the floor that can make  you trip. What can I do in the bedroom? Use night lights. Make sure that you have a light by your bed that is easy to reach. Do not use any sheets or blankets that are too big for your bed. They should not hang down onto the floor. Have a firm chair that has side arms. You can use this for support while you get dressed. Do not have throw rugs and other things on the floor that can make you trip. What can I do in the kitchen? Clean up any spills right away. Avoid walking on wet floors. Keep items that you use a lot in easy-to-reach places. If you need to reach something above you, use a strong step stool that has a grab bar. Keep electrical cords out of the way. Do not use floor polish or wax that makes floors slippery. If you must use wax, use non-skid floor wax. Do not have throw rugs and other things on the floor that can make you trip. What can I do with my stairs? Do not leave any items on the stairs. Make sure that there are handrails on both sides of the stairs and use them. Fix handrails that  are broken or loose. Make sure that handrails are as long as the stairways. Check any carpeting to make sure that it is firmly attached to the stairs. Fix any carpet that is loose or worn. Avoid having throw rugs at the top or bottom of the stairs. If you do have throw rugs, attach them to the floor with carpet tape. Make sure that you have a light switch at the top of the stairs and the bottom of the stairs. If you do not have them, ask someone to add them for you. What else can I do to help prevent falls? Wear shoes that: Do not have high heels. Have rubber bottoms. Are comfortable and fit you well. Are closed at the toe. Do not wear sandals. If you use a stepladder: Make sure that it is fully opened. Do not climb a closed stepladder. Make sure that both sides of the stepladder are locked into place. Ask someone to hold it for you, if possible. Clearly mark and make sure that you can  see: Any grab bars or handrails. First and last steps. Where the edge of each step is. Use tools that help you move around (mobility aids) if they are needed. These include: Canes. Walkers. Scooters. Crutches. Turn on the lights when you go into a dark area. Replace any light bulbs as soon as they burn out. Set up your furniture so you have a clear path. Avoid moving your furniture around. If any of your floors are uneven, fix them. If there are any pets around you, be aware of where they are. Review your medicines with your doctor. Some medicines can make you feel dizzy. This can increase your chance of falling. Ask your doctor what other things that you can do to help prevent falls. This information is not intended to replace advice given to you by your health care provider. Make sure you discuss any questions you have with your health care provider. Document Released: 04/04/2009 Document Revised: 11/14/2015 Document Reviewed: 07/13/2014 Elsevier Interactive Patient Education  2017 [employer].

## 2022-09-17 NOTE — Progress Notes (Signed)
Subjective:  Pt completed ADLs, Fall risk, and SDOH during e-check in on 09/16/22. Answers verified with pt.    Candice Bennett is a 77 y.o. female who presents for Medicare Annual (Subsequent) preventive examination.  I connected with  Candice Bennett on 09/17/22 by a audio enabled telemedicine application and verified that I am speaking with the correct person using two identifiers.  Patient Location: Home  Provider Location: Office/Clinic  I discussed the limitations of evaluation and management by telemedicine. The patient expressed understanding and agreed to proceed.   Review of Systems     Cardiac Risk Factors include: advanced age (>2men, >20 women);obesity (BMI >30kg/m2);dyslipidemia;hypertension     Objective:    Today's Vitals   09/17/22 1121  Weight: 195 lb (88.5 kg)  Height: 5' 2.5" (1.588 m)   Body mass index is 35.1 kg/m.     09/17/2022   11:05 AM 08/19/2021    4:00 PM 05/22/2019   10:18 AM 05/16/2018    9:44 AM 05/10/2017    8:40 AM 08/18/2016    8:59 PM 05/08/2016   10:30 AM  Advanced Directives  Does Patient Have a Medical Advance Directive? Yes Yes Yes Yes Yes Yes Yes  Type of Paramedic of [place];Living will Healthcare Power of [place] of [place];Living will [place];Living will [place];Living will [place];Living will  Does patient want to make changes to medical advance directive? No - Patient declined  No - Patient declined   No - Patient declined   Copy of [place] in Chart? Yes - validated most recent copy scanned in chart (See row information) Yes - validated most recent copy scanned in chart (See row information) Yes - validated most recent copy scanned in chart (See row information) Yes - validated most recent copy scanned in chart (See row information) Yes No - copy requested No - copy  requested    Current Medications (verified) Outpatient Encounter Medications as of 09/17/2022  Medication Sig   amLODipine (NORVASC) 5 MG tablet Take 1 tablet (5 mg total) by mouth daily.   aspirin EC 81 MG tablet Take 81 mg by mouth daily. Swallow whole.   labetalol (NORMODYNE) 100 MG tablet Take 1 tablet by mouth twice daily   mometasone (ELOCON) 0.1 % cream APPLY  CREAM TOPICALLY TO AFFECTED AREA(S) ONCE DAILY (Patient taking differently: daily as needed. APPLY  CREAM TOPICALLY TO AFFECTED AREA(S) ONCE DAILY)   No facility-administered encounter medications on file as of 09/17/2022.    Allergies (verified) Iohexol and Ivp dye [iodinated contrast media]   History: Past Medical History:  Diagnosis Date   Allergy 1976   Cataract    Follicular lymphoma (HCC)    OSA (obstructive sleep apnea)    Sarcoidosis    Sleep apnea 05/2003   Unspecified essential hypertension    Past Surgical History:  Procedure Laterality Date   ABDOMINAL HYSTERECTOMY  06/22/1981   ABDOMINAL HYSTERECTOMY  1984   CARPAL TUNNEL RELEASE     LYMPH NODE BIOPSY     port a cath insertion  06/23/2003   PORT-A-CATH REMOVAL  06/22/2004   TUBAL LIGATION  06/22/1976   Family History  Problem Relation Age of Onset   Heart disease Mother    Lung cancer Father    Heart attack Father    Heart failure Sister    Heart disease Sister 12  chf   Dementia Sister    Kidney disease Sister    Frontotemporal dementia Sister    Hypertension Sister    Multiple myeloma Sister    COPD Sister    Heart attack Sister 65   Bell's palsy Sister    Transient ischemic attack Sister    Diabetes Sister    Prostate cancer Brother    Diabetes Brother    Colon cancer Neg Hx    Colon polyps Neg Hx    Esophageal cancer Neg Hx    Stomach cancer Neg Hx    Rectal cancer Neg Hx    Social History   Socioeconomic History   Marital status: Divorced    Spouse name: Not on file   Number of children: 2   Years of education:  Not on file   Highest education level: Bachelor's degree (e.g., BA, AB, BS)  Occupational History   Occupation: Therapist, 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[place]  Tobacco Use   Smoking status: Never   Smokeless tobacco: Never  Vaping Use   Vaping Use: Never used  Substance and Sexual Activity   Alcohol use: No   Drug use: No   Sexual activity: Not Currently    Partners: Male  Other Topics Concern   Not on file  Social History Narrative   No regularly exercise   Live with daughter and her daughter   Social Determinants of Health   Financial Resource Strain: Low Risk  (09/16/2022)   Overall Financial Resource Strain (CARDIA)    Difficulty of Paying Living Expenses: Not hard at all  Food Insecurity: No Food Insecurity (09/16/2022)   Hunger Vital Sign    Worried About Running Out of Food in the Last Year: Never true    Ran Out of Food in the Last Year: Never true  Transportation Needs: No Transportation Needs (09/16/2022)   PRAPARE - Hydrologist (Medical): No    Lack of Transportation (Non-Medical): No  Physical Activity: Inactive (09/16/2022)   Exercise Vital Sign    Days of Exercise per Week: 0 days    Minutes of Exercise per Session: 0 min  Stress: No Stress Concern Present (09/16/2022)   [place]    Feeling of Stress : Not at all  Social Connections: Moderately Integrated (09/16/2022)   Social Connection and Isolation Panel [NHANES]    Frequency of Communication with Friends and Family: Twice a week    Frequency of Social Gatherings with Friends and Family: Once a week    Attends Religious Services: More than 4 times per year    Active Member of [employer] or Organizations: Yes    Attends Music therapist: More than 4 times per year    Marital Status: Divorced    Tobacco Counseling Counseling given: Not Answered   Clinical Intake:  Pre-visit  preparation completed: Yes  Pain : No/denies pain  Nutritional Risks: None Diabetes: No  How often do you need to have someone help you when you read instructions, pamphlets, or other written materials from your doctor or pharmacy?: 1 - Never   Activities of Daily Living    09/16/2022   11:13 PM  In your present state of health, do you have any difficulty performing the following activities:  Hearing? 0  Vision? 0  Difficulty concentrating or making decisions? 0  Walking or climbing stairs? 0  Dressing or bathing? 0  Doing errands, shopping? 0  Preparing Food and eating ? N  Using the Toilet? N  In the past six months, have you accidently leaked urine? Y  Comment when coughing  Do you have problems with loss of bowel control? Y  Managing your Medications? N  Managing your Finances? N  Housekeeping or managing your Housekeeping? N    Patient Care Team: Carollee Herter, Alferd Apa, DO as PCP - General Inda Castle, MD (Inactive) as Consulting Physician (Gastroenterology)  Indicate any recent Medical Services you may have received from other than [facility] providers in the past year (date may be approximate).     Assessment:   This is a routine wellness examination for Lizzeth.  Hearing/Vision screen No results found.  Dietary issues and exercise activities discussed: Current Exercise Habits: The patient does not participate in regular exercise at present, Exercise limited by: None identified   Goals Addressed   None    Depression Screen    09/17/2022   11:09 AM 09/14/2022    2:01 PM 08/19/2021    3:57 PM 11/22/2020    8:59 AM 05/22/2019   10:24 AM 05/16/2018    9:47 AM 05/16/2018    9:03 AM  PHQ 2/9 Scores  PHQ - 2 Score 0 0 0 0 0 0 0  PHQ- 9 Score       5    Fall Risk    09/16/2022   11:13 PM 09/14/2022    2:01 PM 08/19/2021    4:02 PM 11/22/2020    8:59 AM 05/22/2019   10:23 AM  Fall Risk   Falls in the past year? 1 0 0 0 0  Number falls in past yr: 1 0 0 0  0  Injury with Fall? 1 0 0 0 0  Risk for fall due to : History of fall(s)  Impaired vision;Impaired balance/gait    Follow up Falls evaluation completed Falls evaluation completed Falls prevention discussed Falls evaluation completed Education provided;Falls prevention discussed    FALL RISK PREVENTION PERTAINING TO THE HOME:  Any stairs in or around the home? Yes  If so, are there any without handrails? No  Home free of loose throw rugs in walkways, pet beds, electrical cords, etc? Yes  Adequate lighting in your home to reduce risk of falls? Yes   ASSISTIVE DEVICES UTILIZED TO PREVENT FALLS:  Life alert? No  Use of a cane, walker or w/c? No  Grab bars in the bathroom? No  Shower chair or bench in shower? No  Elevated toilet seat or a handicapped toilet? No   TIMED UP AND GO:  Was the test performed?  No, audio visit .    Cognitive Function:    05/10/2017    8:43 AM 05/08/2016   10:31 AM  MMSE - Mini Mental State Exam  Orientation to time 5 5  Orientation to Place 5 5  Registration 3 3  Attention/ Calculation 5 5  Recall 3 3  Language- name 2 objects 2 2  Language- repeat 1 1  Language- follow 3 step command 3 3  Language- read & follow direction 1 1  Write a sentence 1 1  Copy design 1 1  Total score 30 30        09/17/2022   11:13 AM 08/19/2021    4:04 PM  6CIT Screen  What Year? 0 points 0 points  What month? 0 points 0 points  What time? 0 points 0 points  Count back from 20  0 points 0 points  Months in reverse 0 points 0 points  Repeat phrase 0 points 0 points  Total Score 0 points 0 points    Immunizations Immunization History  Administered Date(s) Administered   Fluad Quad(high Dose 65+) 03/09/2019, 05/14/2020, 06/12/2021, 03/16/2022   Hepatitis B, ADULT 05/21/2020, 06/20/2020   Influenza Whole 03/26/2009   Influenza, High Dose Seasonal PF 02/25/2017, 05/16/2018   PFIZER(Purple Top)SARS-COV-2 Vaccination 11/29/2019, 12/24/2019   PPD Test  05/08/2020   Pneumococcal Conjugate-13 01/30/2014   Pneumococcal Polysaccharide-23 12/09/2012   Td 08/10/2000   Tdap 04/07/2011   Zoster, Live 02/10/2010    TDAP status: Due, Education has been provided regarding the importance of this vaccine. Advised may receive this vaccine at local pharmacy or Health Dept. Aware to provide a copy of the vaccination record if obtained from local pharmacy or Health Dept. Verbalized acceptance and understanding.  Flu Vaccine status: Up to date  Pneumococcal vaccine status: Up to date  Covid-19 vaccine status: Information provided on how to obtain vaccines.   Qualifies for Shingles Vaccine? Yes   Zostavax completed Yes   Shingrix Completed?: No.    Education has been provided regarding the importance of this vaccine. Patient has been advised to call insurance company to determine out of pocket expense if they have not yet received this vaccine. Advised may also receive vaccine at local pharmacy or Health Dept. Verbalized acceptance and understanding.  Screening Tests Health Maintenance  Topic Date Due   Zoster Vaccines- Shingrix (1 of 2) Never done   COVID-19 Vaccine (3 - Pfizer risk series) 01/21/2020   DTaP/Tdap/Td (3 - Td or Tdap) 04/06/2021   MAMMOGRAM  09/07/2023   Medicare Annual Wellness (AWV)  09/17/2023   DEXA SCAN  09/06/2024   COLONOSCOPY (Pts 45-28yrs Insurance coverage will need to be confirmed)  12/31/2031   Pneumonia Vaccine 32+ Years old  Completed   INFLUENZA VACCINE  Completed   Hepatitis C Screening  Completed   HPV VACCINES  Aged Out    Health Maintenance  Health Maintenance Due  Topic Date Due   Zoster Vaccines- Shingrix (1 of 2) Never done   COVID-19 Vaccine (3 - Pfizer risk series) 01/21/2020   DTaP/Tdap/Td (3 - Td or Tdap) 04/06/2021    Colorectal cancer screening: Type of screening: Colonoscopy. Completed 12/30/21. Repeat every 10 years  Mammogram status: Completed 09/07/22. Repeat every year  Bone Density  status: Completed 09/07/22. Results reflect: Bone density results: OSTEOPENIA. Repeat every 2 years.  Lung Cancer Screening: (Low Dose CT Chest recommended if Age 86-80 years, 30 pack-year currently smoking OR have quit w/in 15years.) does not qualify.   Additional Screening:  Hepatitis C Screening: does qualify; Completed 02/01/15  Vision Screening: Recommended annual ophthalmology exams for early detection of glaucoma and other disorders of the eye. Is the patient up to date with their annual eye exam?  No  Who is the provider or what is the name of the office in which the patient attends annual eye exams? [facility]. If pt is not established with a provider, would they like to be referred to a provider to establish care? No .   Dental Screening: Recommended annual dental exams for proper oral hygiene  Community Resource Referral / Chronic Care Management: CRR required this visit?  No   CCM required this visit?  No      Plan:     I have personally reviewed and noted the following in the patient's chart:   Medical and  social history Use of alcohol, tobacco or illicit drugs  Current medications and supplements including opioid prescriptions. Patient is not currently taking opioid prescriptions. Functional ability and status Nutritional status Physical activity Advanced directives List of other physicians Hospitalizations, surgeries, and ER visits in previous 12 months Vitals Screenings to include cognitive, depression, and falls Referrals and appointments  In addition, I have reviewed and discussed with patient certain preventive protocols, quality metrics, and best practice recommendations. A written personalized care plan for preventive services as well as general preventive health recommendations were provided to patient.   Due to this being a telephonic visit, the after visit summary with patients personalized plan was offered to patient via mail or my-chart.  Patient  would like to access on my-chart.   Beatris Ship, [place]   09/17/2022   Nurse Notes: None

## 2022-09-25 ENCOUNTER — Ambulatory Visit
Admission: RE | Admit: 2022-09-25 | Discharge: 2022-09-25 | Disposition: A | Discharge status: Home / Self Care | Payer: Medicare Other | Source: Ambulatory Visit | Attending: Family Medicine | Admitting: Family Medicine

## 2022-09-25 DIAGNOSIS — N6489 Other specified disorders of breast: Secondary | ICD-10-CM | POA: Diagnosis not present

## 2022-09-25 DIAGNOSIS — R928 Other abnormal and inconclusive findings on diagnostic imaging of breast: Secondary | ICD-10-CM

## 2022-09-25 DIAGNOSIS — R922 Inconclusive mammogram: Secondary | ICD-10-CM | POA: Diagnosis not present

## 2022-10-21 ENCOUNTER — Telehealth: Payer: Self-pay | Admitting: Family Medicine

## 2022-10-21 DIAGNOSIS — L309 Dermatitis, unspecified: Secondary | ICD-10-CM

## 2022-10-21 NOTE — Telephone Encounter (Signed)
Med not filled since 2022. Okay to send?

## 2022-10-21 NOTE — Telephone Encounter (Signed)
Prescription Request  10/21/2022  Is this a "Controlled Substance" medicine? No  LOV: 09/14/2022  What is the name of the medication or equipment?  mometasone (ELOCON) 0.1 % cream   Have you contacted your pharmacy to request a refill? No   Which pharmacy would you like this sent to?  [facility] [address] ([address]),  - [address] [place]) [place] [address] Phone: [phone] Fax: [phone]    Patient notified that their request is being sent to the clinical staff for review and that they should receive a response within 2 business days.   Please advise at Mobile [phone] (mobile)

## 2022-10-22 MED ORDER — MOMETASONE FUROATE 0.1 % EX CREA: TOPICAL_CREAM | CUTANEOUS | 1 refills | Status: DC

## 2022-10-22 NOTE — Telephone Encounter (Signed)
Refill sent.

## 2022-10-22 NOTE — Addendum Note (Signed)
Addended by: Roxanne Gates on: 10/22/2022 02:23 PM   Modules accepted: Orders

## 2022-11-18 DIAGNOSIS — H52223 Regular astigmatism, bilateral: Secondary | ICD-10-CM | POA: Diagnosis not present

## 2022-11-18 DIAGNOSIS — H43812 Vitreous degeneration, left eye: Secondary | ICD-10-CM | POA: Diagnosis not present

## 2022-11-18 DIAGNOSIS — H2513 Age-related nuclear cataract, bilateral: Secondary | ICD-10-CM | POA: Diagnosis not present

## 2022-11-18 DIAGNOSIS — H5213 Myopia, bilateral: Secondary | ICD-10-CM | POA: Diagnosis not present

## 2022-11-18 DIAGNOSIS — H35373 Puckering of macula, bilateral: Secondary | ICD-10-CM | POA: Diagnosis not present

## 2022-11-18 DIAGNOSIS — H524 Presbyopia: Secondary | ICD-10-CM | POA: Diagnosis not present

## 2022-11-26 ENCOUNTER — Telehealth: Payer: Self-pay | Admitting: Family Medicine

## 2022-11-26 NOTE — Telephone Encounter (Signed)
Patient has called stating that she has ben having on going right knee pain for about 6 months now. The patient has mentioned this problem to the provider before so she was wondering if she still needs an appointment or if the provider can make the referral still. I went ahead and made the pt an appointment for Tuesday 6.18.24 to see the provider but the pt said to inform her if she can cancel. Please advise.

## 2022-11-27 NOTE — Telephone Encounter (Signed)
Left detailed message that she will need to be seen and to keep appt on 12/08/22.

## 2022-11-28 ENCOUNTER — Other Ambulatory Visit: Payer: Self-pay | Admitting: Family Medicine

## 2022-11-28 DIAGNOSIS — I10 Essential (primary) hypertension: Secondary | ICD-10-CM

## 2022-12-08 ENCOUNTER — Ambulatory Visit (HOSPITAL_BASED_OUTPATIENT_CLINIC_OR_DEPARTMENT_OTHER)
Admission: RE | Admit: 2022-12-08 | Discharge: 2022-12-08 | Disposition: A | Discharge status: Home / Self Care | Payer: Medicare Other | Source: Ambulatory Visit | Attending: Family Medicine | Admitting: Family Medicine

## 2022-12-08 ENCOUNTER — Ambulatory Visit (INDEPENDENT_AMBULATORY_CARE_PROVIDER_SITE_OTHER): Payer: Medicare Other | Admitting: Family Medicine

## 2022-12-08 ENCOUNTER — Encounter: Payer: Self-pay | Admitting: Family Medicine

## 2022-12-08 VITALS — BP 110/70 | HR 79 | Temp 97.5°F | Resp 18 | Ht 62.5 in | Wt 196.8 lb

## 2022-12-08 DIAGNOSIS — M25561 Pain in right knee: Secondary | ICD-10-CM | POA: Diagnosis not present

## 2022-12-08 DIAGNOSIS — M1711 Unilateral primary osteoarthritis, right knee: Secondary | ICD-10-CM | POA: Diagnosis not present

## 2022-12-08 DIAGNOSIS — G8929 Other chronic pain: Secondary | ICD-10-CM | POA: Insufficient documentation

## 2022-12-08 DIAGNOSIS — M5441 Lumbago with sciatica, right side: Secondary | ICD-10-CM

## 2022-12-08 DIAGNOSIS — M5136 Other intervertebral disc degeneration, lumbar region: Secondary | ICD-10-CM | POA: Diagnosis not present

## 2022-12-08 DIAGNOSIS — M545 Low back pain, unspecified: Secondary | ICD-10-CM | POA: Diagnosis not present

## 2022-12-08 MED ORDER — DICLOFENAC SODIUM 1 % EX GEL: 4.0000 g | Freq: Four times a day (QID) | CUTANEOUS | 3 refills | Status: DC

## 2022-12-08 NOTE — Progress Notes (Signed)
Established Patient Office Visit  Subjective   Patient ID: Candice Bennett, female    DOB: [birth year]  Age: 77 y.o. MRN: [id number]  Chief Complaint  Patient presents with   Knee Pain    Right knee, 2 months, no new falls or injuries    HPI Discussed the use of AI scribe software for clinical note transcription with the patient, who gave verbal consent to proceed.  History of Present Illness   The patient, with a history of right knee arthroscopic surgery, presents with right knee pain that is worse when sitting down or standing up. They describe a 'crunching' noise in the knee, especially when using stairs. The pain has been ongoing for some time, and they have been managing it with over-the-counter pain relievers such as Advil and Tylenol, which provide some relief.  In addition to the knee pain, the patient has been experiencing a nerve sensation in their right foot and a tingling sensation in their right thigh. They deny any back pain but mention a previous episode of significant back pain after a period of heavy lifting.  The patient also reports a history of falls, including one where they fell forward and landed on their right side. They did not experience any immediate pain or discomfort after the fall, but they began to experience hip pain two months later. They also report a previous diagnosis of degenerative disc and facet joint disease from L2, L3.      Patient Active Problem List   Diagnosis Date Noted   URI (upper respiratory infection) 08/07/2022   Sore throat 12/08/2021   Dizziness 02/09/2021   Morbid obesity (HCC) 11/22/2020   Non-Hodgkin's lymphoma (HCC) 11/22/2020   Bronchitis 11/22/2020   Immunity status testing 05/14/2020   Vaginal itching 05/14/2020   Acute vaginitis 04/20/2019   Acute pain of left shoulder 04/20/2019   Breast pain, left 04/20/2019   Tinea corporis 04/20/2019   Left elbow tendonitis 11/15/2018   Hyperlipidemia LDL goal <100 08/27/2017    Aortic atherosclerosis (HCC) 04/06/2016   Neck fullness 03/28/2014   Obesity, Class II, BMI 35-39.9 01/30/2014   Pain of right thumb 01/30/2014   Stool incontinence 12/31/2010   HIP PAIN, RIGHT 02/10/2010   Low back pain 02/10/2010   Vitamin B 12 deficiency 04/19/2009   FATIGUE 03/26/2009   CONTACT DERMATITIS&OTHER ECZEMA DUE UNSPEC CAUSE 11/21/2008   POSTMENOPAUSAL SYNDROME 09/16/2007   RECTAL BLEEDING, HX OF 09/16/2007   ARTHROSCOPY, RIGHT KNEE, HX OF 09/16/2007   SARCOIDOSIS 06/01/2007   Non Hodgkin's lymphoma (HCC) 06/01/2007   Obstructive sleep apnea 11/19/2006   Essential (primary) hypertension 11/19/2006   Past Medical History:  Diagnosis Date   Allergy 1976   Cataract    Follicular lymphoma (HCC)    OSA (obstructive sleep apnea)    Sarcoidosis    Sleep apnea 05/2003   Unspecified essential hypertension    Past Surgical History:  Procedure Laterality Date   ABDOMINAL HYSTERECTOMY  06/22/1981   ABDOMINAL HYSTERECTOMY  1984   CARPAL TUNNEL RELEASE     LYMPH NODE BIOPSY     port a cath insertion  06/23/2003   PORT-A-CATH REMOVAL  06/22/2004   TUBAL LIGATION  06/22/1976   Social History   Tobacco Use   Smoking status: Never   Smokeless tobacco: Never  Vaping Use   Vaping Use: Never used  Substance Use Topics   Alcohol use: No   Drug use: No   Social History   Socioeconomic History  Marital status: Divorced    Spouse name: Not on file   Number of children: 2   Years of education: Not on file   Highest education level: Bachelor's degree (e.g., BA, AB, BS)  Occupational History   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  Other Topics Concern   Not on file  Social History Narrative   No regularly  exercise   Live with daughter and her daughter   Social Determinants of Health   Financial Resource Strain: Low Risk  (09/16/2022)   Overall Financial Resource Strain (CARDIA)    Difficulty of Paying Living Expenses: Not hard at all  Food Insecurity: No Food Insecurity (09/16/2022)   Hunger Vital Sign    Worried About Running Out of Food in the Last Year: Never true    Ran Out of Food in the Last Year: Never true  Transportation Needs: No Transportation Needs (09/16/2022)   PRAPARE - Administrator, Civil Service (Medical): No    Lack of Transportation (Non-Medical): No  Physical Activity: Inactive (09/16/2022)   Exercise Vital Sign    Days of Exercise per Week: 0 days    Minutes of Exercise per Session: 0 min  Stress: No Stress Concern Present (09/16/2022)   [employer] of Occupational Health - Occupational Stress Questionnaire    Feeling of Stress : Not at all  Social Connections: Moderately Integrated (09/16/2022)   Social Connection and Isolation Panel [NHANES]    Frequency of Communication with Friends and Family: Twice a week    Frequency of Social Gatherings with Friends and Family: Once a week    Attends Religious Services: More than 4 times per year    Active Member of [employer] or Organizations: Yes    Attends Banker Meetings: More than 4 times per year    Marital Status: Divorced  Intimate Partner Violence: Not At Risk (09/17/2022)   Humiliation, Afraid, Rape, and Kick questionnaire    Fear of Current or Ex-Partner: No    Emotionally Abused: No    Physically Abused: No    Sexually Abused: No   Family Status  Relation Name Status   Mother  Deceased   Father  Deceased   Sister Angeline Deceased   Sister Okey Dupre Alive   Sister Dois Davenport Deceased at age 66       covid    Sister Marisue Ivan Alive   Sister Bonita Quin Alive   Brother  Alive   Neg Hx  (Not Specified)   Family History  Problem Relation Age of Onset   Heart disease Mother    Lung cancer Father     Heart attack Father    Heart failure Sister    Heart disease Sister 17       chf   Dementia Sister    Kidney disease Sister    Frontotemporal dementia Sister    Hypertension Sister    Multiple myeloma Sister    COPD Sister    Heart attack Sister 38   Bell's palsy Sister    Transient ischemic attack Sister    Diabetes Sister    Prostate cancer Brother    Diabetes Brother  Colon cancer Neg Hx    Colon polyps Neg Hx    Esophageal cancer Neg Hx    Stomach cancer Neg Hx    Rectal cancer Neg Hx    Allergies  Allergen Reactions   Iohexol Itching   Ivp Dye [Iodinated Contrast Media] Itching      Review of Systems  Constitutional:  Negative for fever and malaise/fatigue.  HENT:  Negative for congestion.   Eyes:  Negative for blurred vision.  Respiratory:  Negative for cough and shortness of breath.   Cardiovascular:  Negative for chest pain, palpitations and leg swelling.  Gastrointestinal:  Negative for vomiting.  Musculoskeletal:  Positive for joint pain. Negative for back pain and falls.  Skin:  Negative for rash.  Neurological:  Negative for loss of consciousness and headaches.      Objective:     BP 110/70 (BP Location: Right Arm, Patient Position: Sitting, Cuff Size: Large)   Pulse 79   Temp (!) 97.5 F (36.4 C) (Oral)   Resp 18   Ht 5' 2.5" (1.588 m)   Wt 196 lb 12.8 oz (89.3 kg)   SpO2 96%   BMI 35.42 kg/m  BP Readings from Last 3 Encounters:  12/08/22 110/70  09/14/22 120/70  08/07/22 122/64   Wt Readings from Last 3 Encounters:  12/08/22 196 lb 12.8 oz (89.3 kg)  09/17/22 195 lb (88.5 kg)  09/14/22 195 lb 12.8 oz (88.8 kg)   SpO2 Readings from Last 3 Encounters:  12/08/22 96%  09/14/22 99%  08/07/22 99%      Physical Exam Vitals and nursing note reviewed.  Constitutional:      Appearance: She is well-developed.  HENT:     Head: Normocephalic and atraumatic.  Eyes:     Conjunctiva/sclera: Conjunctivae normal.  Neck:     Thyroid: No  thyromegaly.     Vascular: No carotid bruit or JVD.  Cardiovascular:     Rate and Rhythm: Normal rate and regular rhythm.     Heart sounds: Normal heart sounds. No murmur heard. Pulmonary:     Effort: Pulmonary effort is normal. No respiratory distress.     Breath sounds: Normal breath sounds. No wheezing or rales.  Chest:     Chest wall: No tenderness.  Musculoskeletal:        General: Swelling and tenderness present.     Cervical back: Normal range of motion and neck supple.     Lumbar back: No spasms. Decreased range of motion. Negative right straight leg raise test and negative left straight leg raise test.     Right knee: Swelling and crepitus present. No erythema or lacerations. Decreased range of motion. Tenderness present over the medial joint line and lateral joint line.  Neurological:     General: No focal deficit present.     Mental Status: She is alert and oriented to person, place, and time.     Deep Tendon Reflexes: Reflexes normal.     Comments: 4/5 strength R hip flexor  Otherwise strength normal b/l low ext   Psychiatric:        Mood and Affect: Mood normal.        Behavior: Behavior normal.        Thought Content: Thought content normal.        Judgment: Judgment normal.      No results found for any visits on 12/08/22.  Last CBC Lab Results  Component Value Date   WBC 6.0 03/16/2022   HGB  11.6 (L) 03/16/2022   HCT 36.3 03/16/2022   MCV 81.2 03/16/2022   MCH 24.9 (L) 02/07/2021   RDW 15.3 03/16/2022   PLT 336.0 03/16/2022   Last metabolic panel Lab Results  Component Value Date   GLUCOSE 100 (H) 03/16/2022   NA 140 03/16/2022   K 4.3 03/16/2022   CL 103 03/16/2022   CO2 30 03/16/2022   BUN 10 03/16/2022   CREATININE 0.88 03/16/2022   CALCIUM 9.2 03/16/2022   PROT 7.3 03/16/2022   ALBUMIN 4.1 03/16/2022   BILITOT 0.4 03/16/2022   ALKPHOS 67 03/16/2022   AST 14 03/16/2022   ALT 10 03/16/2022   Last lipids Lab Results  Component Value Date    CHOL 166 03/16/2022   HDL 59.90 03/16/2022   LDLCALC 87 03/16/2022   TRIG 94.0 03/16/2022   CHOLHDL 3 03/16/2022   Last hemoglobin A1c No results found for: "HGBA1C" Last thyroid functions Lab Results  Component Value Date   TSH 0.86 04/07/2011   Last vitamin D Lab Results  Component Value Date   VD25OH 29 (L) 06/04/2009   Last vitamin B12 and Folate Lab Results  Component Value Date   VITAMINB12 309 02/01/2015   FOLATE 10.6 11/06/2009      The 10-year ASCVD risk score (Arnett DK, et al., 2019) is: 10.8%    Assessment & Plan:   Problem List Items Addressed This Visit       Unprioritized   Low back pain   Relevant Orders   DG Lumbar Spine Complete   Other Visit Diagnoses     Chronic pain of right knee    -  Primary   Relevant Medications   diclofenac Sodium (VOLTAREN) 1 % GEL   Other Relevant Orders   Ambulatory referral to Orthopedics   DG Knee Complete 4 Views Right     Assessment and Plan    Right Knee Pain: Pain and crepitus with movement, particularly with sitting and standing. History of arthroscopic surgery 10-15 years ago. No instability reported. -Order knee X-ray to assess for arthritis or other structural abnormalities. -Prescribe Voltaren gel for topical pain relief. -Consider referral to orthopedics for possible injection therapy, pending X-ray results.  Lumbar Degenerative Disc Disease: History of degenerative disc and facet joint disease from L2-L3. Recent numbness and tingling in the lateral thigh and calf, possibly related to nerve impingement. -Order lumbar spine X-ray to assess for progression of degenerative changes. -Consider physical therapy for strengthening and pain management, pending X-ray results.  Follow-up: If no contact regarding orthopedic referral within two weeks, patient to notify office. X-ray results expected within 1-2 days.        No follow-ups on file.    Donato Schultz, DO

## 2022-12-17 ENCOUNTER — Ambulatory Visit: Payer: Medicare Other | Admitting: Orthopaedic Surgery

## 2022-12-28 NOTE — Progress Notes (Unsigned)
Office Visit Note   Patient: Candice Bennett           Date of Birth: 02-19-1946           MRN: [id number] Visit Date: 12/29/2022              Requested by: [address], [place], [place] [address] [place],  [place] [address] PCP: Zola Button, Grayling Congress, DO   Assessment & Plan: Visit Diagnoses:  1. Primary osteoarthritis of right knee     Plan: Delitha is a 77 year old female with medial sided right knee pain for 3 months.  Pain is worse with sit to stand.  Probable OA flare versus medial meniscus tear or combination of both.  Treatment options were given and she would like to try a cortisone injection today.  She will give this 6 weeks and if symptoms persist we will need to obtain an MRI.  She will message me on MyChart.  Follow-Up Instructions: No follow-ups on file.   Orders:  No orders of the defined types were placed in this encounter.  No orders of the defined types were placed in this encounter.     Procedures: Large Joint Inj: R knee on 12/29/2022 12:14 PM Indications: pain Details: 22 G needle  Arthrogram: No  Medications: 40 mg methylPREDNISolone acetate 40 MG/ML; 2 mL lidocaine 1 %; 2 mL bupivacaine 0.5 % Consent was given by the patient. Patient was prepped and draped in the usual sterile fashion.       Clinical Data: No additional findings.   Subjective: No chief complaint on file.   HPI [name] is a very pleasant 77 year old female here for evaluation of right knee pain for about 3 months.  She had arthroscopic right knee scope over 12 years ago.  Currently her pain is medial and feels like there is a tearing sensation.  Denies any mechanical symptoms.  She has had pain for about 3 months.  Denies any injuries or changes in activity. Review of Systems  Constitutional: Negative.   HENT: Negative.    Eyes: Negative.   Respiratory: Negative.    Cardiovascular: Negative.   Endocrine: Negative.   Musculoskeletal: Negative.    Neurological: Negative.   Hematological: Negative.   Psychiatric/Behavioral: Negative.    All other systems reviewed and are negative.   Objective: Vital Signs: There were no vitals taken for this visit.  Physical Exam Vitals and nursing note reviewed.  Constitutional:      Appearance: She is well-developed.  HENT:     Head: Atraumatic.     Nose: Nose normal.  Eyes:     Extraocular Movements: Extraocular movements intact.  Cardiovascular:     Pulses: Normal pulses.  Pulmonary:     Effort: Pulmonary effort is normal.  Abdominal:     Palpations: Abdomen is soft.  Musculoskeletal:     Cervical back: Neck supple.  Skin:    General: Skin is warm.     Capillary Refill: Capillary refill takes less than 2 seconds.  Neurological:     Mental Status: She is alert. Mental status is at baseline.  Psychiatric:        Behavior: Behavior normal.        Thought Content: Thought content normal.        Judgment: Judgment normal.   Ortho Exam Examination of the right knee shows medial joint line tenderness.  No joint effusion.  Negative McMurray.  Collaterals and cruciates are stable. Specialty  Comments:  No specialty comments available.  Imaging: No results found.   PMFS History: Patient Active Problem List   Diagnosis Date Noted   URI (upper respiratory infection) 08/07/2022   Sore throat 12/08/2021   Dizziness 02/09/2021   Morbid obesity (HCC) 11/22/2020   Non-Hodgkin's lymphoma (HCC) 11/22/2020   Bronchitis 11/22/2020   Immunity status testing 05/14/2020   Vaginal itching 05/14/2020   Acute vaginitis 04/20/2019   Acute pain of left shoulder 04/20/2019   Breast pain, left 04/20/2019   Tinea corporis 04/20/2019   Left elbow tendonitis 11/15/2018   Hyperlipidemia LDL goal <100 08/27/2017   Aortic atherosclerosis (HCC) 04/06/2016   Neck fullness 03/28/2014   Obesity, Class II, BMI 35-39.9 01/30/2014   Pain of right thumb 01/30/2014   Stool incontinence 12/31/2010    HIP PAIN, RIGHT 02/10/2010   Low back pain 02/10/2010   Vitamin B 12 deficiency 04/19/2009   FATIGUE 03/26/2009   CONTACT DERMATITIS&OTHER ECZEMA DUE UNSPEC CAUSE 11/21/2008   POSTMENOPAUSAL SYNDROME 09/16/2007   RECTAL BLEEDING, HX OF 09/16/2007   ARTHROSCOPY, RIGHT KNEE, HX OF 09/16/2007   SARCOIDOSIS 06/01/2007   Non Hodgkin's lymphoma (HCC) 06/01/2007   Obstructive sleep apnea 11/19/2006   Essential (primary) hypertension 11/19/2006   Past Medical History:  Diagnosis Date   Allergy 1976   Cataract    Follicular lymphoma (HCC)    OSA (obstructive sleep apnea)    Sarcoidosis    Sleep apnea 05/2003   Unspecified essential hypertension     Family History  Problem Relation Age of Onset   Heart disease Mother    Lung cancer Father    Heart attack Father    Heart failure Sister    Heart disease Sister 45       chf   Dementia Sister    Kidney disease Sister    Frontotemporal dementia Sister    Hypertension Sister    Multiple myeloma Sister    COPD Sister    Heart attack Sister 67   Bell's palsy Sister    Transient ischemic attack Sister    Diabetes Sister    Prostate cancer Brother    Diabetes Brother    Colon cancer Neg Hx    Colon polyps Neg Hx    Esophageal cancer Neg Hx    Stomach cancer Neg Hx    Rectal cancer Neg Hx     Past Surgical History:  Procedure Laterality Date   ABDOMINAL HYSTERECTOMY  06/22/1981   ABDOMINAL HYSTERECTOMY  1984   CARPAL TUNNEL RELEASE     LYMPH NODE BIOPSY     port a cath insertion  06/23/2003   PORT-A-CATH REMOVAL  06/22/2004   TUBAL LIGATION  06/22/1976   Social History   Occupational History   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

## 2022-12-29 ENCOUNTER — Ambulatory Visit (INDEPENDENT_AMBULATORY_CARE_PROVIDER_SITE_OTHER): Payer: Medicare Other | Admitting: Orthopaedic Surgery

## 2022-12-29 DIAGNOSIS — M1711 Unilateral primary osteoarthritis, right knee: Secondary | ICD-10-CM

## 2022-12-29 MED ORDER — LIDOCAINE HCL 1 % IJ SOLN
2.0000 mL | INTRAMUSCULAR | Status: AC | PRN
  Administered 2022-12-29: 2 mL

## 2022-12-29 MED ORDER — BUPIVACAINE HCL 0.5 % IJ SOLN
2.0000 mL | INTRAMUSCULAR | Status: AC | PRN
  Administered 2022-12-29: 2 mL via INTRA_ARTICULAR

## 2022-12-29 MED ORDER — METHYLPREDNISOLONE ACETATE 40 MG/ML IJ SUSP
40.0000 mg | INTRAMUSCULAR | Status: AC | PRN
  Administered 2022-12-29: 40 mg via INTRA_ARTICULAR

## 2023-01-16 NOTE — Progress Notes (Unsigned)
No SDOH insecurities was indicated.

## 2023-02-02 ENCOUNTER — Encounter: Payer: Self-pay | Admitting: *Deleted

## 2023-02-02 NOTE — Progress Notes (Signed)
Pt attended 01/16/23 screening event, where her b/p was 115/76. At the event, the pt confirmed her PCP was at [facility] and that she did not have any SDOH insecurities. Chart review indicates pt's PCP is Dr. Seabron Spates at [facility], where pt had her most recent OV on 12/08/22 (with an ortho referral to Dr. Roda Shutters, which she kept on 7/924.) Office visit notes indicate PCP is also f/u potential SDOH needs. No additional health equity team support indicated at that time.

## 2023-02-24 ENCOUNTER — Other Ambulatory Visit: Payer: Self-pay | Admitting: Family Medicine

## 2023-02-24 DIAGNOSIS — I10 Essential (primary) hypertension: Secondary | ICD-10-CM

## 2023-03-03 ENCOUNTER — Other Ambulatory Visit: Payer: Self-pay | Admitting: Family Medicine

## 2023-03-03 DIAGNOSIS — I10 Essential (primary) hypertension: Secondary | ICD-10-CM

## 2023-05-24 ENCOUNTER — Ambulatory Visit: Payer: Medicare Other | Admitting: Family Medicine

## 2023-05-26 ENCOUNTER — Other Ambulatory Visit: Payer: Self-pay | Admitting: Family Medicine

## 2023-05-26 DIAGNOSIS — I10 Essential (primary) hypertension: Secondary | ICD-10-CM

## 2023-06-18 ENCOUNTER — Other Ambulatory Visit: Payer: Self-pay | Admitting: Family Medicine

## 2023-06-18 DIAGNOSIS — I10 Essential (primary) hypertension: Secondary | ICD-10-CM

## 2023-08-02 ENCOUNTER — Ambulatory Visit: Payer: Self-pay | Admitting: Family Medicine

## 2023-08-02 NOTE — Telephone Encounter (Signed)
 Copied from CRM [id number]. Topic: Clinical - Red Word Triage >> Aug 02, 2023  8:32 AM Margarette Shawl wrote: Red Word that prompted transfer to Nurse Triage: Patient was calling in schedule appt due to injury to toes on right foot. While holding for Nurse Triage, patient had to disconnect due to being at work. Asked for call back at [facility] # [phone], asks if she does not answer if a detailed message could be left and she would call back. 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 Previous CRM sent was # [phone]

## 2023-08-02 NOTE — Telephone Encounter (Signed)
  Chief Complaint: right 4th toe pain Symptoms: 2/10 pain intermittent heel pain Frequency: toe pain ongoing 6 weeks; heel pain 3-4 days Pertinent Negatives: Patient denies fever or redness Disposition: [] ED /[] Urgent Care (no appt availability in office) / [x] Appointment(In office/virtual)/ []  [facility]/ [] Home Care/ [] Refused Recommended Disposition /[] [facility]/ []  Follow-up with PCP Additional Notes: The patient reported that 6 weeks ago she hit  her right foot on the metal leg of a folding table.  Since then, her 4th toe next to the pinky, has a small knot on the side and ongoing 2/10 pain.  At the time of the injury, the ball of her foot was hurting as well.  In the past 3-4 days, she noticed a 2/10 intermittent pain in her heel that caused her to limp yesterday.  She is requesting a follow up with her pcp.  She was scheduled for a next day appointment for further assessment.     Reason for Disposition  [1] MODERATE pain (e.g., interferes with normal activities, limping) AND [2] present > 3 days  Answer Assessment - Initial Assessment Questions 1. ONSET: "When did the pain start?"      3-4 days intermittent heel pain 2. LOCATION: "Where is the pain located?"      Heel and 4th toe  3. PAIN: "How bad is the pain?"    (Scale 1-10; or mild, moderate, severe)  - MILD (1-3): doesn't interfere with normal activities.   - MODERATE (4-7): interferes with normal activities (e.g., work or school) or awakens from sleep, limping.   - SEVERE (8-10): excruciating pain, unable to do any normal activities, unable to walk.      4th toe on the side there is a small knot 2/10 burning sensation; something feels wrong when bending  Heel - 2/10 seconds at time  5. CAUSE: "What do you think is causing the foot pain?"     Hit foot on metal leg of folding table - 6 weeks ago  6. OTHER SYMPTOMS: "Do you have any other symptoms?" (e.g., leg pain, rash, fever, numbness)      None  Protocols used: Foot Pain-A-AH

## 2023-08-02 NOTE — Telephone Encounter (Signed)
 Copied from CRM [id number]. Topic: Clinical - Red Word Triage >> Aug 02, 2023  8:17 AM Candice Bennett wrote: Red Word that prompted transfer to Nurse Triage: Hit right foot and began having pain in last 3 toe on 6 weeks ago on the edge of table.  Has burning sensation now in foot, would become agitated with touch of certain materials.  Bump appeared on the inside middle toe.  Soaked in Epsom salt and took pain meds.  Has spread to other parts of foot.  Intermittent pain in the heel and on ball of foot Intermittent pain in ankle.  Been ambulating fine but if puts foot in certain positions for a period of time

## 2023-08-03 ENCOUNTER — Other Ambulatory Visit: Payer: Self-pay | Admitting: Family Medicine

## 2023-08-03 ENCOUNTER — Ambulatory Visit (INDEPENDENT_AMBULATORY_CARE_PROVIDER_SITE_OTHER): Payer: Medicare Other | Admitting: Family Medicine

## 2023-08-03 VITALS — BP 118/80 | HR 84 | Temp 98.1°F | Resp 18 | Ht 62.5 in | Wt 199.2 lb

## 2023-08-03 DIAGNOSIS — I10 Essential (primary) hypertension: Secondary | ICD-10-CM

## 2023-08-03 DIAGNOSIS — M79674 Pain in right toe(s): Secondary | ICD-10-CM | POA: Diagnosis not present

## 2023-08-03 NOTE — Telephone Encounter (Signed)
Last Fill: 06/18/23  Last OV: 08/03/2023 Next OV: None  Routing to provider for review/authorization.

## 2023-08-03 NOTE — Progress Notes (Unsigned)
Established Patient Office Visit  Subjective   Patient ID: Candice Bennett, female    DOB: Dec 07, 1945  Age: 78 y.o. MRN: [id number]  Chief Complaint  Patient presents with   Foot Pain    Right foot, pt states hitting foot on a side table back in december     HPI Discussed the use of AI scribe software for clinical note transcription with the patient, who gave verbal consent to proceed.  History of Present Illness   Candice Bennett is a 78 year old female who presents with worsening pain and a lump in the third toe following trauma.  On December 20th, she injured her third toe by hitting it against a table leg. Initially, she soaked the toe in Epsom salt, which provided some relief, but the condition has since deteriorated.  She describes a 'little knot' and a 'puffy area' between and under the affected third toe. The pain occasionally radiates to the heel, causing intermittent sharp pain. The discomfort in the toe leads to limping, particularly after sitting for extended periods, such as when she was in her car doing paperwork.  She experiences a persistent burning sensation in the foot, even when not bearing weight, such as when lying in bed. No tingling is present. There is no bruising observed on the top of the foot, and she has not checked the bottom. The pain and burning began after the initial injury.  She has been wearing comfortable shoes to alleviate discomfort.      Patient Active Problem List   Diagnosis Date Noted   URI (upper respiratory infection) 08/07/2022   Sore throat 12/08/2021   Dizziness 02/09/2021   Morbid obesity (HCC) 11/22/2020   Non-Hodgkin's lymphoma (HCC) 11/22/2020   Bronchitis 11/22/2020   Immunity status testing 05/14/2020   Vaginal itching 05/14/2020   Acute vaginitis 04/20/2019   Acute pain of left shoulder 04/20/2019   Breast pain, left 04/20/2019   Tinea corporis 04/20/2019   Left elbow tendonitis 11/15/2018   Hyperlipidemia LDL goal  <100 08/27/2017   Aortic atherosclerosis (HCC) 04/06/2016   Neck fullness 03/28/2014   Obesity, Class II, BMI 35-39.9 01/30/2014   Pain of right thumb 01/30/2014   Stool incontinence 12/31/2010   HIP PAIN, RIGHT 02/10/2010   Low back pain 02/10/2010   Vitamin B 12 deficiency 04/19/2009   FATIGUE 03/26/2009   CONTACT DERMATITIS&OTHER ECZEMA DUE UNSPEC CAUSE 11/21/2008   POSTMENOPAUSAL SYNDROME 09/16/2007   RECTAL BLEEDING, HX OF 09/16/2007   ARTHROSCOPY, RIGHT KNEE, HX OF 09/16/2007   SARCOIDOSIS 06/01/2007   Non Hodgkin's lymphoma (HCC) 06/01/2007   Obstructive sleep apnea 11/19/2006   Essential (primary) hypertension 11/19/2006   Past Medical History:  Diagnosis Date   Allergy 1976   Cataract    Follicular lymphoma (HCC)    OSA (obstructive sleep apnea)    Sarcoidosis    Sleep apnea 05/2003   Unspecified essential hypertension    Past Surgical History:  Procedure Laterality Date   ABDOMINAL HYSTERECTOMY  06/22/1981   ABDOMINAL HYSTERECTOMY  1984   CARPAL TUNNEL RELEASE     LYMPH NODE BIOPSY     port a cath insertion  06/23/2003   PORT-A-CATH REMOVAL  06/22/2004   TUBAL LIGATION  06/22/1976   Social History   Tobacco Use   Smoking status: Never   Smokeless tobacco: Never  Vaping Use   Vaping status: Never Used  Substance Use Topics   Alcohol use: No   Drug use: No   Social  History   Socioeconomic History   Marital status: Divorced    Spouse name: Not on file   Number of children: 2   Years of education: Not on file   Highest education level: Bachelor's degree (e.g., BA, AB, BS)  Occupational History   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    Associate Professor: [employer]  Tobacco Use   Smoking status: Never   Smokeless tobacco: Never  Vaping Use   Vaping status: Never Used  Substance and Sexual Activity   Alcohol use: No   Drug use: No   Sexual activity: Not Currently    Partners: Male  Other Topics Concern   Not on file  Social History  Narrative   No regularly exercise   Live with daughter and her daughter   Social Drivers of Corporate investment banker Strain: Low Risk  (08/03/2023)   Overall Financial Resource Strain (CARDIA)    Difficulty of Paying Living Expenses: Not hard at all  Food Insecurity: No Food Insecurity (08/03/2023)   Hunger Vital Sign    Worried About Running Out of Food in the Last Year: Never true    Ran Out of Food in the Last Year: Never true  Transportation Needs: No Transportation Needs (08/03/2023)   PRAPARE - Administrator, Civil Service (Medical): No    Lack of Transportation (Non-Medical): No  Physical Activity: Insufficiently Active (08/03/2023)   Exercise Vital Sign    Days of Exercise per Week: 1 day    Minutes of Exercise per Session: 10 min  Stress: No Stress Concern Present (08/03/2023)   [employer] of Occupational Health - Occupational Stress Questionnaire    Feeling of Stress : Not at all  Social Connections: Moderately Integrated (08/03/2023)   Social Connection and Isolation Panel [NHANES]    Frequency of Communication with Friends and Family: Twice a week    Frequency of Social Gatherings with Friends and Family: Once a week    Attends Religious Services: More than 4 times per year    Active Member of [employer] or Organizations: Yes    Attends Banker Meetings: More than 4 times per year    Marital Status: Divorced  Intimate Partner Violence: Not At Risk (01/16/2023)   Humiliation, Afraid, Rape, and Kick questionnaire    Fear of Current or Ex-Partner: No    Emotionally Abused: No    Physically Abused: No    Sexually Abused: No   Family Status  Relation Name Status   Mother  Deceased   Father  Deceased   Sister Angeline Deceased   Sister Okey Dupre Alive   Sister Dois Davenport Deceased at age 58       covid    Sister Marisue Ivan Alive   Sister Bonita Quin Alive   Brother  Alive   Neg Hx  (Not Specified)  No partnership data on file   Family History  Problem  Relation Age of Onset   Heart disease Mother    Lung cancer Father    Heart attack Father    Heart failure Sister    Heart disease Sister 8       chf   Dementia Sister    Kidney disease Sister    Frontotemporal dementia Sister    Hypertension Sister    Multiple myeloma Sister    COPD Sister    Heart attack Sister 71   Bell's palsy Sister    Transient ischemic attack Sister    Diabetes Sister  Prostate cancer Brother    Diabetes Brother    Colon cancer Neg Hx    Colon polyps Neg Hx    Esophageal cancer Neg Hx    Stomach cancer Neg Hx    Rectal cancer Neg Hx    Allergies  Allergen Reactions   Iohexol Itching   Ivp Dye [Iodinated Contrast Media] Itching      Review of Systems  Constitutional:  Negative for chills, fever and malaise/fatigue.  HENT:  Negative for congestion and hearing loss.   Eyes:  Negative for blurred vision and discharge.  Respiratory:  Negative for cough, sputum production and shortness of breath.   Cardiovascular:  Negative for chest pain, palpitations and leg swelling.  Gastrointestinal:  Negative for abdominal pain, blood in stool, constipation, diarrhea, heartburn, nausea and vomiting.  Genitourinary:  Negative for dysuria, frequency, hematuria and urgency.  Musculoskeletal:  Positive for joint pain. Negative for back pain, falls and myalgias.  Skin:  Negative for rash.  Neurological:  Negative for dizziness, sensory change, loss of consciousness, weakness and headaches.  Endo/Heme/Allergies:  Negative for environmental allergies. Does not bruise/bleed easily.  Psychiatric/Behavioral:  Negative for depression and suicidal ideas. The patient is not nervous/anxious and does not have insomnia.       Objective:     BP 118/80 (BP Location: Right Arm, Patient Position: Sitting, Cuff Size: Large)   Pulse 84   Temp 98.1 F (36.7 C) (Oral)   Resp 18   Ht 5' 2.5" (1.588 m)   Wt 199 lb 3.2 oz (90.4 kg)   SpO2 99%   BMI 35.85 kg/m  BP Readings  from Last 3 Encounters:  08/03/23 118/80  01/16/23 115/76  12/08/22 110/70   Wt Readings from Last 3 Encounters:  08/03/23 199 lb 3.2 oz (90.4 kg)  12/08/22 196 lb 12.8 oz (89.3 kg)  09/17/22 195 lb (88.5 kg)   SpO2 Readings from Last 3 Encounters:  08/03/23 99%  12/08/22 96%  09/14/22 99%      Physical Exam Vitals and nursing note reviewed.  Constitutional:      General: She is not in acute distress.    Appearance: Normal appearance. She is well-developed.  HENT:     Head: Normocephalic and atraumatic.  Eyes:     General: No scleral icterus.       Right eye: No discharge.        Left eye: No discharge.  Cardiovascular:     Rate and Rhythm: Normal rate and regular rhythm.     Heart sounds: No murmur heard. Pulmonary:     Effort: Pulmonary effort is normal. No respiratory distress.     Breath sounds: Normal breath sounds.  Musculoskeletal:        General: Normal range of motion.     Cervical back: Normal range of motion and neck supple.     Right lower leg: No edema.     Left lower leg: No edema.     Right foot: Tenderness present.     Comments: Pain along achilles tendon R heel Tenderness 3rd toe R foot   Skin:    General: Skin is warm and dry.  Neurological:     Mental Status: She is alert and oriented to person, place, and time.  Psychiatric:        Mood and Affect: Mood normal.        Behavior: Behavior normal.        Thought Content: Thought content normal.  Judgment: Judgment normal.      No results found for any visits on 08/03/23.  Last CBC Lab Results  Component Value Date   WBC 6.0 03/16/2022   HGB 11.6 (L) 03/16/2022   HCT 36.3 03/16/2022   MCV 81.2 03/16/2022   MCH 24.9 (L) 02/07/2021   RDW 15.3 03/16/2022   PLT 336.0 03/16/2022   Last metabolic panel Lab Results  Component Value Date   GLUCOSE 100 (H) 03/16/2022   NA 140 03/16/2022   K 4.3 03/16/2022   CL 103 03/16/2022   CO2 30 03/16/2022   BUN 10 03/16/2022   CREATININE  0.88 03/16/2022   GFR 64.67 03/16/2022   CALCIUM 9.2 03/16/2022   PROT 7.3 03/16/2022   ALBUMIN 4.1 03/16/2022   BILITOT 0.4 03/16/2022   ALKPHOS 67 03/16/2022   AST 14 03/16/2022   ALT 10 03/16/2022   Last lipids Lab Results  Component Value Date   CHOL 166 03/16/2022   HDL 59.90 03/16/2022   LDLCALC 87 03/16/2022   TRIG 94.0 03/16/2022   CHOLHDL 3 03/16/2022   Last hemoglobin A1c No results found for: "HGBA1C" Last thyroid functions Lab Results  Component Value Date   TSH 0.86 04/07/2011   Last vitamin D Lab Results  Component Value Date   VD25OH 29 (L) 06/04/2009   Last vitamin B12 and Folate Lab Results  Component Value Date   VITAMINB12 309 02/01/2015   FOLATE 10.6 11/06/2009      The 10-year ASCVD risk score (Arnett DK, et al., 2019) is: 12%    Assessment & Plan:   Problem List Items Addressed This Visit   None Visit Diagnoses       Pain of toe of right foot    -  Primary   Relevant Orders   Ambulatory referral to Orthopedic Surgery     Assessment and Plan    Toe Injury The third toe was injured on December 20th by hitting it against a table leg. Initial self-care with Epsom salt soaks led to temporary improvement, but symptoms have worsened. There is a palpable knot, intermittent pain, and a burning sensation in the foot, though no visible bruising. Pain causes limping, especially after inactivity. Differential diagnosis includes a toe fracture or soft tissue injury. Referred to Dr. Victorino Dike, an orthopedic foot specialist at [facility], for further evaluation and likely x-ray. Avoid buddy taping due to the time elapsed since the injury. Dr. Victorino Dike will be informed about the lump and all symptoms.  Heel Pain Intermittent sharp pain in the back of the heel may result from altered gait due to the toe injury. There is no consistent pattern. This issue will be included in the referral to Dr. Victorino Dike for a comprehensive foot evaluation to determine any  underlying pathology. Dr. Victorino Dike will be informed of the heel pain for potential diagnostic imaging and treatment.  General Health Maintenance Footwear comfort was discussed, with alternating between different types of shoes for proper support. Advised to wear supportive footwear to prevent further foot strain.  Follow-up An appointment with Dr. Victorino Dike at [facility] will be scheduled. Ensure Dr. Victorino Dike has access to notes and symptoms for a thorough assessment.        No follow-ups on file.    Donato Schultz, DO

## 2023-08-03 NOTE — Telephone Encounter (Signed)
Copied from CRM [id number]. Topic: Clinical - Medication Refill >> Aug 03, 2023  5:03 PM Ernst Spell wrote: Most Recent Primary Care Visit:  Provider: Seabron Spates R  Department: [facility]  Visit Type: OFFICE VISIT  Date: 12/08/2022  Medication: labetalol  Has the patient contacted their pharmacy? Yes No more refills.  Is this the correct pharmacy for this prescription? Yes If no, delete pharmacy and type the correct one.  This is the patient's preferred pharmacy:  [facility] [address] ([address]), [place] - [address] [place] ([place]) [place] [address] Phone: [phone] Fax: [phone]   Has the prescription been filled recently? Yes  Is the patient out of the medication? Yes  Has the patient been seen for an appointment in the last year OR does the patient have an upcoming appointment? Yes  Can we respond through MyChart? Yes  Agent: Please be advised that Rx refills may take up to 3 business days. We ask that you follow-up with your pharmacy.

## 2023-08-16 ENCOUNTER — Ambulatory Visit: Payer: Self-pay | Admitting: Family Medicine

## 2023-08-16 ENCOUNTER — Ambulatory Visit: Payer: Medicare Other | Admitting: Family Medicine

## 2023-08-16 ENCOUNTER — Ambulatory Visit
Admission: EM | Admit: 2023-08-16 | Discharge: 2023-08-16 | Disposition: A | Discharge status: Home / Self Care | Payer: Medicare Other | Attending: Family Medicine | Admitting: Family Medicine

## 2023-08-16 DIAGNOSIS — R0789 Other chest pain: Secondary | ICD-10-CM | POA: Diagnosis not present

## 2023-08-16 DIAGNOSIS — J069 Acute upper respiratory infection, unspecified: Secondary | ICD-10-CM | POA: Diagnosis not present

## 2023-08-16 DIAGNOSIS — J040 Acute laryngitis: Secondary | ICD-10-CM

## 2023-08-16 DIAGNOSIS — B9689 Other specified bacterial agents as the cause of diseases classified elsewhere: Secondary | ICD-10-CM

## 2023-08-16 MED ORDER — CETIRIZINE HCL 10 MG PO TABS: 10.0000 mg | ORAL_TABLET | Freq: Every day | ORAL | 0 refills | Status: DC

## 2023-08-16 MED ORDER — AMOXICILLIN-POT CLAVULANATE 875-125 MG PO TABS: 1.0000 | ORAL_TABLET | Freq: Two times a day (BID) | ORAL | 0 refills | Status: DC

## 2023-08-16 MED ORDER — PREDNISONE 10 MG PO TABS: 40.0000 mg | ORAL_TABLET | Freq: Every day | ORAL | 0 refills | Status: DC

## 2023-08-16 MED ORDER — PROMETHAZINE-DM 6.25-15 MG/5ML PO SYRP: 2.5000 mL | ORAL_SOLUTION | Freq: Three times a day (TID) | ORAL | 0 refills | Status: DC | PRN

## 2023-08-16 NOTE — Telephone Encounter (Signed)
  Chief Complaint: Chest congestion - also mentions chest pain at the end of conversation Symptoms: Chest congestion  - bringing up small amount of clear phlegm. Pt also states she has chest pain below her left breast and back. Frequency: 1 week Pertinent Negatives: Patient denies fever Disposition: [] ED /[] Urgent Care (no appt availability in office) / [x] Appointment(In office/virtual)/ []  [facility]/ [] Home Care/ [] Refused Recommended Disposition /[] [facility]/ []  Follow-up with PCP Additional Notes: Pt states she has a URI that is mostly resolved, but wants to get checked out as she has a little "rattling" in her chest. Rattling has actually improved since last week. Pt also mentioned at the end of the conversation that she is having some chest pain below her left breast and her back.  Appt scheduled for this morning.    Copied from CRM [id number]. Topic: Clinical - Red Word Triage >> Aug 16, 2023  9:05 AM Kathryne Eriksson wrote: Red Word that prompted transfer to Nurse Triage: Rattling Sound In Chest >> Aug 16, 2023  9:08 AM Kathryne Eriksson wrote: Patient states she's been experiencing what she assumed to be a common cold for about a week now. Patient now has concerns with rattling sounds in her chest.  Reason for Disposition  Cough with cold symptoms (e.g., runny nose, postnasal drip, throat clearing)  Answer Assessment - Initial Assessment Questions 1. ONSET: "When did the cough begin?"      Last week - hearing rattling in chest 2. SEVERITY: "How bad is the cough today?"      Moderate 3. SPUTUM: "Describe the color of your sputum" (none, dry cough; clear, white, yellow, green)     Not really. Last was brown. Now it is clear. 4. HEMOPTYSIS: "Are you coughing up any blood?" If so ask: "How much?" (flecks, streaks, tablespoons, etc.)     no 5. DIFFICULTY BREATHING: "Are you having difficulty breathing?" If Yes, ask: "How bad is it?" (e.g., mild, moderate, severe)    -  MILD: No SOB at rest, mild SOB with walking, speaks normally in sentences, can lie down, no retractions, pulse < 100.    - MODERATE: SOB at rest, SOB with minimal exertion and prefers to sit, cannot lie down flat, speaks in phrases, mild retractions, audible wheezing, pulse 100-120.    - SEVERE: Very SOB at rest, speaks in single words, struggling to breathe, sitting hunched forward, retractions, pulse > 120      mild 6. FEVER: "Do you have a fever?" If Yes, ask: "What is your temperature, how was it measured, and when did it start?"     no 7. CARDIAC HISTORY: "Do you have any history of heart disease?" (e.g., heart attack, congestive heart failure)      No - had dizziness 2-3 years ago 8. LUNG HISTORY: "Do you have any history of lung disease?"  (e.g., pulmonary embolus, asthma, emphysema)     no 10. OTHER SYMPTOMS: "Do you have any other symptoms?" (e.g., runny nose, wheezing, chest pain)       Pt mentions some pain under her left breast and back that feels like a "gas bubble".  Protocols used: Cough - Acute Productive-A-AH

## 2023-08-16 NOTE — ED Provider Notes (Signed)
 [facility]  Note:  This document was prepared using Conservation officer, historic buildings and may include unintentional dictation errors.  MRN: [id number] DOB: [birth year]  Subjective:   Candice Bennett is a 78 y.o. female presenting for 10-day history of persistent sinus congestion, sinus drainage, throat pain and a dry hacking cough.  The cough does elicit left sided lateral chest wall pain.  No asthma.  No shortness of breath or wheezing.  No smoking.  Has a history of sarcoidosis.  Has also had bronchitis, asthmatic bronchitis.  Does not use an inhaler.  No current facility-administered medications for this encounter.  Current Outpatient Medications:    amLODipine (NORVASC) 5 MG tablet, Take 1 tablet by mouth once daily, Disp: 90 tablet, Rfl: 0   aspirin EC 81 MG tablet, Take 81 mg by mouth daily. Swallow whole., Disp: , Rfl:    diclofenac Sodium (VOLTAREN) 1 % GEL, Apply 4 g topically 4 (four) times daily., Disp: 50 g, Rfl: 3   labetalol (NORMODYNE) 100 MG tablet, TAKE 1 TABLET BY MOUTH TWICE DAILY APPT FOR FURTHER REFILLS, Disp: 60 tablet, Rfl: 0   mometasone (ELOCON) 0.1 % cream, APPLY  CREAM TOPICALLY TO AFFECTED AREA(S) ONCE DAILY, Disp: 45 g, Rfl: 1   Allergies  Allergen Reactions   Iohexol Itching   Ivp Dye [Iodinated Contrast Media] Itching    Past Medical History:  Diagnosis Date   Allergy 1976   Cataract    Follicular lymphoma (HCC)    OSA (obstructive sleep apnea)    Sarcoidosis    Sleep apnea 05/2003   Unspecified essential hypertension      Past Surgical History:  Procedure Laterality Date   ABDOMINAL HYSTERECTOMY  06/22/1981   ABDOMINAL HYSTERECTOMY  1984   CARPAL TUNNEL RELEASE     LYMPH NODE BIOPSY     port a cath insertion  06/23/2003   PORT-A-CATH REMOVAL  06/22/2004   TUBAL LIGATION  06/22/1976    Family History  Problem Relation Age of Onset   Heart disease Mother    Lung cancer Father    Heart attack Father    Heart  failure Sister    Heart disease Sister 36       chf   Dementia Sister    Kidney disease Sister    Frontotemporal dementia Sister    Hypertension Sister    Multiple myeloma Sister    COPD Sister    Heart attack Sister 70   Bell's palsy Sister    Transient ischemic attack Sister    Diabetes Sister    Prostate cancer Brother    Diabetes Brother    Colon cancer Neg Hx    Colon polyps Neg Hx    Esophageal cancer Neg Hx    Stomach cancer Neg Hx    Rectal cancer Neg Hx     Social History   Tobacco Use   Smoking status: Never   Smokeless tobacco: Never  Vaping Use   Vaping status: Never Used  Substance Use Topics   Alcohol use: No   Drug use: No    ROS   Objective:   Vitals: BP 126/74 (BP Location: Left Arm)   Pulse 64   Temp 98.3 F (36.8 C) (Oral)   Resp 20   SpO2 99%   Physical Exam Constitutional:      General: She is not in acute distress.    Appearance: Normal appearance. She is well-developed and normal weight. She is not ill-appearing,  toxic-appearing or diaphoretic.  HENT:     Head: Normocephalic and atraumatic.     Right Ear: Tympanic membrane, ear canal and external ear normal. No drainage or tenderness. No middle ear effusion. There is no impacted cerumen. Tympanic membrane is not erythematous or bulging.     Left Ear: Tympanic membrane, ear canal and external ear normal. No drainage or tenderness.  No middle ear effusion. There is no impacted cerumen. Tympanic membrane is not erythematous or bulging.     Nose: Congestion present. No rhinorrhea.     Mouth/Throat:     Mouth: Mucous membranes are moist. No oral lesions.     Pharynx: No pharyngeal swelling, oropharyngeal exudate, posterior oropharyngeal erythema or uvula swelling.     Tonsils: No tonsillar exudate or tonsillar abscesses.     Comments: Hoarseness of her voice noted. Eyes:     General: No scleral icterus.       Right eye: No discharge.        Left eye: No discharge.     Extraocular  Movements: Extraocular movements intact.     Right eye: Normal extraocular motion.     Left eye: Normal extraocular motion.     Conjunctiva/sclera: Conjunctivae normal.  Cardiovascular:     Rate and Rhythm: Normal rate and regular rhythm.     Heart sounds: Normal heart sounds. No murmur heard.    No friction rub. No gallop.  Pulmonary:     Effort: Pulmonary effort is normal. No respiratory distress.     Breath sounds: No stridor. No wheezing, rhonchi or rales.  Chest:     Chest wall: No tenderness.     Comments: No chest pain elicited on exam. Musculoskeletal:     Cervical back: Normal range of motion and neck supple.  Lymphadenopathy:     Cervical: No cervical adenopathy.  Skin:    General: Skin is warm and dry.  Neurological:     General: No focal deficit present.     Mental Status: She is alert and oriented to person, place, and time.  Psychiatric:        Mood and Affect: Mood normal.        Behavior: Behavior normal.     Assessment and Plan :   PDMP not reviewed this encounter.  1. Bacterial upper respiratory infection   2. Laryngitis   3. Acute chest wall pain      No active chest pain in clinic.  Offered imaging but patient declined.  I am in agreement given hemodynamically stable vital signs and clear pulmonary exam.  Creatinine clearance calculated at 36mL/min.  Recommend starting an antibiotic course with Augmentin to address bacterial upper respiratory infection.  Also recommend the use of prednisone for her laryngitis.  Counseled patient on potential for adverse effects with medications prescribed/recommended today, ER and return-to-clinic precautions discussed, patient verbalized understanding.    Wallis Bamberg, PA-C 08/16/23 [id number]

## 2023-08-16 NOTE — ED Triage Notes (Signed)
 Pt c/o cough, head/chest congestion, sore throat x 10 days-concerned that she started having intermittent pain to left rib/flank area-NAD-steady gait

## 2023-08-16 NOTE — Discharge Instructions (Signed)
 We will manage this as a bacterial upper respiratory infection with Augmentin. For sore throat or cough try using a honey-based tea. Use 3 teaspoons of honey with juice squeezed from half lemon. Place shaved pieces of ginger into 1/2-1 cup of water and warm over stove top. Then mix the ingredients and repeat every 4 hours as needed. Please take Tylenol 500mg -650mg  once every 6 hours for fevers, aches and pains. Hydrate very well with at least 2 liters (64 ounces) of water. Eat light meals such as soups (chicken and noodles, chicken wild rice, vegetable).  Do not eat any foods that you are allergic to.  Start an antihistamine like Zyrtec (10mg  daily) for postnasal drainage, sinus congestion.  You can take this together with prednisone and cough syrup.

## 2023-08-17 ENCOUNTER — Ambulatory Visit: Payer: Medicare Other | Admitting: Family Medicine

## 2023-08-24 ENCOUNTER — Other Ambulatory Visit: Payer: Self-pay | Admitting: Family Medicine

## 2023-08-24 DIAGNOSIS — I10 Essential (primary) hypertension: Secondary | ICD-10-CM

## 2023-08-28 ENCOUNTER — Other Ambulatory Visit: Payer: Self-pay | Admitting: Family Medicine

## 2023-08-28 DIAGNOSIS — I10 Essential (primary) hypertension: Secondary | ICD-10-CM

## 2023-09-15 DIAGNOSIS — S92511A Displaced fracture of proximal phalanx of right lesser toe(s), initial encounter for closed fracture: Secondary | ICD-10-CM | POA: Diagnosis not present

## 2023-09-15 DIAGNOSIS — M79671 Pain in right foot: Secondary | ICD-10-CM | POA: Diagnosis not present

## 2023-09-17 MED ORDER — CLONAZEPAM 0.25 MG PO TBDP: 0.2500 mg | ORAL_TABLET | Freq: Every day | ORAL | 0 refills | Status: DC | PRN

## 2023-09-17 NOTE — Telephone Encounter (Signed)
 Spoke w/ Pt- informed of recommendations. She verbalized understanding.

## 2023-09-17 NOTE — Telephone Encounter (Signed)
 Copied from CRM [id number]. Topic: General - Other >> Sep 17, 2023  9:24 AM Truddie Crumble wrote: Reason for CRM: patient called stating she is having oral surgery on Monday morning and she need a prescription for anxiety so she can get through the twenty minute surgery. Patient just need something that she can take just that day

## 2023-09-17 NOTE — Telephone Encounter (Signed)
 Call pt, I sent clonazepam take 1-2 tabs prior to surgery, inform her dentist regards this medicine. May cause drowsiness, needs a driver  === PDMP was ok

## 2023-09-17 NOTE — Telephone Encounter (Signed)
 Routing to DOD since PCP is out.

## 2023-09-17 NOTE — Addendum Note (Signed)
 Addended by: Willow Ora E on: 09/17/2023 05:01 PM   Modules accepted: Orders

## 2023-09-23 ENCOUNTER — Ambulatory Visit: Payer: Medicare Other

## 2023-09-23 VITALS — Ht 62.5 in | Wt 191.0 lb

## 2023-09-23 DIAGNOSIS — Z1211 Encounter for screening for malignant neoplasm of colon: Secondary | ICD-10-CM

## 2023-09-23 DIAGNOSIS — Z0000 Encounter for general adult medical examination without abnormal findings: Secondary | ICD-10-CM

## 2023-09-23 NOTE — Progress Notes (Signed)
 Subjective:   Candice Bennett is a 78 y.o. who presents for a Medicare Wellness preventive visit.  Visit Complete: Virtual I connected with  [name] on 09/23/23 by a audio enabled telemedicine application and verified that I am speaking with the correct person using two identifiers.  Patient Location: Home  Provider Location: Home Office  I discussed the limitations of evaluation and management by telemedicine. The patient expressed understanding and agreed to proceed.  Vital Signs: Because this visit was a virtual/telehealth visit, some criteria may be missing or patient reported. Any vitals not documented were not able to be obtained and vitals that have been documented are patient reported.    Persons Participating in Visit: Patient.  AWV Questionnaire: No: Patient Medicare AWV questionnaire was not completed prior to this visit.  Cardiac Risk Factors include: advanced age (>51men, >51 women);hypertension     Objective:    Today's Vitals   09/23/23 1557  Weight: 191 lb (86.6 kg)  Height: 5' 2.5" (1.588 m)   Body mass index is 34.38 kg/m.     09/23/2023    4:07 PM 09/17/2022   11:05 AM 08/19/2021    4:00 PM 05/22/2019   10:18 AM 05/16/2018    9:44 AM 05/10/2017    8:40 AM 08/18/2016    8:59 PM  Advanced Directives  Does Patient Have a Medical Advance Directive? Yes Yes Yes Yes Yes Yes Yes  Type of Estate agent of [place];Living will Healthcare Power of [place];Living will Healthcare Power of [employer] Power of [employer] Power of [place];Living will Healthcare Power of [place];Living will Healthcare Power of [place];Living will  Does patient want to make changes to medical advance directive? No - Patient declined No - Patient declined  No - Patient declined   No - Patient declined  Copy of Healthcare Power of Attorney in Chart? Yes - validated most recent copy scanned in chart (See row information) Yes - validated  most recent copy scanned in chart (See row information) Yes - validated most recent copy scanned in chart (See row information) Yes - validated most recent copy scanned in chart (See row information) Yes - validated most recent copy scanned in chart (See row information) Yes No - copy requested    Current Medications (verified) Outpatient Encounter Medications as of 09/23/2023  Medication Sig   amLODipine (NORVASC) 5 MG tablet Take 1 tablet (5 mg total) by mouth daily.   amoxicillin-clavulanate (AUGMENTIN) 875-125 MG tablet Take 1 tablet by mouth 2 (two) times daily. (Patient not taking: Reported on 09/23/2023)   aspirin EC 81 MG tablet Take 81 mg by mouth daily. Swallow whole.   cetirizine (ZYRTEC ALLERGY) 10 MG tablet Take 1 tablet (10 mg total) by mouth daily. (Patient not taking: Reported on 09/23/2023)   clonazePAM (KLONOPIN) 0.25 MG disintegrating tablet Take 1-2 tablets (0.25-0.5 mg total) by mouth daily as needed (prior to surgery). (Patient not taking: Reported on 09/23/2023)   diclofenac Sodium (VOLTAREN) 1 % GEL Apply 4 g topically 4 (four) times daily. (Patient not taking: Reported on 09/23/2023)   labetalol (NORMODYNE) 100 MG tablet Take 1 tablet (100 mg total) by mouth 2 (two) times daily.   mometasone (ELOCON) 0.1 % cream APPLY  CREAM TOPICALLY TO AFFECTED AREA(S) ONCE DAILY   predniSONE (DELTASONE) 10 MG tablet Take 4 tablets (40 mg total) by mouth daily with breakfast. (Patient not taking: Reported on 09/23/2023)   promethazine-dextromethorphan (PROMETHAZINE-DM) 6.25-15 MG/5ML syrup Take 2.5 mLs by mouth  3 (three) times daily as needed for cough. (Patient not taking: Reported on 09/23/2023)   No facility-administered encounter medications on file as of 09/23/2023.    Allergies (verified) Iohexol and Ivp dye [iodinated contrast media]   History: Past Medical History:  Diagnosis Date   Allergy 1976   Cataract    Follicular lymphoma (HCC)    OSA (obstructive sleep apnea)    Sarcoidosis     Sleep apnea 05/2003   Unspecified essential hypertension    Past Surgical History:  Procedure Laterality Date   ABDOMINAL HYSTERECTOMY  06/22/1981   ABDOMINAL HYSTERECTOMY  1984   CARPAL TUNNEL RELEASE     LYMPH NODE BIOPSY     port a cath insertion  06/23/2003   PORT-A-CATH REMOVAL  06/22/2004   TUBAL LIGATION  06/22/1976   Family History  Problem Relation Age of Onset   Heart disease Mother    Lung cancer Father    Heart attack Father    Heart failure Sister    Heart disease Sister 30       chf   Dementia Sister    Kidney disease Sister    Frontotemporal dementia Sister    Hypertension Sister    Multiple myeloma Sister    COPD Sister    Heart attack Sister 64   Bell's palsy Sister    Transient ischemic attack Sister    Diabetes Sister    Prostate cancer Brother    Diabetes Brother    Colon cancer Neg Hx    Colon polyps Neg Hx    Esophageal cancer Neg Hx    Stomach cancer Neg Hx    Rectal cancer Neg Hx    Social History   Socioeconomic History   Marital status: Divorced    Spouse name: Not on file   Number of children: 2   Years of education: Not on file   Highest education level: Bachelor's degree (e.g., BA, AB, BS)  Occupational History   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    Associate Professor: [employer]  Tobacco Use   Smoking status: Never   Smokeless tobacco: Never  Vaping Use   Vaping status: Never Used  Substance and Sexual Activity   Alcohol use: No   Drug use: No   Sexual activity: Not Currently    Partners: Male  Other Topics Concern   Not on file  Social History Narrative   No regularly exercise   Live with daughter and her daughter   Social Drivers of Corporate investment banker Strain: Low Risk  (09/23/2023)   Overall Financial Resource Strain (CARDIA)    Difficulty of Paying Living Expenses: Not hard at all  Food Insecurity: No Food Insecurity (09/23/2023)   Hunger Vital Sign    Worried About Running Out of Food in the Last  Year: Never true    Ran Out of Food in the Last Year: Never true  Transportation Needs: No Transportation Needs (09/23/2023)   PRAPARE - Administrator, Civil Service (Medical): No    Lack of Transportation (Non-Medical): No  Physical Activity: Inactive (09/23/2023)   Exercise Vital Sign    Days of Exercise per Week: 0 days    Minutes of Exercise per Session: 0 min  Stress: No Stress Concern Present (09/23/2023)   [employer] of Occupational Health - Occupational Stress Questionnaire    Feeling of Stress : Not at all  Social Connections: Moderately Integrated (09/23/2023)   Social Connection and Isolation  Panel [NHANES]    Frequency of Communication with Friends and Family: More than three times a week    Frequency of Social Gatherings with Friends and Family: More than three times a week    Attends Religious Services: More than 4 times per year    Active Member of [employer] or Organizations: Yes    Attends Engineer, structural: More than 4 times per year    Marital Status: Divorced    Tobacco Counseling Counseling given: Not Answered    Clinical Intake:  Pre-visit preparation completed: Yes  Pain : No/denies pain     BMI - recorded: 34.38 Nutritional Status: BMI > 30  Obese Nutritional Risks: None Diabetes: No  No results found for: "HGBA1C"   How often do you need to have someone help you when you read instructions, pamphlets, or other written materials from your doctor or pharmacy?: 1 - Never  Interpreter Needed?: No  Information entered by :: Theresa Mulligan LPN   Activities of Daily Living     09/23/2023    4:04 PM  In your present state of health, do you have any difficulty performing the following activities:  Hearing? 0  Vision? 0  Difficulty concentrating or making decisions? 0  Walking or climbing stairs? 0  Dressing or bathing? 0  Doing errands, shopping? 0  Preparing Food and eating ? N  Using the Toilet? N  In the past six months,  have you accidently leaked urine? N  Do you have problems with loss of bowel control? N  Managing your Medications? N  Managing your Finances? N  Housekeeping or managing your Housekeeping? N    Patient Care Team: Zola Button, Grayling Congress, DO as PCP - General Louis Meckel, MD (Inactive) as Consulting Physician (Gastroenterology)  Indicate any recent Medical Services you may have received from other than [facility] providers in the past year (date may be approximate).     Assessment:   This is a routine wellness examination for [place].  Hearing/Vision screen Hearing Screening - Comments:: Denies hearing difficulties   Vision Screening - Comments:: Wears rx glasses - up to date with routine eye exams with  [facility]   Goals Addressed               This Visit's Progress     Lose weight (pt-stated)        My would like to lose a few pounds.       Depression Screen      09/23/2023    4:04 PM 09/17/2022   11:09 AM 09/14/2022    2:01 PM 08/19/2021    3:57 PM 11/22/2020    8:59 AM 05/22/2019   10:24 AM 05/16/2018    9:47 AM  PHQ 2/9 Scores  PHQ - 2 Score 0 0 0 0 0 0 0    Fall Risk      09/23/2023    4:04 PM 09/16/2022   11:13 PM 09/14/2022    2:01 PM 08/19/2021    4:02 PM 11/22/2020    8:59 AM  Fall Risk   Falls in the past year? 1 1 0 0 0  Number falls in past yr: 0 1 0 0 0  Injury with Fall? 0 1 0 0 0  Risk for fall due to : No Fall Risks History of fall(s)  Impaired vision;Impaired balance/gait   Follow up Falls prevention discussed;Falls evaluation completed Falls evaluation completed Falls evaluation completed Falls prevention discussed Falls evaluation completed  MEDICARE RISK AT HOME:   Medicare Risk at Home Any stairs in or around the home?: Yes If so, are there any without handrails?: No Home free of loose throw rugs in walkways, pet beds, electrical cords, etc?: Yes Adequate lighting in your home to reduce risk of falls?: Yes Life alert?: No Use of a  cane, walker or w/c?: No Grab bars in the bathroom?: No Shower chair or bench in shower?: No Elevated toilet seat or a handicapped toilet?: Yes  TIMED UP AND GO:  Was the test performed?  No  Cognitive Function: 6CIT completed    05/10/2017    8:43 AM 05/08/2016   10:31 AM  MMSE - Mini Mental State Exam  Orientation to time 5 5  Orientation to Place 5 5  Registration 3 3  Attention/ Calculation 5 5  Recall 3 3  Language- name 2 objects 2 2  Language- repeat 1 1  Language- follow 3 step command 3 3  Language- read & follow direction 1 1  Write a sentence 1 1  Copy design 1 1  Total score 30 30        09/23/2023    4:07 PM 09/17/2022   11:13 AM 08/19/2021    4:04 PM  6CIT Screen  What Year? 0 points 0 points 0 points  What month? 0 points 0 points 0 points  What time? 0 points 0 points 0 points  Count back from 20 0 points 0 points 0 points  Months in reverse 0 points 0 points 0 points  Repeat phrase 0 points 0 points 0 points  Total Score 0 points 0 points 0 points    Immunizations Immunization History  Administered Date(s) Administered   Fluad Quad(high Dose 65+) 03/09/2019, 05/14/2020, 06/12/2021, 03/16/2022   Hepatitis B, ADULT 05/21/2020, 06/20/2020   Influenza Whole 03/26/2009   Influenza, High Dose Seasonal PF 02/25/2017, 05/16/2018   PFIZER(Purple Top)SARS-COV-2 Vaccination 11/29/2019, 12/24/2019   PPD Test 05/08/2020   Pneumococcal Conjugate-13 01/30/2014   Pneumococcal Polysaccharide-23 12/09/2012   Td 08/10/2000   Tdap 04/07/2011   Zoster, Live 02/10/2010    Screening Tests Health Maintenance  Topic Date Due   Zoster Vaccines- Shingrix (1 of 2) 08/15/1966   COVID-19 Vaccine (3 - Pfizer risk series) 01/21/2020   DTaP/Tdap/Td (3 - Td or Tdap) 04/06/2021   MAMMOGRAM  09/07/2023   INFLUENZA VACCINE  01/21/2024   DEXA SCAN  09/06/2024   Medicare Annual Wellness (AWV)  09/22/2024   Pneumonia Vaccine 46+ Years old  Completed   Hepatitis C Screening   Completed   HPV VACCINES  Aged Out   Colonoscopy  Discontinued    Health Maintenance  Health Maintenance Due  Topic Date Due   Zoster Vaccines- Shingrix (1 of 2) 08/15/1966   COVID-19 Vaccine (3 - Pfizer risk series) 01/21/2020   DTaP/Tdap/Td (3 - Td or Tdap) 04/06/2021   MAMMOGRAM  09/07/2023   Health Maintenance Items Addressed: Mammogram ordered  Additional Screening:  Vision Screening: Recommended annual ophthalmology exams for early detection of glaucoma and other disorders of the eye.  Dental Screening: Recommended annual dental exams for proper oral hygiene  Community Resource Referral / Chronic Care Management: CRR required this visit?  No   CCM required this visit?  No     Plan:     I have personally reviewed and noted the following in the patient's chart:   Medical and social history Use of alcohol, tobacco or illicit drugs  Current medications and supplements  including opioid prescriptions. Patient is not currently taking opioid prescriptions. Functional ability and status Nutritional status Physical activity Advanced directives List of other physicians Hospitalizations, surgeries, and ER visits in previous 12 months Vitals Screenings to include cognitive, depression, and falls Referrals and appointments  In addition, I have reviewed and discussed with patient certain preventive protocols, quality metrics, and best practice recommendations. A written personalized care plan for preventive services as well as general preventive health recommendations were provided to patient.     Tillie Rung, LPN   0/01/6577   After Visit Summary: (MyChart) Due to this being a telephonic visit, the after visit summary with patients personalized plan was offered to patient via MyChart   Notes: Nothing significant to report at this time.

## 2023-09-23 NOTE — Patient Instructions (Addendum)
 Ms. Candice Bennett , Thank you for taking time to come for your Medicare Wellness Visit. I appreciate your ongoing commitment to your health goals. Please review the following plan we discussed and let me know if I can assist you in the future.   Referrals/Orders/Follow-Ups/Clinician Recommendations:   This is a list of the screening recommended for you and due dates:  Health Maintenance  Topic Date Due   Zoster (Shingles) Vaccine (1 of 2) 08/15/1966   COVID-19 Vaccine (3 - Pfizer risk series) 01/21/2020   DTaP/Tdap/Td vaccine (3 - Td or Tdap) 04/06/2021   Mammogram  09/07/2023   Flu Shot  01/21/2024   DEXA scan (bone density measurement)  09/06/2024   Medicare Annual Wellness Visit  09/22/2024   Pneumonia Vaccine  Completed   Hepatitis C Screening  Completed   HPV Vaccine  Aged Out   Colon Cancer Screening  Discontinued    Advanced directives: (In Chart) A copy of your advanced directives are scanned into your chart should your provider ever need it.  Next Medicare Annual Wellness Visit scheduled for next year: Yes

## 2023-10-13 NOTE — Telephone Encounter (Signed)
Closing encounter.  Nothing further needed.

## 2023-11-24 DIAGNOSIS — H35373 Puckering of macula, bilateral: Secondary | ICD-10-CM | POA: Diagnosis not present

## 2023-11-24 DIAGNOSIS — H2513 Age-related nuclear cataract, bilateral: Secondary | ICD-10-CM | POA: Diagnosis not present

## 2023-11-24 DIAGNOSIS — H52223 Regular astigmatism, bilateral: Secondary | ICD-10-CM | POA: Diagnosis not present

## 2023-11-24 DIAGNOSIS — H524 Presbyopia: Secondary | ICD-10-CM | POA: Diagnosis not present

## 2023-11-24 DIAGNOSIS — H43812 Vitreous degeneration, left eye: Secondary | ICD-10-CM | POA: Diagnosis not present

## 2023-11-24 DIAGNOSIS — H5213 Myopia, bilateral: Secondary | ICD-10-CM | POA: Diagnosis not present

## 2023-11-26 ENCOUNTER — Other Ambulatory Visit: Payer: Self-pay | Admitting: Family Medicine

## 2023-11-26 DIAGNOSIS — I10 Essential (primary) hypertension: Secondary | ICD-10-CM

## 2023-12-09 ENCOUNTER — Other Ambulatory Visit: Payer: Self-pay | Admitting: Family Medicine

## 2023-12-09 DIAGNOSIS — I10 Essential (primary) hypertension: Secondary | ICD-10-CM

## 2023-12-10 ENCOUNTER — Other Ambulatory Visit: Payer: Self-pay | Admitting: Family Medicine

## 2023-12-10 NOTE — Telephone Encounter (Unsigned)
 Copied from CRM [id number]. Topic: Clinical - Medication Refill >> Dec 10, 2023  5:36 PM Shereese L wrote: Medication:labetalol  (NORMODYNE ) 100 MG tablet  Has the patient contacted their pharmacy? Yes (Agent: If no, request that the patient contact the pharmacy for the refill. If patient does not wish to contact the pharmacy document the reason why and proceed with request.) (Agent: If yes, when and what did the pharmacy advise?)  This is the patient's preferred pharmacy:  [facility] [address] ([address] [place] - [address] [place] ([place] [address] Phone: [phone] Fax: [phone]  Is this the correct pharmacy for this prescription? Yes If no, delete pharmacy and type the correct one.   Has the prescription been filled recently? Yes  Is the patient out of the medication? Yes  Has the patient been seen for an appointment in the last year OR does the patient have an upcoming appointment? Yes  Can we respond through MyChart? Yes  Agent: Please be advised that Rx refills may take up to 3 business days. We ask that you follow-up with your pharmacy.

## 2023-12-13 ENCOUNTER — Other Ambulatory Visit: Payer: Self-pay | Admitting: Family Medicine

## 2023-12-13 DIAGNOSIS — I10 Essential (primary) hypertension: Secondary | ICD-10-CM

## 2023-12-28 ENCOUNTER — Other Ambulatory Visit: Payer: Self-pay | Admitting: Family Medicine

## 2023-12-28 DIAGNOSIS — Z1231 Encounter for screening mammogram for malignant neoplasm of breast: Secondary | ICD-10-CM

## 2024-01-06 ENCOUNTER — Other Ambulatory Visit: Payer: Self-pay | Admitting: Family Medicine

## 2024-01-06 DIAGNOSIS — I10 Essential (primary) hypertension: Secondary | ICD-10-CM

## 2024-01-10 ENCOUNTER — Ambulatory Visit
Admission: RE | Admit: 2024-01-10 | Discharge: 2024-01-10 | Disposition: A | Discharge status: Home / Self Care | Source: Ambulatory Visit | Attending: Family Medicine | Admitting: Family Medicine

## 2024-01-10 DIAGNOSIS — Z1231 Encounter for screening mammogram for malignant neoplasm of breast: Secondary | ICD-10-CM | POA: Diagnosis not present

## 2024-01-31 ENCOUNTER — Other Ambulatory Visit: Payer: Self-pay | Admitting: Family Medicine

## 2024-01-31 DIAGNOSIS — L309 Dermatitis, unspecified: Secondary | ICD-10-CM

## 2024-02-04 ENCOUNTER — Other Ambulatory Visit: Payer: Self-pay | Admitting: Family Medicine

## 2024-02-04 DIAGNOSIS — I10 Essential (primary) hypertension: Secondary | ICD-10-CM

## 2024-03-01 ENCOUNTER — Other Ambulatory Visit: Payer: Self-pay | Admitting: Family Medicine

## 2024-03-01 DIAGNOSIS — I10 Essential (primary) hypertension: Secondary | ICD-10-CM

## 2024-03-09 ENCOUNTER — Other Ambulatory Visit: Payer: Self-pay | Admitting: Family Medicine

## 2024-03-09 DIAGNOSIS — I10 Essential (primary) hypertension: Secondary | ICD-10-CM

## 2024-03-09 NOTE — Telephone Encounter (Signed)
 Pt informed on 03/01/24 that she is overdue for appt. See below. Appt has not been scheduled. Rx denied.   Me to [name]     03/01/24  8:30 AM Good morning, Per our records you are due for a follow-up with Dr. Antonio. Please call the office to schedule an appointment at your earliest convenience.    Thank you, [facility]  Last read by [name] at 3:35PM on 03/01/2024.

## 2024-03-14 ENCOUNTER — Encounter: Payer: Self-pay | Admitting: Family Medicine

## 2024-03-14 ENCOUNTER — Ambulatory Visit: Admitting: Family Medicine

## 2024-03-14 VITALS — BP 120/78 | HR 74 | Temp 97.7°F | Resp 18 | Ht 62.5 in | Wt 197.4 lb

## 2024-03-14 DIAGNOSIS — E785 Hyperlipidemia, unspecified: Secondary | ICD-10-CM | POA: Diagnosis not present

## 2024-03-14 DIAGNOSIS — I10 Essential (primary) hypertension: Secondary | ICD-10-CM

## 2024-03-14 DIAGNOSIS — Z0000 Encounter for general adult medical examination without abnormal findings: Secondary | ICD-10-CM | POA: Insufficient documentation

## 2024-03-14 DIAGNOSIS — M778 Other enthesopathies, not elsewhere classified: Secondary | ICD-10-CM | POA: Diagnosis not present

## 2024-03-14 DIAGNOSIS — E538 Deficiency of other specified B group vitamins: Secondary | ICD-10-CM

## 2024-03-14 DIAGNOSIS — Z23 Encounter for immunization: Secondary | ICD-10-CM | POA: Diagnosis not present

## 2024-03-14 LAB — TSH: TSH: 1.63 u[IU]/mL (ref 0.35–5.50)

## 2024-03-14 LAB — CBC WITH DIFFERENTIAL/PLATELET
Basophils Absolute: 0.1 K/uL (ref 0.0–0.1)
Basophils Relative: 1.1 % (ref 0.0–3.0)
Eosinophils Absolute: 0.2 K/uL (ref 0.0–0.7)
Eosinophils Relative: 4.1 % (ref 0.0–5.0)
HCT: 36.6 % (ref 36.0–46.0)
Hemoglobin: 11.6 g/dL — ABNORMAL LOW (ref 12.0–15.0)
Lymphocytes Relative: 44.4 % (ref 12.0–46.0)
Lymphs Abs: 2.6 K/uL (ref 0.7–4.0)
MCHC: 31.7 g/dL (ref 30.0–36.0)
MCV: 80.1 fl (ref 78.0–100.0)
Monocytes Absolute: 0.4 K/uL (ref 0.1–1.0)
Monocytes Relative: 6.9 % (ref 3.0–12.0)
Neutro Abs: 2.6 K/uL (ref 1.4–7.7)
Neutrophils Relative %: 43.5 % (ref 43.0–77.0)
Platelets: 350 K/uL (ref 150.0–400.0)
RBC: 4.57 Mil/uL (ref 3.87–5.11)
RDW: 15.4 % (ref 11.5–15.5)
WBC: 5.9 K/uL (ref 4.0–10.5)

## 2024-03-14 LAB — LIPID PANEL
Cholesterol: 188 mg/dL (ref 0–200)
HDL: 65.6 mg/dL (ref >39.00)
LDL Cholesterol: 104 mg/dL — ABNORMAL HIGH (ref 0–99)
NonHDL: 122.15
Total CHOL/HDL Ratio: 3
Triglycerides: 91 mg/dL (ref 0.0–149.0)
VLDL: 18.2 mg/dL (ref 0.0–40.0)

## 2024-03-14 LAB — COMPREHENSIVE METABOLIC PANEL WITH GFR
ALT: 14 U/L (ref 0–35)
AST: 18 U/L (ref 0–37)
Albumin: 4.3 g/dL (ref 3.5–5.2)
Alkaline Phosphatase: 65 U/L (ref 39–117)
BUN: 11 mg/dL (ref 6–23)
CO2: 29 meq/L (ref 19–32)
Calcium: 9.6 mg/dL (ref 8.4–10.5)
Chloride: 103 meq/L (ref 96–112)
Creatinine, Ser: 0.79 mg/dL (ref 0.40–1.20)
GFR: 72.58 mL/min (ref >60.00)
Glucose, Bld: 94 mg/dL (ref 70–99)
Potassium: 4.2 meq/L (ref 3.5–5.1)
Sodium: 141 meq/L (ref 135–145)
Total Bilirubin: 0.6 mg/dL (ref 0.2–1.2)
Total Protein: 7.3 g/dL (ref 6.0–8.3)

## 2024-03-14 MED ORDER — PREDNISONE 10 MG PO TABS: ORAL_TABLET | ORAL | 0 refills | Status: DC

## 2024-03-14 NOTE — Assessment & Plan Note (Signed)
 Well controlled, no changes to meds. Encouraged heart healthy diet such as the DASH diet and exercise as tolerated.

## 2024-03-14 NOTE — Progress Notes (Signed)
 Subjective:    Patient ID: Candice Bennett, female    DOB: [birth year], 78 y.o.   MRN: [id number]  Chief Complaint  Patient presents with   Hand Pain    Right hand thumb, no falls or injuries, having pain, some swelling.     HPI Patient is in today for thumb pain and cpe.  Discussed the use of AI scribe software for clinical note transcription with the patient, who gave verbal consent to proceed.  History of Present Illness Candice Bennett is a 78 year old female who presents with right thumb pain and stiffness.  She has been experiencing pain and stiffness in her right thumb for over a month. The pain is primarily located at the base of the thumb and sometimes extends to the joint, causing stiffness, especially during tasks like opening a bottle or pulling a plug from the wall. There is a sensation of 'snapping' in the joint when bending the thumb, although it does not produce an audible sound.  The pain is exacerbated by activities requiring pressure or strength in the thumb, such as using a computer mouse or holding a phone. It is not constant but occurs mainly during use or when changing positions. Occasionally, she experiences a burning sensation and has noticed warmth in the area at times.  She has tried using Tylenol Arthritis for relief, but it has not significantly alleviated the symptoms. She has not used Voltaren  gel recently but recalls it being recommended for a previous knee issue.  Her past medical history includes carpal tunnel surgery on the opposite hand in 2004 and arthroscopic knee surgery with cortisone injections. She mentions a history of cancer and previous use of prednisone  during treatment.  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 She has recently acquired a new phone, which she suspects may contribute to her symptoms due to its size and weight.  No new moles or concerning skin changes.  Occasional knee pain and general fatigue, questioning possible anemia. No other joint issues reported aside from the thumb and knee.    Past Medical History:  Diagnosis Date   Allergy 1976   Cataract    Follicular lymphoma (HCC)    OSA (obstructive sleep apnea)    Sarcoidosis    Sleep apnea 05/2003   Unspecified essential hypertension     Past Surgical History:  Procedure Laterality Date   ABDOMINAL HYSTERECTOMY  06/22/1981   ABDOMINAL HYSTERECTOMY  1984   CARPAL TUNNEL RELEASE     LYMPH NODE BIOPSY     port a cath insertion  06/23/2003   PORT-A-CATH REMOVAL  06/22/2004   TUBAL LIGATION  06/22/1976    Family History  Problem Relation Age of Onset   Heart disease Mother    Lung cancer Father    Heart attack Father    Heart failure Sister    Heart disease Sister 27       chf   Dementia Sister    Kidney disease Sister    Frontotemporal dementia Sister    Hypertension Sister    Multiple myeloma Sister    COPD Sister    Heart attack Sister 47   Bell's palsy Sister    Transient ischemic attack Sister    Diabetes Sister    Prostate cancer Brother    Diabetes Brother    Colon cancer Neg Hx    Colon polyps Neg Hx    Esophageal cancer Neg Hx  Stomach cancer Neg Hx    Rectal cancer Neg Hx     Social History   Socioeconomic History   Marital status: Divorced    Spouse name: Not on file   Number of children: 2   Years of education: Not on file   Highest education level: Bachelor's degree (e.g., BA, AB, BS)  Occupational History   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    Associate Professor: [employer]  Tobacco Use   Smoking status: Never   Smokeless tobacco: Never  Vaping Use   Vaping status: Never Used  Substance and Sexual Activity   Alcohol use: No   Drug use: No   Sexual activity: Not Currently    Partners: Male  Other Topics Concern   Not on file  Social History Narrative   No regularly exercise   Live with daughter and her daughter   Social  Drivers of Corporate investment banker Strain: Low Risk  (09/23/2023)   Overall Financial Resource Strain (CARDIA)    Difficulty of Paying Living Expenses: Not hard at all  Food Insecurity: No Food Insecurity (09/23/2023)   Hunger Vital Sign    Worried About Running Out of Food in the Last Year: Never true    Ran Out of Food in the Last Year: Never true  Transportation Needs: No Transportation Needs (09/23/2023)   PRAPARE - Administrator, Civil Service (Medical): No    Lack of Transportation (Non-Medical): No  Physical Activity: Inactive (09/23/2023)   Exercise Vital Sign    Days of Exercise per Week: 0 days    Minutes of Exercise per Session: 0 min  Stress: No Stress Concern Present (09/23/2023)   [employer] of Occupational Health - Occupational Stress Questionnaire    Feeling of Stress : Not at all  Social Connections: Moderately Integrated (09/23/2023)   Social Connection and Isolation Panel    Frequency of Communication with Friends and Family: More than three times a week    Frequency of Social Gatherings with Friends and Family: More than three times a week    Attends Religious Services: More than 4 times per year    Active Member of [employer] or Organizations: Yes    Attends Engineer, structural: More than 4 times per year    Marital Status: Divorced  Intimate Partner Violence: Not At Risk (09/23/2023)   Humiliation, Afraid, Rape, and Kick questionnaire    Fear of Current or Ex-Partner: No    Emotionally Abused: No    Physically Abused: No    Sexually Abused: No    Outpatient Medications Prior to Visit  Medication Sig Dispense Refill   amLODipine  (NORVASC ) 5 MG tablet Take 1 tablet (5 mg total) by mouth daily. Needs appt 30 tablet 0   aspirin EC 81 MG tablet Take 81 mg by mouth daily. Swallow whole.     labetalol  (NORMODYNE ) 100 MG tablet TAKE 1 TABLET BY MOUTH TWICE DAILY . APPOINTMENT REQUIRED FOR FUTURE REFILLS 60 tablet 0   mometasone  (ELOCON ) 0.1 % cream  APPLY  CREAM TOPICALLY TO AFFECTED AREA ONCE DAILY 15 g 2   predniSONE  (DELTASONE ) 10 MG tablet Take 4 tablets (40 mg total) by mouth daily with breakfast. 20 tablet 0   cetirizine  (ZYRTEC  ALLERGY) 10 MG tablet Take 1 tablet (10 mg total) by mouth daily. (Patient not taking: Reported on 09/23/2023) 30 tablet 0   clonazePAM  (KLONOPIN ) 0.25 MG disintegrating tablet Take 1-2 tablets (0.25-0.5 mg total) by  mouth daily as needed (prior to surgery). (Patient not taking: Reported on 09/23/2023) 2 tablet 0   diclofenac  Sodium (VOLTAREN ) 1 % GEL Apply 4 g topically 4 (four) times daily. (Patient not taking: Reported on 09/23/2023) 50 g 3   amoxicillin -clavulanate (AUGMENTIN ) 875-125 MG tablet Take 1 tablet by mouth 2 (two) times daily. (Patient not taking: Reported on 03/14/2024) 14 tablet 0   promethazine -dextromethorphan (PROMETHAZINE -DM) 6.25-15 MG/5ML syrup Take 2.5 mLs by mouth 3 (three) times daily as needed for cough. (Patient not taking: Reported on 03/14/2024) 100 mL 0   No facility-administered medications prior to visit.    Allergies  Allergen Reactions   Iohexol  Itching   Ivp Dye [Iodinated Contrast Media] Itching    Review of Systems  Constitutional:  Negative for chills, fever and malaise/fatigue.  HENT:  Negative for congestion and hearing loss.   Eyes:  Negative for discharge.  Respiratory:  Negative for cough, sputum production and shortness of breath.   Cardiovascular:  Negative for chest pain, palpitations and leg swelling.  Gastrointestinal:  Negative for abdominal pain, blood in stool, constipation, diarrhea, heartburn, nausea and vomiting.  Genitourinary:  Negative for dysuria, frequency, hematuria and urgency.  Musculoskeletal:  Positive for joint pain. Negative for back pain, falls and myalgias.  Skin:  Negative for rash.  Neurological:  Negative for dizziness, sensory change, loss of consciousness, weakness and headaches.  Endo/Heme/Allergies:  Negative for environmental  allergies. Does not bruise/bleed easily.  Psychiatric/Behavioral:  Negative for depression and suicidal ideas. The patient is not nervous/anxious and does not have insomnia.        Objective:    Physical Exam Vitals and nursing note reviewed.  Constitutional:      General: She is not in acute distress.    Appearance: Normal appearance. She is well-developed.  HENT:     Head: Normocephalic and atraumatic.     Right Ear: Tympanic membrane, ear canal and external ear normal. There is no impacted cerumen.     Left Ear: Tympanic membrane, ear canal and external ear normal. There is no impacted cerumen.     Nose: Nose normal.     Mouth/Throat:     Mouth: Mucous membranes are moist.     Pharynx: Oropharynx is clear. No oropharyngeal exudate or posterior oropharyngeal erythema.  Eyes:     General: No scleral icterus.       Right eye: No discharge.        Left eye: No discharge.     Conjunctiva/sclera: Conjunctivae normal.     Pupils: Pupils are equal, round, and reactive to light.  Neck:     Thyroid : No thyromegaly or thyroid  tenderness.     Vascular: No JVD.  Cardiovascular:     Rate and Rhythm: Normal rate and regular rhythm.     Heart sounds: Normal heart sounds. No murmur heard. Pulmonary:     Effort: Pulmonary effort is normal. No respiratory distress.     Breath sounds: Normal breath sounds.  Abdominal:     General: Bowel sounds are normal. There is no distension.     Palpations: Abdomen is soft. There is no mass.     Tenderness: There is no abdominal tenderness. There is no guarding or rebound.  Musculoskeletal:     Right hand: Deformity and tenderness present. Decreased range of motion. Decreased strength.     Cervical back: Normal range of motion and neck supple.     Right lower leg: No edema.     Left lower  leg: No edema.     Comments: R thumb----  pain thenar eminence + swelling   Lymphadenopathy:     Cervical: No cervical adenopathy.  Skin:    General: Skin is warm  and dry.     Findings: No erythema or rash.  Neurological:     Mental Status: She is alert and oriented to person, place, and time.     Cranial Nerves: No cranial nerve deficit.     Deep Tendon Reflexes: Reflexes are normal and symmetric.  Psychiatric:        Mood and Affect: Mood normal.        Behavior: Behavior normal.        Thought Content: Thought content normal.        Judgment: Judgment normal.     BP 120/78 (BP Location: Right Arm, Patient Position: Sitting, Cuff Size: Large)   Pulse 74   Temp 97.7 F (36.5 C) (Oral)   Resp 18   Ht 5' 2.5 (1.588 m)   Wt 197 lb 6.4 oz (89.5 kg)   SpO2 97%   BMI 35.53 kg/m  Wt Readings from Last 3 Encounters:  03/14/24 197 lb 6.4 oz (89.5 kg)  09/23/23 191 lb (86.6 kg)  08/03/23 199 lb 3.2 oz (90.4 kg)    Diabetic Foot Exam - Simple   No data filed    Lab Results  Component Value Date   WBC 6.0 03/16/2022   HGB 11.6 (L) 03/16/2022   HCT 36.3 03/16/2022   PLT 336.0 03/16/2022   GLUCOSE 100 (H) 03/16/2022   CHOL 166 03/16/2022   TRIG 94.0 03/16/2022   HDL 59.90 03/16/2022   LDLCALC 87 03/16/2022   ALT 10 03/16/2022   AST 14 03/16/2022   NA 140 03/16/2022   K 4.3 03/16/2022   CL 103 03/16/2022   CREATININE 0.88 03/16/2022   BUN 10 03/16/2022   CO2 30 03/16/2022   TSH 0.86 04/07/2011    Lab Results  Component Value Date   TSH 0.86 04/07/2011   Lab Results  Component Value Date   WBC 6.0 03/16/2022   HGB 11.6 (L) 03/16/2022   HCT 36.3 03/16/2022   MCV 81.2 03/16/2022   PLT 336.0 03/16/2022   Lab Results  Component Value Date   NA 140 03/16/2022   K 4.3 03/16/2022   CO2 30 03/16/2022   GLUCOSE 100 (H) 03/16/2022   BUN 10 03/16/2022   CREATININE 0.88 03/16/2022   BILITOT 0.4 03/16/2022   ALKPHOS 67 03/16/2022   AST 14 03/16/2022   ALT 10 03/16/2022   PROT 7.3 03/16/2022   ALBUMIN 4.1 03/16/2022   CALCIUM 9.2 03/16/2022   GFR 64.67 03/16/2022   Lab Results  Component Value Date   CHOL 166  03/16/2022   Lab Results  Component Value Date   HDL 59.90 03/16/2022   Lab Results  Component Value Date   LDLCALC 87 03/16/2022   Lab Results  Component Value Date   TRIG 94.0 03/16/2022   Lab Results  Component Value Date   CHOLHDL 3 03/16/2022   No results found for: HGBA1C     Assessment & Plan:  Preventative health care Assessment & Plan: Ghm utd Check labs  See AVS Health Maintenance  Topic Date Due   Zoster Vaccines- Shingrix (1 of 2) 08/15/1966   COVID-19 Vaccine (3 - Pfizer risk series) 01/21/2020   DTaP/Tdap/Td (3 - Td or Tdap) 04/06/2021   Influenza Vaccine  01/21/2024   DEXA SCAN  09/06/2024   Medicare Annual Wellness (AWV)  09/22/2024   Mammogram  01/09/2025   Pneumococcal Vaccine: 50+ Years  Completed   Hepatitis C Screening  Completed   HPV VACCINES  Aged Out   Meningococcal B Vaccine  Aged Out   Hepatitis B Vaccines 19-59 Average Risk  Discontinued   Colonoscopy  Discontinued      Thumb tendonitis Assessment & Plan: Splint  Ibuprofen/ tylenol Hand specialist if no improvement   Orders: -     predniSONE ; TAKE 3 TABLETS PO QD FOR 3 DAYS THEN TAKE 2 TABLETS PO QD FOR 3 DAYS THEN TAKE 1 TABLET PO QD FOR 3 DAYS THEN TAKE 1/2 TAB PO QD FOR 3 DAYS  Dispense: 20 tablet; Refill: 0  Need for influenza vaccination -     Flu vaccine HIGH DOSE PF(Fluzone Trivalent)  Essential hypertension Assessment & Plan: Well controlled, no changes to meds. Encouraged heart healthy diet such as the DASH diet and exercise as tolerated.    Orders: -     CBC with Differential/Platelet -     TSH  Hyperlipidemia LDL goal <100 Assessment & Plan: Encourage heart healthy diet such as MIND or DASH diet, increase exercise, avoid trans fats, simple carbohydrates and processed foods, consider a krill or fish or flaxseed oil cap daily.    Orders: -     Comprehensive metabolic panel with GFR -     Lipid panel -     TSH  Vitamin B 12 deficiency Assessment &  Plan: Check labs    Assessment and Plan Assessment & Plan Right thumb osteoarthritis   Chronic right thumb pain and swelling, likely due to osteoarthritis, presents with stiffness, snapping sensation, and burning pain, worsened by use and certain positions. Mild swelling is noted. Tylenol arthritis has been ineffective. Use Voltaren  gel topically on the thumb. Prescribe a prednisone  taper to reduce inflammation. Obtain a thumb splint from a pharmacy to immobilize the joint and allow rest. Consider referral to a hand specialist if symptoms persist. Use ice packs for pain relief. Tylenol arthritis may be used in conjunction with other treatments.  Right knee osteoarthritis   Intermittent right knee pain with a history of arthroscopic surgery and cortisone injections. Symptoms are mild and not the primary concern. Use Voltaren  gel topically on the knee.    Ayden Apodaca R Lowne Chase, DO

## 2024-03-14 NOTE — Assessment & Plan Note (Signed)
 Check labs

## 2024-03-14 NOTE — Assessment & Plan Note (Signed)
 Splint  Ibuprofen/ tylenol Hand specialist if no improvement

## 2024-03-14 NOTE — Assessment & Plan Note (Signed)
 Encourage heart healthy diet such as MIND or DASH diet, increase exercise, avoid trans fats, simple carbohydrates and processed foods, consider a krill or fish or flaxseed oil cap daily.

## 2024-03-14 NOTE — Assessment & Plan Note (Signed)
 Ghm utd Check labs  See AVS Health Maintenance  Topic Date Due   Zoster Vaccines- Shingrix (1 of 2) 08/15/1966   COVID-19 Vaccine (3 - Pfizer risk series) 01/21/2020   DTaP/Tdap/Td (3 - Td or Tdap) 04/06/2021   Influenza Vaccine  01/21/2024   DEXA SCAN  09/06/2024   Medicare Annual Wellness (AWV)  09/22/2024   Mammogram  01/09/2025   Pneumococcal Vaccine: 50+ Years  Completed   Hepatitis C Screening  Completed   HPV VACCINES  Aged Out   Meningococcal B Vaccine  Aged Out   Hepatitis B Vaccines 19-59 Average Risk  Discontinued   Colonoscopy  Discontinued

## 2024-03-15 ENCOUNTER — Other Ambulatory Visit: Payer: Self-pay | Admitting: Family Medicine

## 2024-03-15 DIAGNOSIS — I10 Essential (primary) hypertension: Secondary | ICD-10-CM

## 2024-03-19 ENCOUNTER — Ambulatory Visit: Payer: Self-pay | Admitting: Family Medicine

## 2024-03-22 ENCOUNTER — Ambulatory Visit: Payer: Self-pay

## 2024-03-22 NOTE — Telephone Encounter (Signed)
 FYI Only or Action Required?: Action required by provider: lab or test result follow-up needed.  Patient was last seen in primary care on 03/14/2024 by Antonio Meth, Jamee SAUNDERS, DO.  Called Nurse Triage reporting Results.  Symptoms began several days ago.  Interventions attempted: Other: no triage call.  Symptoms are: no triage call.  Triage Disposition: Call PCP Within 24 Hours  Patient/caregiver understands and will follow disposition?: No, wishes to speak with PCP   Copied from CRM #[id number]. Topic: Clinical - Lab/Test Results >> Mar 22, 2024 10:12 AM Charolett L wrote: Reason for CRM: Patient stated that shes calling In for the 2nd time and wanted a nurse to go over her labs with her. 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 Reason for Disposition  [1] Caller requests to speak ONLY to PCP AND [2] NON-URGENT question  Answer Assessment - Initial Assessment Questions 1. REASON FOR CALL or QUESTION: What is your reason for calling today? or How can I best      Patient called to review lab results. She also called 03/20/24 but never received follow up. She states she read message from pcp that labs look good' but when [facility] reviewed values she noted abnormal or near out of range levels. She is requesting for pcp to review her hgb, neutrophils, lymphocyte, basophils, RDW. She is concerned due to history of non hodgkins lymphoma that she will need additional work up. She is experiencing some fatigue recently.   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

## 2024-03-22 NOTE — Telephone Encounter (Signed)
 Pt had abnormal results for hemoglobin and LDL. Pt did view via Mychart but had questions on how to improve results

## 2024-03-23 NOTE — Telephone Encounter (Signed)
 Pt called. LVM to return call

## 2024-03-24 ENCOUNTER — Telehealth: Payer: Self-pay

## 2024-03-24 NOTE — Telephone Encounter (Signed)
Spoke with patient. Discussed labs

## 2024-03-24 NOTE — Telephone Encounter (Signed)
 Copied from CRM #[id number]. Topic: Clinical - Lab/Test Results >> Mar 22, 2024 10:12 AM Candice Bennett wrote: Reason for CRM: Patient stated that shes calling In for the 2nd time and wanted a nurse to go over her labs with her. 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 >> Mar 23, 2024  3:27 PM Candice Bennett wrote: Patient is calling in regarding the lab results, CAL advised me to read the results but the patient stated she already had the results she just had some questions and concerns to clarify with the nurse. Please contact the patient back.

## 2024-03-27 ENCOUNTER — Other Ambulatory Visit: Payer: Self-pay | Admitting: Family Medicine

## 2024-03-27 DIAGNOSIS — I10 Essential (primary) hypertension: Secondary | ICD-10-CM

## 2024-05-01 ENCOUNTER — Telehealth: Payer: Self-pay | Admitting: *Deleted

## 2024-05-01 ENCOUNTER — Ambulatory Visit (INDEPENDENT_AMBULATORY_CARE_PROVIDER_SITE_OTHER): Admitting: Adult Health

## 2024-05-01 ENCOUNTER — Encounter: Payer: Self-pay | Admitting: Adult Health

## 2024-05-01 VITALS — BP 120/68 | HR 81 | Temp 97.6°F | Ht 62.0 in | Wt 199.0 lb

## 2024-05-01 DIAGNOSIS — G4733 Obstructive sleep apnea (adult) (pediatric): Secondary | ICD-10-CM

## 2024-05-01 DIAGNOSIS — Z6836 Body mass index (BMI) 36.0-36.9, adult: Secondary | ICD-10-CM | POA: Diagnosis not present

## 2024-05-01 DIAGNOSIS — I10 Essential (primary) hypertension: Secondary | ICD-10-CM

## 2024-05-01 NOTE — Telephone Encounter (Signed)
 Pt advised to bring machine in [facility]   Copied from CRM #[id number]. Topic: General - Other >> May 01, 2024 12:09 PM Leila BROCKS wrote: Patient [phone] is returning Kaiya's call regarding cpap machine. Per CAL, CMA is unavailable and send a message. Patient states is generally poor using the cpap machine and needed a replacement part, the order was not approved until patient does a follow up with NP, Parrett. Also, does patient need to bring her cpap machine for the office visit for today a 2:30 pm? Please call back.

## 2024-05-01 NOTE — Progress Notes (Signed)
 @Patient  ID: Candice Bennett, female    DOB: 1945/11/09, 78 y.o.   MRN: [id number]  Chief Complaint  Patient presents with   Obstructive Sleep Apnea    Cpap issues     Referring provider: Antonio Cyndee Jamee JONELLE, *  HPI: 78 yo female followed for OSA Diagnosed with sleep apnea in 2004, difficult to tolerate. 2018 Started CPAP-unable to tolerate CPAP. Oct 2023 started back on CPAP.     TEST/EVENTS : Reviewed 05/01/2024  Home sleep study March 25, 2022 showed moderate sleep apnea with AHI at 18.9/hour and SPO2 low at 81%.    Sleep study 07/2016 AHI 14/hr     Discussed the use of AI scribe software for clinical note transcription with the patient, who gave verbal consent to proceed.  History of Present Illness Candice Bennett is a 78 year old female with moderate sleep apnea who presents for a follow-up regarding CPAP usage.  She has been experiencing air leakage with her CPAP machine during the night, which began about a month ago, leading her to discontinue its use for at least a month. Recently, she resumed using the CPAP for the last three nights, averaging about four hours per night.  Her history of sleep apnea dates back to 2004, with previous unsuccessful attempts to use CPAP in 2004 and 2018 due to intolerance. In 2023, she underwent a sleep study and resumed CPAP therapy.   She has been using the same CPAP mask since her last visit two years ago and has not been consistent with its use, sometimes going a week without it. She has not replaced the mask, although it does not appear worn out. It is a full face mask and very uncomfortable.   Has daytime sleepiness and fatigue.  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  She has no history of thyroid  cancer,MEN, pancreatitis, liver disease, or kidney disease. She has a history of lymphoma diagnosed in 2005, which is in remission, and sarcoidosis diagnosed  around the same time, with no current symptoms.  She has never smoked, and her last chest x-ray from two years ago was normal.     Allergies  Allergen Reactions   Iohexol  Itching   Ivp Dye [Iodinated Contrast Media] Itching    Immunization History  Administered Date(s) Administered   Fluad Quad(high Dose 65+) 03/09/2019, 05/14/2020, 06/12/2021, 03/16/2022   Hepatitis B, ADULT 05/21/2020, 06/20/2020   INFLUENZA, HIGH DOSE SEASONAL PF 02/25/2017, 05/16/2018, 03/14/2024   Influenza Whole 03/26/2009   PFIZER(Purple Top)SARS-COV-2 Vaccination 11/29/2019, 12/24/2019   PPD Test 05/08/2020   Pneumococcal Conjugate-13 01/30/2014   Pneumococcal Polysaccharide-23 12/09/2012   Td 08/10/2000   Tdap 04/07/2011   Zoster, Live 02/10/2010    Past Medical History:  Diagnosis Date   Allergy 1976   Cataract    Follicular lymphoma (HCC)    OSA (obstructive sleep apnea)    Sarcoidosis    Sleep apnea 05/2003   Unspecified essential hypertension     Tobacco History: Social History   Tobacco Use  Smoking Status Never   Passive exposure: Past  Smokeless Tobacco Never   Counseling given: Not Answered   Outpatient Medications Prior to Visit  Medication Sig Dispense Refill   amLODipine  (NORVASC ) 5 MG tablet Take 1 tablet (5 mg total) by mouth daily. 90 tablet 1   aspirin EC 81 MG tablet Take 81 mg by mouth daily. Swallow whole.  labetalol  (NORMODYNE ) 100 MG tablet Take 1 tablet (100 mg total) by mouth 2 (two) times daily. 180 tablet 1   mometasone  (ELOCON ) 0.1 % cream APPLY  CREAM TOPICALLY TO AFFECTED AREA ONCE DAILY 15 g 2   predniSONE  (DELTASONE ) 10 MG tablet Take 4 tablets (40 mg total) by mouth daily with breakfast. 20 tablet 0   predniSONE  (DELTASONE ) 10 MG tablet TAKE 3 TABLETS PO QD FOR 3 DAYS THEN TAKE 2 TABLETS PO QD FOR 3 DAYS THEN TAKE 1 TABLET PO QD FOR 3 DAYS THEN TAKE 1/2 TAB PO QD FOR 3 DAYS 20 tablet 0   No facility-administered medications prior to visit.      Review of Systems:   Constitutional:   No  weight loss, night sweats,  Fevers, chills, +fatigue, or  lassitude.  HEENT:   No headaches,  Difficulty swallowing,  Tooth/dental problems, or  Sore throat,                No sneezing, itching, ear ache, nasal congestion, post nasal drip,   CV:  No chest pain,  Orthopnea, PND, swelling in lower extremities, anasarca, dizziness, palpitations, syncope.   GI  No heartburn, indigestion, abdominal pain, nausea, vomiting, diarrhea, change in bowel habits, loss of appetite, bloody stools.   Resp: No shortness of breath with exertion or at rest.  No excess mucus, no productive cough,  No non-productive cough,  No coughing up of blood.  No change in color of mucus.  No wheezing.  No chest wall deformity  Skin: no rash or lesions.  GU: no dysuria, change in color of urine, no urgency or frequency.  No flank pain, no hematuria   MS:  No joint pain or swelling.  No decreased range of motion.  No back pain.    Physical Exam  BP 120/68   Pulse 81   Temp 97.6 F (36.4 C) (Oral)   Ht 5' 2 (1.575 m)   Wt 199 lb (90.3 kg)   SpO2 100%   BMI 36.40 kg/m   GEN: A/Ox3; pleasant , NAD, well nourished    HEENT:  [place]/AT, NOSE-clear, THROAT-clear, no lesions, no postnasal drip or exudate noted.   NECK:  Supple w/ fair ROM; no JVD; normal carotid impulses w/o bruits; no thyromegaly or nodules palpated; no lymphadenopathy.    RESP  Clear  P & A; w/o, wheezes/ rales/ or rhonchi. no accessory muscle use, no dullness to percussion  CARD:  RRR, no m/r/g, no peripheral edema, pulses intact, no cyanosis or clubbing.  GI:   Soft & nt; nml bowel sounds; no organomegaly or masses detected.   Musco: Warm bil, no deformities or joint swelling noted.   Neuro: alert, no focal deficits noted.    Skin: Warm, no lesions or rashes    Lab Results:Reviewed 05/01/2024   CBC    Component Value Date/Time   WBC 5.9 03/14/2024 0948   RBC 4.57 03/14/2024 0948    HGB 11.6 (L) 03/14/2024 0948   HGB 12.7 03/10/2011 1450   HCT 36.6 03/14/2024 0948   HCT 39.0 03/10/2011 1450   PLT 350.0 03/14/2024 0948   PLT 347 03/10/2011 1450   MCV 80.1 03/14/2024 0948   MCV 82.3 03/10/2011 1450   MCH 24.9 (L) 02/07/2021 1614   MCHC 31.7 03/14/2024 0948   RDW 15.4 03/14/2024 0948   RDW 14.6 (H) 03/10/2011 1450   LYMPHSABS 2.6 03/14/2024 0948   LYMPHSABS 1.9 03/10/2011 1450   MONOABS 0.4 03/14/2024 0948  MONOABS 0.1 03/10/2011 1450   EOSABS 0.2 03/14/2024 0948   EOSABS 0.0 03/10/2011 1450   BASOSABS 0.1 03/14/2024 0948   BASOSABS 0.0 03/10/2011 1450    BMET    Component Value Date/Time   NA 141 03/14/2024 0948   NA 143 03/10/2011 1450   K 4.2 03/14/2024 0948   K 4.3 03/10/2011 1450   CL 103 03/14/2024 0948   CL 103 03/10/2011 1450   CO2 29 03/14/2024 0948   CO2 27 03/10/2011 1450   GLUCOSE 94 03/14/2024 0948   GLUCOSE 128 (H) 03/10/2011 1450   BUN 11 03/14/2024 0948   BUN 12 03/10/2011 1450   CREATININE 0.79 03/14/2024 0948   CREATININE 0.74 02/07/2021 1614   CALCIUM 9.6 03/14/2024 0948   CALCIUM 9.5 03/10/2011 1450   GFRNONAA 112.67 11/06/2009 0822   GFRAA 94 09/16/2007 1354    BNP No results found for: BNP  ProBNP No results found for: PROBNP  Imaging: No results found.  Administration History     None           No data to display          No results found for: NITRICOXIDE     01/26/2022   10:00 AM 07/03/2016    3:00 PM  Results of the [facility] flowsheet  Sitting and reading 1 2  Watching TV 1 2  Sitting, inactive in a public place (e.g. a theatre or a meeting) 1 2  As a passenger in a car for an hour without a break 2 1  Lying down to rest in the afternoon when circumstances permit 2 1  Sitting and talking to someone 0 0  Sitting quietly after a lunch without alcohol 0 1  In a car, while stopped for a few minutes in traffic 3 2  Total score 10 11        Assessment & Plan:   Assessment and  Plan Assessment & Plan Obstructive sleep apnea, moderate- trouble tolerating CPAP.  Moderate obstructive sleep apnea with an apnea-hypopnea index of 19 events per hour. CPAP therapy has been inconsistent due to mask leakage and discomfort. Recent use of CPAP for three nights showed significant improvement in apnea events, indicating effective therapy when used. She is not a candidate for implant therapy due to personal preference against surgical intervention. Discussed alternative CPAP mask options to improve comfort and adherence. Provided ResMed N30I nasal mask for trial use. Encouraged consistent CPAP use, aiming for at least four hours per night. Discussed potential for weight loss as an adjunctive therapy to improve sleep apnea symptoms.  Overweight  -BMI 36 Her weight status contributes to obstructive sleep apnea. Discussed potential benefits of weight loss on sleep apnea symptoms. Consideration of GLP-1 agonist therapy (tirzepatide/Zepbound) for weight loss, with potential side effects including nausea, vomiting, diarrhea, and constipation. She is considering the cost and insurance coverage implications of Zepbound. Encouraged lifestyle modifications including diet and exercise to support weight loss.  Patient has moderate  sleep apnea related to obesity with BMI >/30 posing significant cardiovascular risks. Patient has failed traditional weight loss measures with caloric deficit and consistent exercise of 150 min/week for >/6 months. Patient will be initiated on Zepbound (tirzepatide) for weight management. Zepbound is the only pharmaceutical treatment approved for moderate-to-severe OSA in adults who are overweight (BMI >/27) or obese (BMI >/30). The patient will continue lifestyle modifications, including structured nutrition and physical activity as directed. No other GLP1 therapy will be used simultaneously at this time.  The patient does not have any FDA labeled contraindications to this agent,  including pregnancy, lactation, hx or family history of medullary thyroid  cancer, or multiple endocrine neoplasia type II. Side effect profile has been reviewed with patient. Aware of red flag symptoms to notify of immediately or seek emergency care, including severe nausea/vomiting, inability to pass bowels or gas, severe abdominal pain/tenderness, jaundice.    HTN-controlled  Continue follow-up with primary care and current maintenance regimen      Douglas Rooks, NP 05/01/2024  I spent 32   minutes dedicated to the care of this patient on the date of this encounter to include pre-visit review of records, face-to-face time with the patient discussing conditions above, post visit ordering of testing, clinical documentation with the electronic health record, making appropriate referrals as documented, and communicating necessary findings to members of the patients care team.

## 2024-05-01 NOTE — Patient Instructions (Signed)
 Restart CPAP At bedtime  , wear all night long for greater than 6hrs . Try Resmed N30i nasal mask.  Do not drive if sleepy  Remain active and work on weight loss.  Call back if you change your mind on Zepbound.  Follow up in 6 months and As needed

## 2024-09-11 ENCOUNTER — Ambulatory Visit: Admitting: Family Medicine

## 2024-09-28 ENCOUNTER — Ambulatory Visit
